# Patient Record
Sex: Female | Born: 1955
Health system: Southern US, Community
[De-identification: ages and names within clinical notes are randomized; demographics above are authoritative.]

## PROBLEM LIST (undated history)

## (undated) DIAGNOSIS — E119 Type 2 diabetes mellitus without complications: Secondary | ICD-10-CM

## (undated) DIAGNOSIS — I10 Essential (primary) hypertension: Secondary | ICD-10-CM

## (undated) DIAGNOSIS — E559 Vitamin D deficiency, unspecified: Secondary | ICD-10-CM

## (undated) DIAGNOSIS — M1990 Unspecified osteoarthritis, unspecified site: Secondary | ICD-10-CM

## (undated) DIAGNOSIS — K219 Gastro-esophageal reflux disease without esophagitis: Secondary | ICD-10-CM

## (undated) DIAGNOSIS — G4700 Insomnia, unspecified: Secondary | ICD-10-CM

## (undated) DIAGNOSIS — T783XXA Angioneurotic edema, initial encounter: Secondary | ICD-10-CM

## (undated) DIAGNOSIS — D649 Anemia, unspecified: Secondary | ICD-10-CM

## (undated) DIAGNOSIS — R05 Cough: Secondary | ICD-10-CM

## (undated) DIAGNOSIS — D869 Sarcoidosis, unspecified: Secondary | ICD-10-CM

## (undated) DIAGNOSIS — J069 Acute upper respiratory infection, unspecified: Secondary | ICD-10-CM

## (undated) DIAGNOSIS — H269 Unspecified cataract: Secondary | ICD-10-CM

## (undated) DIAGNOSIS — J329 Chronic sinusitis, unspecified: Secondary | ICD-10-CM

## (undated) DIAGNOSIS — H409 Unspecified glaucoma: Secondary | ICD-10-CM

## (undated) DIAGNOSIS — R059 Cough, unspecified: Secondary | ICD-10-CM

## (undated) DIAGNOSIS — E7800 Pure hypercholesterolemia, unspecified: Secondary | ICD-10-CM

## (undated) HISTORY — DX: Type 2 diabetes mellitus without complications: E11.9

## (undated) HISTORY — DX: Unspecified cataract: H26.9

## (undated) HISTORY — DX: Angioneurotic edema, initial encounter: T78.3XXA

## (undated) HISTORY — DX: Acute upper respiratory infection, unspecified: J06.9

## (undated) HISTORY — DX: Sarcoidosis, unspecified: D86.9

## (undated) HISTORY — DX: Cough, unspecified: R05.9

## (undated) HISTORY — DX: Essential (primary) hypertension: I10

## (undated) HISTORY — DX: Gastro-esophageal reflux disease without esophagitis: K21.9

## (undated) HISTORY — PX: NOSE SURGERY: SHX723

## (undated) HISTORY — DX: Unspecified osteoarthritis, unspecified site: M19.90

## (undated) HISTORY — DX: Anemia, unspecified: D64.9

## (undated) HISTORY — DX: Insomnia, unspecified: G47.00

## (undated) HISTORY — DX: Vitamin D deficiency, unspecified: E55.9

## (undated) HISTORY — DX: Pure hypercholesterolemia, unspecified: E78.00

## (undated) HISTORY — DX: Unspecified glaucoma: H40.9

## (undated) HISTORY — DX: Chronic sinusitis, unspecified: J32.9

## (undated) HISTORY — PX: SINOSCOPY: SHX187

## (undated) HISTORY — PX: VESICOVAGINAL FISTULA CLOSURE W/ TAH: SUR271

## (undated) HISTORY — DX: Cough: R05

## (undated) HISTORY — PX: TUBAL LIGATION: SHX77

## (undated) HISTORY — PX: COLONOSCOPY: SHX174

## (undated) HISTORY — PX: LYMPHADENECTOMY: SHX15

---

## 1999-05-22 ENCOUNTER — Other Ambulatory Visit: Admission: RE | Admit: 1999-05-22 | Discharge: 1999-05-22 | Payer: Self-pay | Admitting: Obstetrics and Gynecology

## 1999-09-29 ENCOUNTER — Encounter: Payer: Self-pay | Admitting: Obstetrics and Gynecology

## 1999-09-29 ENCOUNTER — Encounter: Admission: RE | Admit: 1999-09-29 | Discharge: 1999-09-29 | Payer: Self-pay | Admitting: Obstetrics and Gynecology

## 1999-10-10 ENCOUNTER — Ambulatory Visit: Admission: RE | Admit: 1999-10-10 | Discharge: 1999-10-10 | Payer: Self-pay | Admitting: Pulmonary Disease

## 2001-08-29 ENCOUNTER — Other Ambulatory Visit: Admission: RE | Admit: 2001-08-29 | Discharge: 2001-08-29 | Payer: Self-pay | Admitting: Obstetrics and Gynecology

## 2003-01-30 ENCOUNTER — Other Ambulatory Visit: Admission: RE | Admit: 2003-01-30 | Discharge: 2003-01-30 | Payer: Self-pay | Admitting: *Deleted

## 2004-01-24 ENCOUNTER — Other Ambulatory Visit: Admission: RE | Admit: 2004-01-24 | Discharge: 2004-01-24 | Payer: Self-pay | Admitting: *Deleted

## 2005-07-30 ENCOUNTER — Other Ambulatory Visit: Admission: RE | Admit: 2005-07-30 | Discharge: 2005-07-30 | Payer: Self-pay | Admitting: *Deleted

## 2006-07-12 ENCOUNTER — Other Ambulatory Visit: Admission: RE | Admit: 2006-07-12 | Discharge: 2006-07-12 | Payer: Self-pay | Admitting: *Deleted

## 2006-07-14 ENCOUNTER — Ambulatory Visit: Payer: Self-pay | Admitting: Emergency Medicine

## 2006-08-04 ENCOUNTER — Ambulatory Visit: Payer: Self-pay | Admitting: Emergency Medicine

## 2006-08-09 ENCOUNTER — Ambulatory Visit: Payer: Self-pay | Admitting: Emergency Medicine

## 2006-08-12 ENCOUNTER — Ambulatory Visit: Payer: Self-pay | Admitting: Cardiology

## 2007-03-01 ENCOUNTER — Ambulatory Visit: Payer: Self-pay | Admitting: Internal Medicine

## 2007-06-21 ENCOUNTER — Encounter: Payer: Self-pay | Admitting: Emergency Medicine

## 2007-07-07 ENCOUNTER — Ambulatory Visit: Payer: Self-pay | Admitting: Emergency Medicine

## 2007-07-07 LAB — CONVERTED CEMR LAB
ALT: 29 units/L (ref 0–35)
AST: 34 units/L (ref 0–37)
Albumin: 3.9 g/dL (ref 3.5–5.2)
Alkaline Phosphatase: 117 units/L (ref 39–117)
BUN: 8 mg/dL (ref 6–23)
CO2: 31 meq/L (ref 19–32)
Calcium: 9.6 mg/dL (ref 8.4–10.5)
Chloride: 105 meq/L (ref 96–112)
Creatinine, Ser: 0.8 mg/dL (ref 0.4–1.2)
GFR calc Af Amer: 98 mL/min
GFR calc non Af Amer: 81 mL/min
Glucose, Bld: 98 mg/dL (ref 70–99)
Potassium: 4.4 meq/L (ref 3.5–5.1)
Sodium: 141 meq/L (ref 135–145)
Total Bilirubin: 0.7 mg/dL (ref 0.3–1.2)
Total CK: 73 units/L (ref 7–177)
Total Protein: 7.8 g/dL (ref 6.0–8.3)

## 2007-08-01 ENCOUNTER — Other Ambulatory Visit: Admission: RE | Admit: 2007-08-01 | Discharge: 2007-08-01 | Payer: Self-pay | Admitting: *Deleted

## 2007-08-25 DIAGNOSIS — K5790 Diverticulosis of intestine, part unspecified, without perforation or abscess without bleeding: Secondary | ICD-10-CM

## 2007-08-25 HISTORY — DX: Diverticulosis of intestine, part unspecified, without perforation or abscess without bleeding: K57.90

## 2007-08-25 HISTORY — PX: COLONOSCOPY: SHX174

## 2007-09-27 ENCOUNTER — Encounter: Payer: Self-pay | Admitting: Emergency Medicine

## 2008-03-30 ENCOUNTER — Encounter: Payer: Self-pay | Admitting: Internal Medicine

## 2008-04-09 ENCOUNTER — Ambulatory Visit: Payer: Self-pay | Admitting: Internal Medicine

## 2008-04-23 ENCOUNTER — Ambulatory Visit: Payer: Self-pay | Admitting: Internal Medicine

## 2008-07-10 ENCOUNTER — Ambulatory Visit: Payer: Self-pay | Admitting: Emergency Medicine

## 2008-07-10 DIAGNOSIS — D869 Sarcoidosis, unspecified: Secondary | ICD-10-CM | POA: Insufficient documentation

## 2008-07-10 LAB — CONVERTED CEMR LAB: Angiotensin 1 Converting Enzyme: 100 units/L — ABNORMAL HIGH (ref 9–67)

## 2008-07-11 ENCOUNTER — Encounter: Payer: Self-pay | Admitting: Emergency Medicine

## 2008-07-13 LAB — CONVERTED CEMR LAB
ALT: 22 units/L (ref 0–35)
AST: 28 units/L (ref 0–37)
Albumin: 3.6 g/dL (ref 3.5–5.2)
Alkaline Phosphatase: 75 units/L (ref 39–117)
BUN: 11 mg/dL (ref 6–23)
Basophils Absolute: 0 10*3/uL (ref 0.0–0.1)
Basophils Relative: 0.4 % (ref 0.0–3.0)
Bilirubin, Direct: 0.1 mg/dL (ref 0.0–0.3)
CO2: 30 meq/L (ref 19–32)
Calcium: 9 mg/dL (ref 8.4–10.5)
Chloride: 103 meq/L (ref 96–112)
Creatinine, Ser: 0.9 mg/dL (ref 0.4–1.2)
Eosinophils Absolute: 0.2 10*3/uL (ref 0.0–0.7)
Eosinophils Relative: 4.2 % (ref 0.0–5.0)
GFR calc Af Amer: 85 mL/min
GFR calc non Af Amer: 70 mL/min
Glucose, Bld: 103 mg/dL — ABNORMAL HIGH (ref 70–99)
HCT: 36.6 % (ref 36.0–46.0)
Hemoglobin: 12.4 g/dL (ref 12.0–15.0)
Lymphocytes Relative: 22.6 % (ref 12.0–46.0)
MCHC: 33.7 g/dL (ref 30.0–36.0)
MCV: 84.5 fL (ref 78.0–100.0)
Monocytes Absolute: 0.6 10*3/uL (ref 0.1–1.0)
Monocytes Relative: 10.2 % (ref 3.0–12.0)
Neutro Abs: 3.7 10*3/uL (ref 1.4–7.7)
Neutrophils Relative %: 62.6 % (ref 43.0–77.0)
Platelets: 192 10*3/uL (ref 150–400)
Potassium: 3.6 meq/L (ref 3.5–5.1)
RBC: 4.33 M/uL (ref 3.87–5.11)
RDW: 13.2 % (ref 11.5–14.6)
Sodium: 138 meq/L (ref 135–145)
TSH: 1.75 microintl units/mL (ref 0.35–5.50)
Total Bilirubin: 0.6 mg/dL (ref 0.3–1.2)
Total CK: 43 units/L (ref 7–177)
Total Protein: 7 g/dL (ref 6.0–8.3)
WBC: 5.8 10*3/uL (ref 4.5–10.5)

## 2008-07-17 ENCOUNTER — Other Ambulatory Visit: Admission: RE | Admit: 2008-07-17 | Discharge: 2008-07-17 | Payer: Self-pay | Admitting: Gynecology

## 2010-05-22 ENCOUNTER — Ambulatory Visit: Payer: Self-pay | Admitting: Emergency Medicine

## 2010-06-16 ENCOUNTER — Ambulatory Visit: Payer: Self-pay | Admitting: Emergency Medicine

## 2010-06-16 DIAGNOSIS — R05 Cough: Secondary | ICD-10-CM | POA: Insufficient documentation

## 2010-06-19 ENCOUNTER — Encounter: Payer: Self-pay | Admitting: Emergency Medicine

## 2010-06-19 ENCOUNTER — Ambulatory Visit: Payer: Self-pay | Admitting: Internal Medicine

## 2010-07-16 ENCOUNTER — Telehealth: Payer: Self-pay | Admitting: Pulmonary Disease

## 2010-07-23 ENCOUNTER — Ambulatory Visit: Payer: Self-pay | Admitting: Emergency Medicine

## 2010-07-23 LAB — CONVERTED CEMR LAB
Cholesterol, target level: 200 mg/dL
HDL goal, serum: 40 mg/dL
LDL Goal: 160 mg/dL

## 2010-08-21 ENCOUNTER — Ambulatory Visit: Payer: Self-pay | Admitting: Emergency Medicine

## 2010-08-22 ENCOUNTER — Ambulatory Visit: Payer: Self-pay | Admitting: Cardiovascular Disease

## 2010-09-16 ENCOUNTER — Ambulatory Visit
Admission: RE | Admit: 2010-09-16 | Discharge: 2010-09-16 | Payer: Self-pay | Source: Home / Self Care | Attending: Emergency Medicine | Admitting: Emergency Medicine

## 2010-09-25 NOTE — Progress Notes (Signed)
Summary: cough  Phone Note Call from Patient Call back at [phone]   Caller: Patient Call For: byrum Summary of Call: pt would like prednisone for cough [facility] Initial call taken by: Rickard Patience,  July 16, 2010 3:19 PM  Follow-up for Phone Call        Pt saw Dr. Delton Coombes on 06-16-10 and was having dry cough then. [place] cough has gotten much worse since then. It is still a dry cough but it takes her breath away when she coughs and she has chest tightness. Pt states she is doing nasal spray as directed as well as nasal washes. She also stopped lisinopril. Pt requesting an rx fo rprednisone. She has an appt with RB on 07-23-10. Please advisre. Carron Curie CMA  July 16, 2010 3:32 PM   Additional Follow-up for Phone Call Additional follow up Details #1::        her cough is more likely from her chronic sinus disease than sarcoid, and it looks like DR. Byrum wished to hold off on steroids and to work on nasal hygiene regimen.  It takes 4-8 weeks for lisinopril to get out of her system.  Would like to hold off on prednisone, continue rinses and fluticasone nasal and try cough suppression. can take mucinex dm, and can also call in tessalon pearls 100mg  2 by mouth every 6 hrs if needed.  #30 and no fills.  let us know if not helping. Additional Follow-up by: Barbaraann Share MD,  July 16, 2010 4:13 PM    Additional Follow-up for Phone Call Additional follow up Details #2::    pt advised of all recs and rx sent to [facility]. Carron Curie CMA  July 16, 2010 4:30 PM   New/Updated Medications: TESSALON PERLES 100 MG CAPS (BENZONATATE) 2 by mouth every 6 hours as needed Prescriptions: TESSALON PERLES 100 MG CAPS (BENZONATATE) 2 by mouth every 6 hours as needed  #30 x 0   Entered by:   Carron Curie CMA   Authorized by:   Barbaraann Share MD   Signed by:   Carron Curie CMA on 07/16/2010   Method used:   Electronically to      [facility] [phone]* (retail)       [address]       [place], [place]  [address]       Ph: [phone] or [phone]       Fax: [phone]   RxID:   [id number]

## 2010-09-25 NOTE — Miscellaneous (Signed)
Summary: Orders Update pft charges  Clinical Lists Changes  Orders: Added new Service order of Carbon Monoxide diffusing w/capacity (94720) - Signed Added new Service order of Lung Volumes (94240) - Signed Added new Service order of Spirometry (Pre & Post) (94060) - Signed 

## 2010-09-25 NOTE — Assessment & Plan Note (Signed)
Summary: sarcoidosis, sinusitis   Visit Type:  Follow-up Primary Provider/Referring Provider:  Sheryle Hail [facility], [name]  CC:  CT chest follow-up...pt c/o RUQ pain and thick yellow sputum w/ her cough...hoarseness...low grade fever that comes and goes.  History of Present Illness: [name] is a very pleasant 55 year old woman with a history of stable sarcoidosis. Last seen 11/08. She is not on maintanance meds for sarcoidosis. Since last visit has had sinus surgery for sarcoid involvement [name]). Post-op course comp by MRSA, treated for eradication. Tricor started in 8/09.   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   [name] 06/16/10 -- 55 yo woman, sarcoidosis. Has had recurrence of her dry cough, a tickle in throat. Frequent throat clearing. Has allergy symptoms, clear congestion. Does NSW occasionally. Not on an allergy regimen. PFT's done today, FEV1 and FVC down slightly from 2007.   [name] 07/23/10 -- Sarcoidosis, allergies/chronic cough, sinus disease (from sarcoid). Stopped lisinopril last time (2 mo ago). Tessalon Perles called in but she hasn't picked up. Continues to cough, has been doing so for a year!. She started nasal saline washes and fluticasone spray.   [name] 08/21/10 -- returns for sarcoidosis and cough. last time I treated her with prednisone for her progressive symptoms and cavitary changes on CT scan of the chest. She began to feel better on th epred - more energy, a bit less cough. She did have insomnia w the pred. Since 12/24 has had URI symptoms, lost her voice. Some relief from alka-seltzer plus, mucinex. Also on fluticasone spray and NSW  two times a day.   [name] 09/16/09 -- sarcoidosis, abnormal CT scan. repeated 12/30. She has had persistent cough since last time, still w hoarse voice, globus sensation. Doing NSW's. Greenish/yellow stuff nasal drainage. Breathing is stable.   Preventive Screening-Counseling & Management  Alcohol-Tobacco  Smoking Status: never  Current Medications (verified): 1)  Centrum Silver  Tabs (Multiple Vitamins-Minerals) .... Take 1 Tablet By Mouth Once A Day 2)  Vitamin C Cr 1000 Mg Cr-Tabs (Ascorbic Acid) .... Take 1 Tablet By Mouth Once A Day 3)  Losartan Potassium 50 Mg Tabs (Losartan Potassium) .Marland Kitchen.. 1 By Mouth Once Daily 4)  Pravastatin Sodium 20 Mg Tabs (Pravastatin Sodium) .Marland Kitchen.. 1 By Mouth Daily 5)  Fluticasone Propionate 50 Mcg/act Susp (Fluticasone Propionate) .... 2 Sprays Each Nostril Two Times A Day 6)  Alka-Seltzer Plus Cold 2-7.8-325 Mg Tbef (Chlorphen-Phenyleph-Asa) .... Per Box Directions As Needed 7)  Mucinex 600 Mg Xr12h-Tab (Guaifenesin) .... Per Box Directions As Needed 8)  Delsym 30 Mg/79ml Lqcr (Dextromethorphan Polistirex) .Marland Kitchen.. 1 Tsp Two Times A Day As Needed 9)  Aleve 220 Mg Tabs (Naproxen Sodium) .Marland Kitchen.. 1 By Mouth Daily As Needed  Allergies (verified): No Known Drug Allergies  Vital Signs:  Patient profile:   55 year old female Height:      65 inches (165.10 cm) Weight:      157 pounds (71.36 kg) BMI:     26.22 O2 Sat:      97 % on Room air Temp:     98.1 degrees F (36.72 degrees C) oral Pulse rate:   98 / minute BP sitting:   124 / 78  (left arm) Cuff size:   regular  Vitals Entered By: [name] CMA (September 16, 2010 9:11 AM)  O2 Sat at Rest %:  97 O2 Flow:  Room air CC: CT chest follow-up...pt c/o RUQ pain and thick yellow sputum w/ her cough...hoarseness...low grade fever that  comes and goes Comments Medications reviewed with patient [name] CMA  September 16, 2010 9:12 AM   Physical Exam  General:  normal appearance and healthy appearing. Hoarse voice Head:  normocephalic and atraumatic Eyes:  conjunctiva and sclera clear Nose:  no deformity, discharge, inflammation, or lesions Mouth:  no deformity or lesions Neck:  no masses, thyromegaly, or abnormal cervical nodes Lungs:  R basilar insp crackles Heart:  regular rate and rhythm, S1, S2 without  murmurs, rubs, gallops, or clicks Abdomen:  not examined Msk:  no deformity or scoliosis noted with normal posture Extremities:  no clubbing, cyanosis, edema, or deformity noted Neurologic:  non-focal Skin:  no rash to suggest sarcoid Psych:  alert and cooperative; normal mood and affect; normal attention span and concentration, but she c/o some difficulty with memory   CT of Chest  Procedure date:  08/22/2010  Findings:      IMPRESSION:   1.  Migratory right upper lobe air space opacities surrounding preexisting cavitary lesions.  The airspace disease surrounding the apical component has improved, but there is worsening air space disease surrounding the more inferolateral component. 2.  In this patient with known sarcoidosis, findings could be related to pulmonary granulomatous angiitis or superimposed atypical infection, including fungal or pneumocystis if the patient is immunocompromised. 3.  Grossly stable mediastinal and hilar partially calcified adenopathy.  Impression & Recommendations:  Problem # 1:  SARCOIDOSIS (ICD-135) - CT scan with more inflammation surrounding RML cavity. The rUL cavities appear to have cleared.  - pred x 3 more weeks then taper - consider FOB if no resolution, ? opportunistic infxn  Problem # 2:  COUGH (ICD-786.2)  ? component of chronic sinusitis.  - continue NSW - fluticasone  - 6 weeks clinda  Orders: Est. Patient Level IV (52841)  Medications Added to Medication List This Visit: 1)  Delsym 30 Mg/9ml Lqcr (Dextromethorphan polistirex) .Marland Kitchen.. 1 tsp two times a day as needed 2)  Aleve 220 Mg Tabs (Naproxen sodium) .Marland Kitchen.. 1 by mouth daily as needed 3)  Prednisone 20 Mg Tabs (Prednisone) .... 40mg  once daily x 3 weeks, then taper: 20mg  once daily x 3 days, then 10mg  once daily x 3 days then stop 4)  Cleocin 300 Mg Caps (Clindamycin hcl) .Marland Kitchen.. 1 by mouth q6h x 6 weeks  Patient Instructions: 1)  We will start prednisone 40mg  once daily for  the next 3 weeks, then decrease to 20mg  x 3 days, then 10mg  x 3 days, then stop 2)  Take clindamycin for the next 6 weeks 3)  Follow up with Dr Delton Coombes in 6 weeks to review your status.  Prescriptions: CLEOCIN 300 MG CAPS (CLINDAMYCIN HCL) 1 by mouth q6h x 6 weeks  #168 x 0   Entered and Authorized by:   Leslye Peer MD   Signed by:   Leslye Peer MD on 09/16/2010   Method used:   Electronically to        [facility]  [phone]* (retail)       [address]       [place], [place]  [address]       Ph: [phone] or [phone]       Fax: [phone]   RxID:   [id number] PREDNISONE 20 MG TABS (PREDNISONE) 40mg  once daily x 3 weeks, then taper: 20mg  once daily x 3 days, then 10mg  once daily x 3 days then stop  #48 x 2  Entered and Authorized by:   Leslye Peer MD   Signed by:   Leslye Peer MD on 09/16/2010   Method used:   Electronically to        [facility]  [phone]* (retail)       [address]       [place], [place]  [address]       Ph: [phone] or [phone]       Fax: [phone]   RxID:   [id number]

## 2010-09-25 NOTE — Assessment & Plan Note (Signed)
Summary: sarcoidosis, cough   Visit Type:  Follow-up Primary Provider/Referring Provider:  Sheryle Hail [facility], Barney Drain  CC:  Sarcoid follow-up with PFT...the patient c/o increased SOB with exertion...fatigue...cough is back.  History of Present Illness: [name] is a very pleasant 55 year old woman with a history of stable sarcoidosis. Last seen 11/08. She is not on maintanance meds for sarcoidosis. Since last visit has had sinus surgery for sarcoid involvement. Post-op course comp by MRSA, treated for eradication. Tricor started in 8/09.   Has been having low energy, some nausea in the morning when she gets up for several months. Then more signs including some chest tightness, becoming worse over the last 3 weeks. She has treated this with Tums unsuccessfully. Has been coughing for the entire time, started to worsen around the same time, seems to be related to nasal gtt. No purulent drainage now - does NSW's. She has also noticed that her memory has worsened since last visit. No new rash. Sleeping more than usual.   [name] 05/22/10 -- 55 yo woman, hx of sarcoidosis (by axillary bx) with sinus and lung manifestations. Hasn't been seen since 2009. Has been dealing with longstanding cough, sinus drainage yellowish, sputum thick and yellow as well, feels that it is draining down her throat. Feels more tired. Treated recently with azithromycin, pred taper and given albuterol for presumed bronchitis, now improved. Pred to end tomorrow. 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   Pulmonary function testing was performed on August 04, 2006.  This showed an FVC of 2.84 or 82% of predicted, an FEV1 of 2.29 or 86% of predicted and a ratio of 80%.  There was no bronchodilator responsiveness.  Her lung volumes were normal.  Her diffusion capacity was low at 59% of predicted but corrected to the normal range when adjusted for albuterol volume.  [name] 06/16/10 -- 55 yo woman,  sarcoidosis. Has had recurrence of her dry cough, a tickle in throat. Frequent throat clearing. Has allergy symptoms, clear congestion. Does NSW occasionally. Not on an allergy regimen. PFT's done today, FEV1 and FVC down slightly from 2007.   Current Medications (verified): 1)  Centrum Silver  Tabs (Multiple Vitamins-Minerals) .... Take 1 Tablet By Mouth Once A Day 2)  Vitamin C Cr 1000 Mg Cr-Tabs (Ascorbic Acid) .... Take 1 Tablet By Mouth Once A Day 3)  Lisinopril 10 Mg Tabs (Lisinopril) .Marland Kitchen.. 1 By Mouth Daily 4)  Pravastatin Sodium 20 Mg Tabs (Pravastatin Sodium) .Marland Kitchen.. 1 By Mouth Daily 5)  Delsym 30 Mg/38ml Lqcr (Dextromethorphan Polistirex) .... As Needed For Cough  Allergies (verified): No Known Drug Allergies  Vital Signs:  Patient profile:   55 year old female Height:      65 inches (165.10 cm) Weight:      154 pounds (70 kg) BMI:     25.72 O2 Sat:      96 % on Room air Temp:     98.1 degrees F (36.72 degrees C) oral Pulse rate:   77 / minute BP sitting:   152 / 86  (right arm) Cuff size:   regular  Vitals Entered By: Michel Bickers CMA (June 16, 2010 4:16 PM)  O2 Sat at Rest %:  96 O2 Flow:  Room air CC: Sarcoid follow-up with PFT...the patient c/o increased SOB with exertion...fatigue...cough is back Comments Medications reviewed with patient Michel Bickers [facility]  June 16, 2010 4:33 PM   Physical Exam  General:  normal appearance and healthy appearing.  Freq  throat clearing Head:  normocephalic and atraumatic Eyes:  conjunctiva and sclera clear Nose:  no deformity, discharge, inflammation, or lesions Mouth:  no deformity or lesions Neck:  no masses, thyromegaly, or abnormal cervical nodes Lungs:  R basilar insp crackles Heart:  regular rate and rhythm, S1, S2 without murmurs, rubs, gallops, or clicks Abdomen:  not examined Msk:  no deformity or scoliosis noted with normal posture Extremities:  no clubbing, cyanosis, edema, or deformity noted Neurologic:   non-focal Skin:  no rash to suggest sarcoid Psych:  alert and cooperative; normal mood and affect; normal attention span and concentration, but she c/o some difficulty with memory   Pulmonary Function Test Date: 06/16/2010 Height (in.): 64 Gender: Female  Pre-Spirometry FVC    Value: 2.47 L/min   Pred: 3.38 L/min     % Pred: 73 % FEV1    Value: 3.76 L     Pred: 5.16 L     % Pred: 73 % FEV1/FVC  Value: 84 %     Pred: 75 %    FEF 25-75  Value: 2.55 L/min   Pred: 2.89 L/min     % Pred: 89 %  Post-Spirometry FVC    Value: 2.43 L/min   Pred: 3.38 L/min     % Pred: 72 % FEV1    Value: 2.00 L     Pred: 2.55 L     % Pred: 78 % FEV1/FVC  Value: 82 %     Pred: 75 %    FEF 25-75  Value: 2.35 L/min   Pred: 2.89 L/min     % Pred: 81 %  Lung Volumes TLC    Value: 3.76 L   % Pred: 73 % RV    Value: 1.29 L   % Pred: 69 % DLCO    Value: 9.6 %   % Pred: 44 % DLCO/VA  Value: 3.18 %   % Pred: 80 %  Impression & Recommendations:  Problem # 1:  SARCOIDOSIS (ICD-135) CT scan chest Hold off on pred or restarting Symbicort at this time.   Problem # 2:  COUGH (ICD-786.2) Start doing your nasal saline washes every day Start fluticasone 2 sprays each side two times a day  We will stop your lisinopril and start losartan 50mg  by mouth once daily  Follow up with Dr Delton Coombes in 1 month to review  Medications Added to Medication List This Visit: 1)  Losartan Potassium 50 Mg Tabs (Losartan potassium) .Marland Kitchen.. 1 by mouth once daily 2)  Pravastatin Sodium 20 Mg Tabs (Pravastatin sodium) .Marland Kitchen.. 1 by mouth daily 3)  Delsym 30 Mg/42ml Lqcr (Dextromethorphan polistirex) .... As needed for cough 4)  Fluticasone Propionate 50 Mcg/act Susp (Fluticasone propionate) .... 2 sprays each nostril two times a day  Other Orders: Est. Patient Level IV (16109) Radiology Referral (Radiology)  Patient Instructions: 1)  Start doing your nasal saline washes every day 2)  Start fluticasone 2 sprays each side two times a day  3)  We  will stop your lisinopril and start losartan 50mg  by mouth once daily  4)  We will perform a CT scan of your chest  5)  Follow up with Dr Delton Coombes in 1 month to review Prescriptions: FLUTICASONE PROPIONATE 50 MCG/ACT SUSP (FLUTICASONE PROPIONATE) 2 sprays each nostril two times a day  #1 x 5   Entered and Authorized by:   Leslye Peer MD   Signed by:   Leslye Peer MD on 06/16/2010   Method  used:   Electronically to        [facility]  [phone]* (retail)       [address]       [place], [place]  [address]       Ph: [phone] or [phone]       Fax: [phone]   RxID:   [id number] LOSARTAN POTASSIUM 50 MG TABS (LOSARTAN POTASSIUM) 1 by mouth once daily  #30 x 5   Entered and Authorized by:   Leslye Peer MD   Signed by:   Leslye Peer MD on 06/16/2010   Method used:   Electronically to        [facility]  [phone]* (retail)       [address]       [place], [place]  [address]       Ph: [phone] or [phone]       Fax: [phone]   RxID:   [id number]

## 2010-09-25 NOTE — Assessment & Plan Note (Signed)
Summary: sarcoidosis   Visit Type:  Follow-up Primary Provider/Referring Provider:  Sheryle Hail [facility], Barney Drain  CC:  Followup sarcoid- last seen Nov 2009.  Pt c/o cough x several months- dry and hard to produce sputum- started on prednisone taper and states this has helped.  She states that since last seen has had some changes in vision- "lights flashing" and floaters- sees [facility] for [facility].  .  History of Present Illness: [name] is a very pleasant 55 year old woman with a history of stable sarcoidosis. Last seen 11/08. She is not on maintanance meds for sarcoidosis. Since last visit has had sinus surgery for sarcoid involvement. Post-op course comp by MRSA, treated for eradication. Tricor started in 8/09.   Has been having low energy, some nausea in the morning when she gets up for several months. Then more signs including some chest tightness, becoming worse over the last 3 weeks. She has treated this with Tums unsuccessfully. Has been coughing for the entire time, started to worsen around the same time, seems to be related to nasal gtt. No purulent drainage now - does NSW's. She has also noticed that her memory has worsened since last visit. No new rash. Sleeping more than usual.   [name] 05/22/10 -- 55 yo woman, hx of sarcoidosis (by axillary bx) with sinus and lung manifestations. Hasn't been seen since 2009. Has been dealing with longstanding cough, sinus drainage yellowish, sputum thick and yellow as well, feels that it is draining down her throat. Feels more tired. Treated recently with azithromycin, pred taper and given albuterol for presumed bronchitis, now improved. Pred to end tomorrow. 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   Pulmonary function testing was performed on August 04, 2006.  This showed an FVC of 2.84 or 82% of predicted, an FEV1 of 2.29 or 86% of predicted and a ratio of 80%.  There was no bronchodilator responsiveness.  Her lung volumes  were normal.  Her diffusion capacity was low at 59% of predicted but corrected to the normal range when adjusted for albuterol volume.  Current Medications (verified): 1)  Centrum Silver  Tabs (Multiple Vitamins-Minerals) .... Take 1 Tablet By Mouth Once A Day 2)  Vitamin C Cr 1000 Mg Cr-Tabs (Ascorbic Acid) .... Take 1 Tablet By Mouth Once A Day 3)  Cod Liver Oil 1000 Mg Caps (Cod Liver Oil) .... Take 1 Tablet By Mouth Once A Day 4)  Prednisone 10 Mg Tabs (Prednisone) .... Tapered Dose As Directed 5)  Lisinopril (? Strength) .Marland Kitchen.. 1 Once Daily 6)  Pravastatin (? Strength) .Marland Kitchen.. 1 Once Daily  Allergies (verified): No Known Drug Allergies  Vital Signs:  Patient profile:   55 year old female Height:      65 inches Weight:      157 pounds BMI:     26.22 O2 Sat:      99 % on Room air Temp:     98.6 degrees F oral Pulse rate:   69 / minute BP sitting:   130 / 92  (left arm)  Vitals Entered By: Vernie Murders (May 22, 2010 2:54 PM)  O2 Flow:  Room air  Physical Exam  General:  normal appearance and healthy appearing.   Head:  normocephalic and atraumatic Eyes:  conjunctiva and sclera clear Nose:  no deformity, discharge, inflammation, or lesions Mouth:  no deformity or lesions Neck:  no masses, thyromegaly, or abnormal cervical nodes Lungs:  mostly clear, decreased at bases Heart:  regular rate and rhythm, S1, S2 without murmurs, rubs, gallops, or clicks Abdomen:  not examined Msk:  no deformity or scoliosis noted with normal posture Extremities:  no clubbing, cyanosis, edema, or deformity noted Neurologic:  non-focal Skin:  no rash to suggest sarcoid Psych:  alert and cooperative; normal mood and affect; normal attention span and concentration, but she c/o some difficulty with memory   Impression & Recommendations:  Problem # 1:  SARCOIDOSIS (ICD-135) - CXR today - full PFT  -depending on the above will decide whwther we need longer course pred, further imaging (was  going to get High res CT last time but never done) - rov next available - annual eye exam  Medications Added to Medication List This Visit: 1)  Prednisone 10 Mg Tabs (Prednisone) .... Tapered dose as directed 2)  Lisinopril (? Strength)  .Marland Kitchen.. 1 once daily 3)  Pravastatin (? Strength)  .Marland Kitchen.. 1 once daily  Other Orders: T-2 View CXR (71020TC) Est. Patient Level IV (16109)  Patient Instructions: 1)  We will check a CXR today 2)  Full PFTs at the time of your next visit 3)  Follow up with Dr Delton Coombes next available.

## 2010-09-25 NOTE — Assessment & Plan Note (Signed)
Summary: sarcoid, cough   Visit Type:  Follow-up Primary Provider/Referring Provider:  Sheryle Hail [facility], Barney Drain  CC:  Sarcoid...cough...the pt says her cough may be slightly worse...non-prod and Lipid Management.  History of Present Illness: Ms. Marksberry is a very pleasant 55 year old woman with a history of stable sarcoidosis. Last seen 11/08. She is not on maintanance meds for sarcoidosis. Since last visit has had sinus surgery for sarcoid involvement. Post-op course comp by MRSA, treated for eradication. Tricor started in 8/09.   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   [name] 06/16/10 -- 55 yo woman, sarcoidosis. Has had recurrence of her dry cough, a tickle in throat. Frequent throat clearing. Has allergy symptoms, clear congestion. Does NSW occasionally. Not on an allergy regimen. PFT's done today, FEV1 and FVC down slightly from 2007.   [name] 07/23/10 -- Sarcoidosis, allergies/chronic cough, sinus disease (from sarcoid). Stopped lisinopril last time (2 mo ago). Tessalon Perles called in but she hasn't picked up. Continues to cough, has ben doing so for a year!. She started nasal saline washes and fluticasone spray.   Lipid Management History:      Negative NCEP/ATP III risk factors include female age less than 54 years old and non-tobacco-user status.    Preventive Screening-Counseling & Management  Alcohol-Tobacco     Smoking Status: never  Current Medications (verified): 1)  Centrum Silver  Tabs (Multiple Vitamins-Minerals) .... Take 1 Tablet By Mouth Once A Day 2)  Vitamin C Cr 1000 Mg Cr-Tabs (Ascorbic Acid) .... Take 1 Tablet By Mouth Once A Day 3)  Losartan Potassium 50 Mg Tabs (Losartan Potassium) .Marland Kitchen.. 1 By Mouth Once Daily 4)  Pravastatin Sodium 20 Mg Tabs (Pravastatin Sodium) .Marland Kitchen.. 1 By Mouth Daily 5)  Delsym 30 Mg/63ml Lqcr (Dextromethorphan Polistirex) .... As Needed For Cough 6)  Fluticasone Propionate 50 Mcg/act Susp (Fluticasone Propionate) ....  2 Sprays Each Nostril Two Times A Day 7)  Tessalon Perles 100 Mg Caps (Benzonatate) .... 2 By Mouth Every 6 Hours As Needed  Allergies (verified): No Known Drug Allergies  Vital Signs:  Patient profile:   55 year old female Height:      65 inches (165.10 cm) Weight:      151.50 pounds (68.86 kg) BMI:     25.30 O2 Sat:      97 % on Room air Temp:     98.2 degrees F (36.78 degrees C) oral Pulse rate:   77 / minute BP sitting:   108 / 70  (left arm) Cuff size:   regular  Vitals Entered By: Michel Bickers CMA (July 23, 2010 4:14 PM)  O2 Sat at Rest %:  97 O2 Flow:  Room air CC: Sarcoid...cough...the pt says her cough may be slightly worse...non-prod, Lipid Management Comments Medications reviewed with patient Michel Bickers CMA  July 23, 2010 4:15 PM   Physical Exam  General:  normal appearance and healthy appearing.  Freq throat clearing Head:  normocephalic and atraumatic Eyes:  conjunctiva and sclera clear Nose:  no deformity, discharge, inflammation, or lesions Mouth:  no deformity or lesions Neck:  no masses, thyromegaly, or abnormal cervical nodes Lungs:  R basilar insp crackles Heart:  regular rate and rhythm, S1, S2 without murmurs, rubs, gallops, or clicks Abdomen:  not examined Msk:  no deformity or scoliosis noted with normal posture Extremities:  no clubbing, cyanosis, edema, or deformity noted Neurologic:  non-focal Skin:  no rash to suggest sarcoid Psych:  alert and cooperative; normal mood  and affect; normal attention span and concentration, but she c/o some difficulty with memory   Impression & Recommendations:  Problem # 1:  SARCOIDOSIS (ICD-135) cavitary disease on CT scan - will treat w pred burst, follow clinically and likely repeat CT scan  - if she declines or if no response then she will need FOB with washes and bx  Medications Added to Medication List This Visit: 1)  Prednisone 20 Mg Tabs (Prednisone) .... 40mg  by mouth once daily x 3 weeks,  then 20mg  by mouth once daily x 1 week, then 10mg  by mouth once daily x 1 week then stop  Other Orders: Est. Patient Level IV ([address]109)  Lipid Assessment/Plan:      Based on NCEP/ATP III, the patient's risk factor category is "0-1 risk factors".  The patient's lipid goals are as follows: Total cholesterol goal is 200; LDL cholesterol goal is 160; HDL cholesterol goal is 40; Triglyceride goal is 150.     Patient Instructions: 1)  Please take prednisone as directed 2)  Follow up with Dr Delton Coombes in 3 -4 weeks. At that time we will review your symptoms and CXR. Depending on your status we will decide whether you need a repeat Ct scan of the chest or any further evaluation.  Prescriptions: PREDNISONE 20 MG TABS (PREDNISONE) 40mg  by mouth once daily x 3 weeks, then 20mg  by mouth once daily x 1 week, then 10mg  by mouth once daily x 1 week then stop  #55 x 0   Entered and Authorized by:   Leslye Peer MD   Signed by:   Leslye Peer MD on 07/23/2010   Method used:   Electronically to        [facility]  [phone]* (retail)       [address]       [place], [place]  [address]       Ph: [phone] or [phone]       Fax: [phone]   RxID:   [id number]    Immunization History:  Influenza Immunization History:    Influenza:  historical (05/24/2010)

## 2010-09-25 NOTE — Assessment & Plan Note (Signed)
Summary: sarcoidosis, cough   Visit Type:  Follow-up Primary Provider/Referring Provider:  Sheryle Hail [facility], [place] Drain  CC:  4 wk followup sarcoid.  Pt c/o worsening cough x 6 days- prod with yellow sputum.  Chest sore from cough.  .  History of Present Illness: [name] is a very pleasant 55 year old woman with a history of stable sarcoidosis. Last seen 11/08. She is not on maintanance meds for sarcoidosis. Since last visit has had sinus surgery for sarcoid involvement. Post-op course comp by MRSA, treated for eradication. Tricor started in 8/09.   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   [name] 06/16/10 -- 55 yo woman, sarcoidosis. Has had recurrence of her dry cough, a tickle in throat. Frequent throat clearing. Has allergy symptoms, clear congestion. Does NSW occasionally. Not on an allergy regimen. PFT's done today, FEV1 and FVC down slightly from 2007.   [name] 07/23/10 -- Sarcoidosis, allergies/chronic cough, sinus disease (from sarcoid). Stopped lisinopril last time (2 mo ago). Tessalon Perles called in but she hasn't picked up. Continues to cough, has been doing so for a year!. She started nasal saline washes and fluticasone spray.   [name] 08/21/10 -- returns for sarcoidosis and cough. last time I treated her with prednisone for her progressive symptoms and cavitary changes on CT scan of the chest. She began to feel better on th epred - more energy, a bit less cough. She did have insomnia w the pred. Since 12/24 has had URI symptoms, lost her voice. Some relief from alka-seltzer plus, mucinex. Also on fluticasone spray and NSW  two times a day.   Current Medications (verified): 1)  Centrum Silver  Tabs (Multiple Vitamins-Minerals) .... Take 1 Tablet By Mouth Once A Day 2)  Vitamin C Cr 1000 Mg Cr-Tabs (Ascorbic Acid) .... Take 1 Tablet By Mouth Once A Day 3)  Losartan Potassium 50 Mg Tabs (Losartan Potassium) .Marland Kitchen.. 1 By Mouth Once Daily 4)  Pravastatin Sodium 20 Mg  Tabs (Pravastatin Sodium) .Marland Kitchen.. 1 By Mouth Daily 5)  Fluticasone Propionate 50 Mcg/act Susp (Fluticasone Propionate) .... 2 Sprays Each Nostril Two Times A Day 6)  Tessalon Perles 100 Mg Caps (Benzonatate) .... 2 By Mouth Every 6 Hours As Needed 7)  Prednisone 20 Mg Tabs (Prednisone) .... 40mg  By Mouth Once Daily X 3 Weeks, Then 20mg  By Mouth Once Daily X 1 Week, Then 10mg  By Mouth Once Daily X 1 Week Then Stop 8)  Alka-Seltzer Plus Cold 2-7.8-325 Mg Tbef (Chlorphen-Phenyleph-Asa) .... Per Box Directions As Needed 9)  Mucinex 600 Mg Xr12h-Tab (Guaifenesin) .... Per Box Directions As Needed  Allergies (verified): No Known Drug Allergies  Vital Signs:  Patient profile:   55 year old female Weight:      163 pounds O2 Sat:      100 % on Room air Temp:     98.3 degrees F oral Pulse rate:   76 / minute BP sitting:   142 / 96  (left arm)  Vitals Entered By: Vernie Murders (August 21, 2010 4:32 PM)  O2 Flow:  Room air  Physical Exam  General:  normal appearance and healthy appearing. Hoarse voice Head:  normocephalic and atraumatic Eyes:  conjunctiva and sclera clear Nose:  no deformity, discharge, inflammation, or lesions Mouth:  no deformity or lesions Neck:  no masses, thyromegaly, or abnormal cervical nodes Lungs:  R basilar insp crackles Heart:  regular rate and rhythm, S1, S2 without murmurs, rubs, gallops, or clicks Abdomen:  not examined  Msk:  no deformity or scoliosis noted with normal posture Extremities:  no clubbing, cyanosis, edema, or deformity noted Neurologic:  non-focal Skin:  no rash to suggest sarcoid Psych:  alert and cooperative; normal mood and affect; normal attention span and concentration, but she c/o some difficulty with memory   Impression & Recommendations:  Problem # 1:  SARCOIDOSIS (ICD-135) - repeat scan after pred finishes  Problem # 2:  COUGH (ICD-786.2) ? new URI _/- bronchitis - rx with doxy, symptomatic meds  Medications Added to  Medication List This Visit: 1)  Alka-seltzer Plus Cold 2-7.8-325 Mg Tbef (Chlorphen-phenyleph-asa) .... Per box directions as needed 2)  Mucinex 600 Mg Xr12h-tab (Guaifenesin) .... Per box directions as needed 3)  Doxycycline Hyclate 100 Mg Tabs (Doxycycline hyclate) .Marland Kitchen.. 1 by mouth two times a day  Other Orders: Est. Patient Level IV (04540) Radiology Referral (Radiology)  Patient Instructions: 1)  CT scan of the chest without contrast 2)  Take doxycycline prescription as directed 3)  Continue your nasal saline washes and symptomatic medications for your upper respiratory infection.  4)  Follow up in 2 -3 weeks to review your film.  Prescriptions: DOXYCYCLINE HYCLATE 100 MG TABS (DOXYCYCLINE HYCLATE) 1 by mouth two times a day  #14 x 0   Entered and Authorized by:   Leslye Peer MD   Signed by:   Leslye Peer MD on 08/21/2010   Method used:   Electronically to        [facility]  [phone]* (retail)       [address]       [place], [place]  [address]       Ph: [phone] or [phone]       Fax: [phone]   RxID:   [id number]

## 2010-11-03 ENCOUNTER — Other Ambulatory Visit: Payer: Self-pay | Admitting: Emergency Medicine

## 2010-11-03 ENCOUNTER — Ambulatory Visit (INDEPENDENT_AMBULATORY_CARE_PROVIDER_SITE_OTHER)
Admission: RE | Admit: 2010-11-03 | Discharge: 2010-11-03 | Disposition: A | Discharge status: Home / Self Care | Payer: Self-pay | Source: Ambulatory Visit | Attending: Emergency Medicine | Admitting: Emergency Medicine

## 2010-11-03 ENCOUNTER — Ambulatory Visit (INDEPENDENT_AMBULATORY_CARE_PROVIDER_SITE_OTHER): Payer: Self-pay | Admitting: Emergency Medicine

## 2010-11-03 ENCOUNTER — Encounter: Payer: Self-pay | Admitting: Emergency Medicine

## 2010-11-03 DIAGNOSIS — D869 Sarcoidosis, unspecified: Secondary | ICD-10-CM

## 2010-11-03 DIAGNOSIS — R05 Cough: Secondary | ICD-10-CM

## 2010-11-11 NOTE — Assessment & Plan Note (Signed)
Summary: sarcoidosis, cough   Visit Type:  Follow-up Primary Provider/Referring Provider:  Sheryle Hail [facility], [place] Drain  CC:  Sarcoid.  Pt says the cough is much better...mucus is still thick but clear...off Cleocin x2 weeks.  History of Present Illness: Ms. Cimino is a very pleasant 55 year old woman with a history of stable sarcoidosis. Last seen 11/08. She is not on maintanance meds for sarcoidosis. Since last visit has had sinus surgery for sarcoid involvement [name]). Post-op course comp by MRSA, treated for eradication. Tricor started in 8/09.  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   [name] 06/16/10 -- 55 yo woman, sarcoidosis. Has had recurrence of her dry cough, a tickle in throat. Frequent throat clearing. Has allergy symptoms, clear congestion. Does NSW occasionally. Not on an allergy regimen. PFT's done today, FEV1 and FVC down slightly from 2007.   [name] 07/23/10 -- Sarcoidosis, allergies/chronic cough, sinus disease (from sarcoid). Stopped lisinopril last time (2 mo ago). Tessalon Perles called in but she hasn't picked up. Continues to cough, has been doing so for a year!. She started nasal saline washes and fluticasone spray.   [name] 08/21/10 -- returns for sarcoidosis and cough. last time I treated her with prednisone for her progressive symptoms and cavitary changes on CT scan of the chest. She began to feel better on th epred - more energy, a bit less cough. She did have insomnia w the pred. Since 12/24 has had URI symptoms, lost her voice. Some relief from alka-seltzer plus, mucinex. Also on fluticasone spray and NSW  two times a day.   [name] 09/16/09 -- sarcoidosis, abnormal CT scan. repeated 12/30. She has had persistent cough since last time, still w hoarse voice, globus sensation. Doing NSW's. Greenish/yellow stuff nasal drainage. Breathing is stable.   [name] 11/03/10 -- sarcoidosis, changes on CT scan. Rx for suspected chronic sinusitis w clinda 4 wks,  mucous now clear, still drains. We also treated her with another round prednisone for more inflammation around UL cavity, now off. She is exercising more, cough is better.   Preventive Screening-Counseling & Management  Alcohol-Tobacco     Smoking Status: never  Current Medications (verified): 1)  Centrum Silver  Tabs (Multiple Vitamins-Minerals) .... Take 1 Tablet By Mouth Once A Day 2)  Vitamin C Cr 1000 Mg Cr-Tabs (Ascorbic Acid) .... Take 1 Tablet By Mouth Once A Day 3)  Losartan Potassium 50 Mg Tabs (Losartan Potassium) .Marland Kitchen.. 1 By Mouth Once Daily 4)  Pravastatin Sodium 20 Mg Tabs (Pravastatin Sodium) .Marland Kitchen.. 1 By Mouth Daily 5)  Fluticasone Propionate 50 Mcg/act Susp (Fluticasone Propionate) .... 2 Sprays Each Nostril Two Times A Day  Allergies (verified): No Known Drug Allergies  Vital Signs:  Patient profile:   55 year old female Height:      65 inches (165.10 cm) Weight:      166 pounds (75.45 kg) BMI:     27.72 O2 Sat:      96 % on Room air Temp:     97.9 degrees F (36.61 degrees C) oral Pulse rate:   83 / minute BP sitting:   132 / 90  (right arm) Cuff size:   regular  Vitals Entered By: Michel Bickers CMA (November 03, 2010 9:27 AM)  O2 Sat at Rest %:  96 O2 Flow:  Room air CC: Sarcoid.  Pt says the cough is much better...mucus is still thick but clear...off Cleocin x2 weeks Comments Medications reviewed with patient Michel Bickers [facility]  November 03, 2010 9:33 AM   Physical Exam  General:  normal appearance and healthy appearing. Hoarse voice Head:  normocephalic and atraumatic Eyes:  conjunctiva and sclera clear Nose:  no deformity, discharge, inflammation, or lesions Mouth:  no deformity or lesions Neck:  no masses, thyromegaly, or abnormal cervical nodes Lungs:  soft R basilar insp crackles Heart:  regular rate and rhythm, S1, S2 without murmurs, rubs, gallops, or clicks Abdomen:  not examined Msk:  no deformity or scoliosis noted with normal posture Extremities:  no  clubbing, cyanosis, edema, or deformity noted Neurologic:  non-focal Skin:  no rash to suggest sarcoid Psych:  alert and cooperative; normal mood and affect; normal attention span and concentration   Impression & Recommendations:  Problem # 1:  SARCOIDOSIS (ICD-135) finished rx pred and taper for continued pericavitary inflammation - repeat CXR and consider repeat CT scan soon depending on results, call w results  Problem # 2:  COUGH (ICD-786.2)  Treated for chronic sinusitus - better - NSW - nasal steroid - add loratedine once daily  Orders: Est. Patient Level IV ([address]109)  Medications Added to Medication List This Visit: 1)  Loratadine 10 Mg Tabs (Loratadine) .Marland Kitchen.. 1 by mouth once daily  Other Orders: T-2 View CXR (71020TC)  Patient Instructions: 1)  Start loratadine 10mg  once daily once daily  2)  Continue your nasal saline washes 3)  Continue fluticasone nasal spray two times a day  4)  We will perform a CXR today. Depending on these results and how you are feeling, we will decide when to perform a CT scan of the chest.  5)  Follow up with Dr Delton Coombes in 3 -4 months or as needed.  Prescriptions: LORATADINE 10 MG TABS (LORATADINE) 1 by mouth once daily  #30 x 11   Entered and Authorized by:   Leslye Peer MD   Signed by:   Leslye Peer MD on 11/03/2010   Method used:   Electronically to        [facility]  [phone]* (retail)       [address]       [place], [place]  [address]       Ph: [phone] or [phone]       Fax: [phone]   RxID:   [id number]

## 2011-01-06 NOTE — Assessment & Plan Note (Signed)
[facility]                             PULMONARY OFFICE NOTE   NAME:Rattigan-LINDENChanice, [name]                 MRN:          [id number]  DATE:03/01/2007                            DOB:          21-Feb-1956    HISTORY OF PRESENT ILLNESS:  The patient is a 55 year old African-  American female, patient of Dr. Delton Coombes who has a known history of  sarcoidosis.  It has been fairly stable since 2004 off of systemic  therapy.  The patient presents today for an acute office visit  complaining of a nasal congestion, sinus pain and pressure, fever and  dry cough.  The patient denies any hemoptysis, orthopnea, PND or leg  swelling.   PAST MEDICAL HISTORY:  Is reviewed.   CURRENT MEDICATIONS:  Reviewed.   PHYSICAL EXAMINATION:  The patient is a pleasant female in no acute  distress.  She is afebrile with stable vital signs. O2 saturation is 100% on room  air.  HEENT:  Nasal mucosa is erythematous.  Maxillary sinus tenderness to  percussion.  Posterior pharynx is clear.  TMs are normal.  NECK:  Supple without cervical adenopathy.  No JVD.  Lung sounds are clear.  CARDIAC:  Regular rate.  ABDOMEN:  Soft, nontender.  EXTREMITIES:  Warm without any edema.   IMPRESSION/PLAN:  Acute rhinosinusitis.  The patient is to begin  Augmentin x10 days.  Nasonex DM twice daily.  The patient is to restart  Nasacort AQ 2 puffs twice daily.  May use Phenergan with codeine cough  syrup #8 oz 1 teaspoon every 4-6 hours as needed for cough.  The patient  is to return back with Dr. Delton Coombes as scheduled or sooner if needed.      Rubye Oaks, NP  Electronically Signed      Leslye Peer, MD  Electronically Signed   TP/MedQ  DD: 03/01/2007  DT: 03/01/2007  Job #: [id number]

## 2011-01-06 NOTE — Assessment & Plan Note (Signed)
[facility]                             PULMONARY OFFICE NOTE   NAME:[name], [name]                 MRN:          [id number]  DATE:07/07/2007                            DOB:          02-Nov-1955    SUBJECTIVE:  [name] is a very pleasant 55 year old woman with a  history of stable sarcoidosis, who follows up today.  She has had  significant problems with nasal congestion and chronic sinus disease.  I  sent her to be evaluated by Dr. [name] with regard to this and they  are planning for sinus surgery in the next week.  It is unclear so far  as to whether her sinus issues are related to her sarcoidosis, although  biopsies will be performed when the surgery is done.  Her breathing has  been fairly stable.  Her biggest problems have been nasal congestion and  drainage.  She has also had some cough that has been associated with  light-green sputum.   A separate issue that [name] mentions today is that she was  started on Lipitor for hyperlipidemia by her primary physician at [facility].  She began to experience myalgias and some muscle  weakness on this medication.  She stopped it on her own and the myalgias  have improved.  I have asked her not to restart the medication.   CURRENT MEDICATIONS:  1. Nasal saline washes b.i.d.  2. Excedrin p.r.n.   OBJECTIVE:  GENERAL:  This is a pleasant, thin woman who is in no  distress.  HEENT:  She has significant nasal congestion with some postnasal drip.  NECK:  Supple without lymphadenopathy or stridor.  LUNGS:  Clear bilaterally.  HEART:  Regular with murmur.  ABDOMEN:  Benign.  EXTREMITIES:  No clubbing, cyanosis, or edema.   IMPRESSION:  1. Sarcoidosis that appears to be clinically stable.  2. Chronic sinusitis and postnasal drip with no resolution despite      aggressive medical therapy.  She is planning for sinus surgery by      Dr. [name] on July 13, 2007.  There are no pulmonary      contraindications to proceeding with the surgery.  3. Myalgias, likely side effect of Lipitor, improved   PLAN:  1. Continue nasal hygiene as recommended by Dr. [name].  2. She is cleared from a pulmonary standpoint to proceed with her      sinus surgery.  3. I will follow up with her 6 weeks afterwards to review biopsy      results.  If we reveal sarcoidosis as a contributor to her sinus      obstruction, then it may be necessary to treat her with anti-      inflammatories despite her stable lung exam and findings.  4. I will check CMP and total CPK given her complaints of myagias and      weakness while taking Lipitor.     Leslye Peer, MD  Electronically Signed    RSB/MedQ  DD: 07/07/2007  DT: 07/07/2007  Job #: [id number]   cc:   Onalee Hua L. [name],  M.D. 

## 2011-01-09 NOTE — Assessment & Plan Note (Signed)
[facility]                             PULMONARY OFFICE NOTE   NAME:Sumners-LINDENMaylyn, [name]                 MRN:          [id number]  DATE:08/09/2006                            DOB:          30-Apr-1956    SUBJECTIVE:  [name] is a 55 year old woman with a history of biopsy  documented sarcoidosis who follows up today to review the results of her  pulmonary function testing.  She tells me that her breathing is about  the same as on our initial visit in November.  Since that visit she has  been treated with a course of antibiotics for nasal drainage and cough.  Her cough is improved, as has her nasal drainage, but the cough does  persist and is currently nonproductive.  She continues to have some  exertional dyspnea.   CURRENT MEDICATIONS:  1. Nasal saline washes b.i.d.  2. Nasacort AQ 2 sprays to each nostril daily.   EXAMINATION:  GENERAL:  This is a pleasant woman in no distress, her  weight is 140 pounds, temperature 98.6, blood pressure 130/86, heart  rate 80, SPO2 100% on room air.  LUNGS:  Clear to auscultation bilaterally.  NECK:  Without stridor.  Her exam is otherwise unchanged from her previous visit in November of  2007.   Pulmonary function testing was performed on August 04, 2006.  This  showed an FVC of 2.84 or 82% of predicted, an FEV1 of 2.29 or 86% of  predicted and a ratio of 80%.  There was no bronchodilator  responsiveness.  Her lung volumes were normal.  Her diffusion capacity  was low at 59% of predicted but corrected to the normal range when  adjusted for albuterol volume.   IMPRESSION:  1. Sarcoidosis.  2. Nasal congestion and drainage with a history of chronic sinus      disease.  Her postnasal drip is potentially exacerbating her cough.      Interestingly she has no airflow limitation on her current      spirometry.  Her FEV1 and FVC are almost identical to those done in      2004.   PLAN:  1. CT scan of the  sinuses to rule out chronic sinusitis.  2. I will refer [name] to Dr. [name] with ENT.  3. I will not start prednisone or standing bronchodilators at this      time.  4. I will follow up with [name] in 3 months or sooner should      she have any difficulty in the interim.    Leslye Peer, MD  Electronically Signed   RSB/MedQ  DD: 08/20/2006  DT: 08/20/2006  Job #: [id number]   cc:   Onalee Hua L. [name], M.D.

## 2011-01-09 NOTE — Assessment & Plan Note (Signed)
[facility]                             PULMONARY OFFICE NOTE   NAME:[name], [name]                 MRN:          [id number]  DATE:08/13/2006                            DOB:          05/03/1956    REASON FOR CONSULTATION:  This is a reinitiation of care for [name]  previously seen in clinic for sarcoidosis.   BRIEF HISTORY:  [name] is a 55 year old woman who has been followed  by Dr. Billy Fischer for sarcoidosis diagnosed by lymph nodal biopsy.  She has been fairly stable since 2004 off of systemic therapy.  She has  reinitiated pulmonary care, complaining of an increase in her cough for  about the last 3 months.  Her cough is for the most part nonproductive.  She has had sinus symptoms with headache, fullness and yellowish  drainage.  She was treated in July for acute sinusitis with antibiotics  with some transient improvement in these symptoms.  She now has a  baseline stuffiness and nasal congestion with chronic drainage down the  back of her throat.  Of note, she has had a history of sinus surgery  back in 1995.  She is also complaining of increased shortness of breath,  beyond her baseline, over about the same time.  She has dropped her  exercise frequency compared with prior to this shortness of breath.  She  has not had any wheezing, she has not noticed any new lymphadenopathy,  but she has had transient rash, particularly on her back that comes and  goes.  She denies any significant visual complaints.  She does keep an  annual followup appointment with her ophthalmologist to ensure no ocular  manifestations of her sarcoidosis.   REVIEW OF SYSTEMS:  Is significant for dyspnea, nonproductive cough,  sore throat that she relates to her cough, sinus headaches, nasal  congestion, occasional joint stiffness and lower extremity swelling.   PAST MEDICAL HISTORY:  1. Sarcoidosis diagnosed by nodal biopsy as mentioned above.  2.  Allergic rhinitis.  3. Sinusitis with a history of sinus surgery in 1995.  4. Hysterectomy in 2005.  5. Tubal ligation in 1997.  6. Chronic headaches.   ALLERGIES:  No known drug allergies.   CURRENT MEDICATIONS:  Nasal saline washes b.i.d.   SOCIAL HISTORY:  Patient is divorced, she lives alone, she is a never  smoker.  She works as a Producer, television/film/video.  She denies any  significant occupational exposures.   FAMILY HISTORY:  Is significant for COPD, allergic rhinitis, allergies,  asthma, coronary artery disease and breast cancer.   EXAMINATION:  In general this is a pleasant woman in no distress.  Her weight is 140 pounds, temperature 98.6, blood pressure 128/88, heart  rate 76, SPO2 98% on room air.  HEENT:  She has significant nasal congestion with some mild posterior  pharyngeal erythema.  LUNGS:  Are clear without any wheezing.  She has no crackles.  SKIN:  Has a resolving rash on her upper back that looks to be  consistent with her sarcoidosis.  ABDOMEN:  Is soft, nontender, nondistended.  Positive bowel sounds.  EXTREMITIES:  Have no cyanosis, clubbing or edema.  NEUROLOGICALLY:  She has a grossly nonfocal exam.   IMPRESSION:  Sarcoidosis, now with cough and slight worsening of her  dyspnea on exertion.   PLAN:  1. Continue nasal saline washes.  2. Start Nasacort AQ.  3. We will perform a chest x-ray and pulmonary function testing and to      look for interval change.  If she has decreased function or      worsening lymphadenopathy, then we may need to treat her with      systemic corticosteroids.  4. Given her history of sinusitis and her postnasal drip, I may need      to repeat CT scan of the sinuses and refer her to ENT if usual      therapy does not improve her nasal symptoms.  5. She will keep her ophthalmology appointment as scheduled.  6. I will follow up with [name] in 3 to 4 weeks to review the      results of her PFTs and her chest  x-ray.     Leslye Peer, MD  Electronically Signed    RSB/MedQ  DD: 08/20/2006  DT: 08/20/2006  Job #: [id number]

## 2011-04-07 ENCOUNTER — Ambulatory Visit: Payer: Self-pay | Admitting: Emergency Medicine

## 2011-04-13 ENCOUNTER — Encounter: Payer: Self-pay | Admitting: Emergency Medicine

## 2011-04-14 ENCOUNTER — Ambulatory Visit (INDEPENDENT_AMBULATORY_CARE_PROVIDER_SITE_OTHER): Payer: Self-pay | Admitting: Emergency Medicine

## 2011-04-14 VITALS — BP 120/78 | HR 78 | Temp 98.5°F | Ht 65.5 in | Wt 149.2 lb

## 2011-04-14 DIAGNOSIS — D869 Sarcoidosis, unspecified: Secondary | ICD-10-CM

## 2011-04-14 NOTE — Progress Notes (Signed)
[name] is a very pleasant 55 year old woman with a history of stable sarcoidosis. Last seen 11/08. She is not on maintanance meds for sarcoidosis. Since last visit has had sinus surgery for sarcoid involvement [name]). Post-op course comp by MRSA, treated for eradication. Tricor started in 8/09. Seen at [facility] regarding possible ocular sarcoidosis.   [name] 06/16/10 -- 55 yo woman, sarcoidosis. Has had recurrence of her dry cough, a tickle in throat. Frequent throat clearing. Has allergy symptoms, clear congestion. Does NSW occasionally. Not on an allergy regimen. PFT's done today, FEV1 and FVC down slightly from 2007.   [name] 07/23/10 -- Sarcoidosis, allergies/chronic cough, sinus disease (from sarcoid). Stopped lisinopril last time (2 mo ago). Tessalon Perles called in but she hasn't picked up. Continues to cough, has been doing so for a year!. She started nasal saline washes and fluticasone spray.   [name] 08/21/10 -- returns for sarcoidosis and cough. last time I treated her with prednisone for her progressive symptoms and cavitary changes on CT scan of the chest. She began to feel better on th epred - more energy, a bit less cough. She did have insomnia w the pred. Since 12/24 has had URI symptoms, lost her voice. Some relief from alka-seltzer plus, mucinex. Also on fluticasone spray and NSW two times a day.   [name] 09/16/09 -- sarcoidosis, abnormal CT scan. repeated 12/30. She has had persistent cough since last time, still w hoarse voice, globus sensation. Doing NSW's. Greenish/yellow stuff nasal drainage. Breathing is stable.   [name] 11/03/10 -- sarcoidosis, changes on CT scan. Rx for suspected chronic sinusitis w clinda 4 wks, mucous now clear, still drains. We also treated her with another round prednisone for more inflammation around UL cavity, now off. She is exercising more, cough is better.   [name] 04/14/11 -- sarcoidosis, changes on CT scan, last scan was . She has had a return of her  purulent nasal drainage - went back on residual clinda that she had available. The mucous is starting to clear. She nasal saline washes daily. Takes loratadine and flonase. Her cough is more bothersome - more SOB. Denies rash.    Gen: Pleasant, well-nourished, in no distress,  normal affect  ENT: No lesions,  mouth clear,  oropharynx clear, no postnasal drip  Neck: No JVD, no TMG, no carotid bruits  Lungs: No use of accessory muscles, no dullness to percussion, clear without rales or rhonchi  Cardiovascular: RRR, heart sounds normal, no murmur or gallops, no peripheral edema  Musculoskeletal: No deformities, no cyanosis or clubbing  Neuro: alert, non focal  Skin: Warm, no lesions or rashes   SARCOIDOSIS Will perform CT scan chest and sinuses rov next available - will likely need mtx +/- pred.

## 2011-04-14 NOTE — Assessment & Plan Note (Signed)
Will perform CT scan chest and sinuses rov next available - will likely need mtx +/- pred.

## 2011-04-14 NOTE — Patient Instructions (Signed)
We will perform a CT scan of the chest and of the sinuses.  Finish the clindamycin you are already taking  Continue your nasal washes, loratadine and fluticasone.  Follow up with Dr Delton Coombes next available opening.

## 2011-04-15 ENCOUNTER — Ambulatory Visit (INDEPENDENT_AMBULATORY_CARE_PROVIDER_SITE_OTHER)
Admission: RE | Admit: 2011-04-15 | Discharge: 2011-04-15 | Disposition: A | Discharge status: Home / Self Care | Payer: 59 | Source: Ambulatory Visit | Attending: Emergency Medicine | Admitting: Emergency Medicine

## 2011-04-15 DIAGNOSIS — D869 Sarcoidosis, unspecified: Secondary | ICD-10-CM

## 2011-05-05 ENCOUNTER — Ambulatory Visit: Payer: Self-pay | Admitting: Emergency Medicine

## 2011-05-05 ENCOUNTER — Telehealth: Payer: Self-pay | Admitting: Emergency Medicine

## 2011-05-05 NOTE — Telephone Encounter (Signed)
Pt needs OV with RB to discuss chest CT results. Unable to leave a msg. Mailbox was full. WCB.

## 2011-05-06 NOTE — Telephone Encounter (Signed)
Appt resch for Fri., 05/08/2011 w/ RB.

## 2011-05-08 ENCOUNTER — Ambulatory Visit (INDEPENDENT_AMBULATORY_CARE_PROVIDER_SITE_OTHER): Payer: 59 | Admitting: Emergency Medicine

## 2011-05-08 ENCOUNTER — Encounter: Payer: Self-pay | Admitting: Emergency Medicine

## 2011-05-08 VITALS — BP 122/80 | HR 75 | Temp 98.2°F | Ht 65.5 in | Wt 150.0 lb

## 2011-05-08 DIAGNOSIS — D869 Sarcoidosis, unspecified: Secondary | ICD-10-CM

## 2011-05-08 MED ORDER — PREDNISONE 20 MG PO TABS: ORAL_TABLET | ORAL | Status: DC

## 2011-05-08 NOTE — Assessment & Plan Note (Signed)
Progressive parenchymal disease over the last year - initially responds to steroids but then flares and has changes on CXR. I believe she will need standing therapy. Will start pred and then will need to start MTX, steroid-sparing agent.  - Pred 40 qd - f/u in 2 weeks - if we are going to start MTX: need to check BMP, LFT's, CBC, Hep B and Hep C; no EtOH; must use contraception; start 7.5mg  weekly and work up to 10-15mg  weekly; will need to monitor LFT's (initially every 4 weeks, then every 8-12 weeks)

## 2011-05-08 NOTE — Patient Instructions (Signed)
We will start prednisone 40mg  daily Follow up with Dr Delton Coombes in 2 weeks. We will discuss starting methotrexate at your next visit.

## 2011-05-08 NOTE — Progress Notes (Signed)
[name] is a very pleasant 55 year old woman with a history of stable sarcoidosis. Last seen 11/08. She is not on maintanance meds for sarcoidosis. Since last visit has had sinus surgery for sarcoid involvement [name]). Post-op course comp by MRSA, treated for eradication. Tricor started in 8/09. 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   [name] 06/16/10 -- 55 yo woman, sarcoidosis. Has had recurrence of her dry cough, a tickle in throat. Frequent throat clearing. Has allergy symptoms, clear congestion. Does NSW occasionally. Not on an allergy regimen. PFT's done today, FEV1 and FVC down slightly from 2007.   [name] 07/23/10 -- Sarcoidosis, allergies/chronic cough, sinus disease (from sarcoid). Stopped lisinopril last time (2 mo ago). Tessalon Perles called in but she hasn't picked up. Continues to cough, has been doing so for a year!. She started nasal saline washes and fluticasone spray.   [name] 08/21/10 -- returns for sarcoidosis and cough. last time I treated her with prednisone for her progressive symptoms and cavitary changes on CT scan of the chest. She began to feel better on th epred - more energy, a bit less cough. She did have insomnia w the pred. Since 12/24 has had URI symptoms, lost her voice. Some relief from alka-seltzer plus, mucinex. Also on fluticasone spray and NSW two times a day.   [name] 09/16/09 -- sarcoidosis, abnormal CT scan. repeated 12/30. She has had persistent cough since last time, still w hoarse voice, globus sensation. Doing NSW's. Greenish/yellow stuff nasal drainage. Breathing is stable.   [name] 11/03/10 -- sarcoidosis, changes on CT scan. Rx for suspected chronic sinusitis w clinda 4 wks, mucous now clear, still drains. We also treated her with another round prednisone for more inflammation around UL cavity, now off. She is exercising more, cough is better.   [name] 04/14/11 -- sarcoidosis, changes on CT scan . She has had a return of her purulent nasal  drainage - went back on residual clinda that she had available. The mucous is starting to clear. She nasal saline washes daily. Takes loratadine and flonase. Her cough is more bothersome - more SOB. Denies rash.   [name] 05/08/11 -- sarcoidosis, abnormal CT scan. We repeated scan on 8/22 - shows active RML disease, apical B bullae, no nodules. She continues to feel fatigued, have dry cough. We discussed starting methotrexate given the progressive changes on Ct scan of the chest    Gen: Pleasant, well-nourished, in no distress,  normal affect  ENT: No lesions,  mouth clear,  oropharynx clear, no postnasal drip  Neck: No JVD, no TMG, no carotid bruits  Lungs: No use of accessory muscles, no dullness to percussion, clear without rales or rhonchi  Cardiovascular: RRR, heart sounds normal, no murmur or gallops, no peripheral edema  Musculoskeletal: No deformities, no cyanosis or clubbing  Neuro: alert, non focal  Skin: Warm, no lesions or rashes   CT CHEST WITHOUT CONTRAST 04/15/11  Technique: Multidetector CT imaging of the chest was performed  following the standard protocol without IV contrast.  Comparison: Chest radiograph from 11/03/2010 and chest CT from  08/22/2010  Findings: Prominent pretracheal lymph node measures 1.9 cm in short  axis. The pretracheal lymph node has not significantly changed.  Prominent subcarinal lymph node now measures 2.4 cm in the short  axis and previously 2.0 cm. The right subcarinal tissue has  minimally changed. Minimal change in the fullness and adenopathy  in the hilar regions. There are calcifications in the left hilum.  Stable appearance of  prevascular node measuring 1.3 cm in short  axis on sequence 2, image 22. The left anterior descending  coronary artery is heavily calcified.  No evidence for pericardial or pleural fluid. Again noted is a  prominent left axillary lymph node that measures 3.4 x 1.7 cm and  previously measured 3.3 x 1.6 cm. Again  noted is soft tissue  fullness along the superior aspect of the left kidney and adrenal  gland which is similar from exam on 06/19/2010.  The trachea and mainstem bronchi are patent. The bulla in the left  upper lobe measures 3.4 x 3.6 cm and previously measured 3.3 x 2.6  cm. Again noted is bronchiectasis and fibrosis at the lung bases.  Stable nodular thickening involving the left major fissure on  sequence #3, image 35. Stable 7 mm nodule or thickening along the  left major fissure.  Prominent bulla in the right upper lung measures 3.4 cm and  previously measured 3.3 cm. There is a new bulla or cavitary  lesion in the medial left upper lobe on sequence 3, image 12 that  measures up to 1.2 cm. Cavitary lesion in the lateral right upper  lobe now measures 3.4 cm and previously measured 2.3 cm. There is  decreased airspace disease in the right upper lobe but there  appears to be increased bronchiectasis. The margins around the  right lateral cavitary lesion are more distinct. Bronchiectasis  extends towards this lateral cavitary lesion with surrounding  parenchymal thickening and densities. Stable pleural-based  densities involving the right major fissure and right lower lobe  best seen on sequence #3, image 28. No acute osseous abnormality.  IMPRESSION:  The patchy airspace densities in the right upper lung have  improved. However, there is enlargement of the bulla or cavitary  lesions throughout both lungs and there is a new lesion in the  medial right upper lobe. Increased bronchiectasis in the right  upper lobe. These findings are likely the sequelae of sarcoidosis.  There is no significant nodularity.  Mediastinal and hilar lymphadenopathy. There has been some  enlargement of the subcarinal lymphadenopathy.  Fibrosis at the lung bases.  Coronary artery calcifications.   SARCOIDOSIS Progressive parenchymal disease over the last year - initially responds to steroids but then  flares and has changes on CXR. I believe she will need standing therapy. Will start pred and then will need to start MTX, steroid-sparing agent.  - Pred 40 qd - f/u in 2 weeks - if we are going to start MTX: need to check BMP, LFT's, CBC, Hep B and Hep C; no EtOH; must use contraception; start 7.5mg  weekly and work up to 10-15mg  weekly; will need to monitor LFT's (initially every 4 weeks, then every 8-12 weeks)

## 2011-05-26 ENCOUNTER — Encounter: Payer: Self-pay | Admitting: Emergency Medicine

## 2011-05-26 ENCOUNTER — Ambulatory Visit (INDEPENDENT_AMBULATORY_CARE_PROVIDER_SITE_OTHER): Payer: 59 | Admitting: Emergency Medicine

## 2011-05-26 ENCOUNTER — Other Ambulatory Visit (INDEPENDENT_AMBULATORY_CARE_PROVIDER_SITE_OTHER): Payer: 59

## 2011-05-26 VITALS — BP 110/80 | HR 66 | Temp 98.0°F | Ht 65.5 in | Wt 160.2 lb

## 2011-05-26 DIAGNOSIS — D869 Sarcoidosis, unspecified: Secondary | ICD-10-CM

## 2011-05-26 LAB — CBC WITH DIFFERENTIAL/PLATELET
Basophils Absolute: 0 10*3/uL (ref 0.0–0.1)
Basophils Relative: 0.1 % (ref 0.0–3.0)
Eosinophils Relative: 0.1 % (ref 0.0–5.0)
HCT: 39.4 % (ref 36.0–46.0)
Hemoglobin: 12.9 g/dL (ref 12.0–15.0)
Lymphocytes Relative: 13.6 % (ref 12.0–46.0)
Lymphs Abs: 2.2 10*3/uL (ref 0.7–4.0)
Monocytes Relative: 5 % (ref 3.0–12.0)
Neutro Abs: 13.1 10*3/uL — ABNORMAL HIGH (ref 1.4–7.7)
RBC: 4.67 Mil/uL (ref 3.87–5.11)
WBC: 16.2 10*3/uL — ABNORMAL HIGH (ref 4.5–10.5)

## 2011-05-26 NOTE — Progress Notes (Signed)
[name] is a very pleasant 55 year old woman with a history of stable sarcoidosis. Last seen 11/08. She is not on maintanance meds for sarcoidosis. Since last visit has had sinus surgery for sarcoid involvement [name]). Post-op course comp by MRSA, treated for eradication. Tricor started in 8/09. 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   [name] 06/16/10 -- 55 yo woman, sarcoidosis. Has had recurrence of her dry cough, a tickle in throat. Frequent throat clearing. Has allergy symptoms, clear congestion. Does NSW occasionally. Not on an allergy regimen. PFT's done today, FEV1 and FVC down slightly from 2007.   [name] 07/23/10 -- Sarcoidosis, allergies/chronic cough, sinus disease (from sarcoid). Stopped lisinopril last time (2 mo ago). Tessalon Perles called in but she hasn't picked up. Continues to cough, has been doing so for a year!. She started nasal saline washes and fluticasone spray.   [name] 08/21/10 -- returns for sarcoidosis and cough. last time I treated her with prednisone for her progressive symptoms and cavitary changes on CT scan of the chest. She began to feel better on th epred - more energy, a bit less cough. She did have insomnia w the pred. Since 12/24 has had URI symptoms, lost her voice. Some relief from alka-seltzer plus, mucinex. Also on fluticasone spray and NSW two times a day.   [name] 09/16/09 -- sarcoidosis, abnormal CT scan. repeated 12/30. She has had persistent cough since last time, still w hoarse voice, globus sensation. Doing NSW's. Greenish/yellow stuff nasal drainage. Breathing is stable.   [name] 11/03/10 -- sarcoidosis, changes on CT scan. Rx for suspected chronic sinusitis w clinda 4 wks, mucous now clear, still drains. We also treated her with another round prednisone for more inflammation around UL cavity, now off. She is exercising more, cough is better.   [name] 04/14/11 -- sarcoidosis, changes on CT scan . She has had a return of her purulent nasal  drainage - went back on residual clinda that she had available. The mucous is starting to clear. She nasal saline washes daily. Takes loratadine and flonase. Her cough is more bothersome - more SOB. Denies rash.   [name] 05/08/11 -- sarcoidosis, abnormal CT scan. We repeated scan on 8/22 - shows active RML disease, apical B bullae, no nodules. She continues to feel fatigued, have dry cough. We discussed starting methotrexate given the progressive changes on Ct scan of the chest   [name] 05/26/11 -- sarcoidosis, abnormal CT scan as above. Started Pred 40mg  qd last visit.    Gen: Pleasant, well-nourished, in no distress,  normal affect  ENT: No lesions,  mouth clear,  oropharynx clear, no postnasal drip  Neck: No JVD, no TMG, no carotid bruits  Lungs: No use of accessory muscles, no dullness to percussion, clear without rales or rhonchi  Cardiovascular: RRR, heart sounds normal, no murmur or gallops, no peripheral edema  Musculoskeletal: No deformities, no cyanosis or clubbing  Neuro: alert, non focal  Skin: Warm, no lesions or rashes   CT CHEST WITHOUT CONTRAST 04/15/11  Technique: Multidetector CT imaging of the chest was performed  following the standard protocol without IV contrast.  Comparison: Chest radiograph from 11/03/2010 and chest CT from  08/22/2010  Findings: Prominent pretracheal lymph node measures 1.9 cm in short  axis. The pretracheal lymph node has not significantly changed.  Prominent subcarinal lymph node now measures 2.4 cm in the short  axis and previously 2.0 cm. The right subcarinal tissue has  minimally changed. Minimal change in the fullness and  adenopathy  in the hilar regions. There are calcifications in the left hilum.  Stable appearance of prevascular node measuring 1.3 cm in short  axis on sequence 2, image 22. The left anterior descending  coronary artery is heavily calcified.  No evidence for pericardial or pleural fluid. Again noted is a  prominent left  axillary lymph node that measures 3.4 x 1.7 cm and  previously measured 3.3 x 1.6 cm. Again noted is soft tissue  fullness along the superior aspect of the left kidney and adrenal  gland which is similar from exam on 06/19/2010.  The trachea and mainstem bronchi are patent. The bulla in the left  upper lobe measures 3.4 x 3.6 cm and previously measured 3.3 x 2.6  cm. Again noted is bronchiectasis and fibrosis at the lung bases.  Stable nodular thickening involving the left major fissure on  sequence #3, image 35. Stable 7 mm nodule or thickening along the  left major fissure.  Prominent bulla in the right upper lung measures 3.4 cm and  previously measured 3.3 cm. There is a new bulla or cavitary  lesion in the medial left upper lobe on sequence 3, image 12 that  measures up to 1.2 cm. Cavitary lesion in the lateral right upper  lobe now measures 3.4 cm and previously measured 2.3 cm. There is  decreased airspace disease in the right upper lobe but there  appears to be increased bronchiectasis. The margins around the  right lateral cavitary lesion are more distinct. Bronchiectasis  extends towards this lateral cavitary lesion with surrounding  parenchymal thickening and densities. Stable pleural-based  densities involving the right major fissure and right lower lobe  best seen on sequence #3, image 28. No acute osseous abnormality.  IMPRESSION:  The patchy airspace densities in the right upper lung have  improved. However, there is enlargement of the bulla or cavitary  lesions throughout both lungs and there is a new lesion in the  medial right upper lobe. Increased bronchiectasis in the right  upper lobe. These findings are likely the sequelae of sarcoidosis.  There is no significant nodularity.  Mediastinal and hilar lymphadenopathy. There has been some  enlargement of the subcarinal lymphadenopathy.  Fibrosis at the lung bases.  Coronary artery  calcifications.   SARCOIDOSIS Decrease pred to 20mg  qd  Check LFT, CBC, BMP, hepatitis panels in prep for probable MTX

## 2011-05-26 NOTE — Progress Notes (Signed)
Addended by: Salli Quarry on: 05/26/2011 05:14 PM   Modules accepted: Orders

## 2011-05-26 NOTE — Progress Notes (Deleted)
  Subjective:    Patient ID: [name], female    DOB: May 10, 1956, 55 y.o.   MRN: [id number]  HPI    Review of Systems     Objective:   Physical Exam        Assessment & Plan:

## 2011-05-26 NOTE — Assessment & Plan Note (Signed)
Decrease pred to 20mg  qd  Check LFT, CBC, BMP, hepatitis panels in prep for probable MTX

## 2011-05-26 NOTE — Patient Instructions (Signed)
Please decrease prednisone to 20mg  daily We will check blood work today Follow up with Dr Delton Coombes in 3 - 4 weeks to discuss starting methotrexate.

## 2011-05-27 LAB — HEPATITIS PANEL, ACUTE
HCV Ab: NEGATIVE
Hep A IgM: NEGATIVE

## 2011-05-27 LAB — HEPATIC FUNCTION PANEL
ALT: 24 U/L (ref 0–35)
AST: 16 U/L (ref 0–37)
Albumin: 3.7 g/dL (ref 3.5–5.2)
Alkaline Phosphatase: 87 U/L (ref 39–117)

## 2011-05-27 LAB — BASIC METABOLIC PANEL
CO2: 31 mEq/L (ref 19–32)
Calcium: 8.9 mg/dL (ref 8.4–10.5)
Creatinine, Ser: 1 mg/dL (ref 0.4–1.2)
GFR: 77.18 mL/min (ref >60.00)
Sodium: 139 mEq/L (ref 135–145)

## 2011-06-17 ENCOUNTER — Other Ambulatory Visit: Payer: Self-pay | Admitting: *Deleted

## 2011-06-17 MED ORDER — FLUTICASONE PROPIONATE 50 MCG/ACT NA SUSP: 2.0000 | Freq: Two times a day (BID) | NASAL | Status: DC

## 2011-06-24 ENCOUNTER — Ambulatory Visit (INDEPENDENT_AMBULATORY_CARE_PROVIDER_SITE_OTHER): Payer: 59 | Admitting: Emergency Medicine

## 2011-06-24 ENCOUNTER — Encounter: Payer: Self-pay | Admitting: Emergency Medicine

## 2011-06-24 DIAGNOSIS — R05 Cough: Secondary | ICD-10-CM

## 2011-06-24 DIAGNOSIS — D869 Sarcoidosis, unspecified: Secondary | ICD-10-CM

## 2011-06-24 MED ORDER — LORATADINE 10 MG PO TABS: 10.0000 mg | ORAL_TABLET | Freq: Every day | ORAL | Status: DC

## 2011-06-24 NOTE — Patient Instructions (Signed)
Increase prednisone to 30mg  daily We will revisit possibly starting methotrexate after the holidays.  Follow up with Dr Delton Coombes in January 2013

## 2011-06-24 NOTE — Assessment & Plan Note (Signed)
She likely needs MTX, her LFt and hepatitis panel are normal. She wants to defer to after holidays if possible. In meantime we wil increase pred to 30mg  qd.

## 2011-06-24 NOTE — Assessment & Plan Note (Signed)
Refill claritin  

## 2011-06-24 NOTE — Progress Notes (Signed)
Ms. Renaldo is a very pleasant 55 year old woman with a history of stable sarcoidosis. Last seen 11/08. She is not on maintanance meds for sarcoidosis. Since last visit has had sinus surgery for sarcoid involvement [name]). Post-op course comp by MRSA, treated for eradication. Tricor started in 8/09. 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   [name] 06/16/10 -- 55 yo woman, sarcoidosis. Has had recurrence of her dry cough, a tickle in throat. Frequent throat clearing. Has allergy symptoms, clear congestion. Does NSW occasionally. Not on an allergy regimen. PFT's done today, FEV1 and FVC down slightly from 2007.   [name] 07/23/10 -- Sarcoidosis, allergies/chronic cough, sinus disease (from sarcoid). Stopped lisinopril last time (2 mo ago). Tessalon Perles called in but she hasn't picked up. Continues to cough, has been doing so for a year!. She started nasal saline washes and fluticasone spray.   [name] 08/21/10 -- returns for sarcoidosis and cough. last time I treated her with prednisone for her progressive symptoms and cavitary changes on CT scan of the chest. She began to feel better on th epred - more energy, a bit less cough. She did have insomnia w the pred. Since 12/24 has had URI symptoms, lost her voice. Some relief from alka-seltzer plus, mucinex. Also on fluticasone spray and [place] two times a day.   [name] 09/16/09 -- sarcoidosis, abnormal CT scan. repeated 12/30. She has had persistent cough since last time, still w hoarse voice, globus sensation. Doing NSW's. Greenish/yellow stuff nasal drainage. Breathing is stable.   [name] 11/03/10 -- sarcoidosis, changes on CT scan. Rx for suspected chronic sinusitis w clinda 4 wks, mucous now clear, still drains. We also treated her with another round prednisone for more inflammation around UL cavity, now off. She is exercising more, cough is better.   [name] 04/14/11 -- sarcoidosis, changes on CT scan . She has had a return of her purulent nasal  drainage - went back on residual clinda that she had available. The mucous is starting to clear. She nasal saline washes daily. Takes loratadine and flonase. Her cough is more bothersome - more SOB. Denies rash.   [name] 05/08/11 -- sarcoidosis, abnormal CT scan. We repeated scan on 8/22 - shows active RML disease, apical B bullae, no nodules. She continues to feel fatigued, have dry cough. We discussed starting methotrexate given the progressive changes on Ct scan of the chest   [name] 05/26/11 -- sarcoidosis, abnormal CT scan as above. Started Pred 40mg  qd last visit.   [name] 06/24/11 -- sarcoidosis with cavitary dz on CT scan as below. Last time we decreased pred to 20mg  qd - has had more cough and drainage since that change. We discussed possibly starting MTX last time - LFT and hepatitis panel were normal last time. She wants to wait until after the holidays to start the MTX.     Gen: Pleasant, well-nourished, in no distress,  normal affect  ENT: No lesions,  mouth clear,  oropharynx clear, no postnasal drip  Neck: No JVD, no TMG, no carotid bruits  Lungs: No use of accessory muscles, no dullness to percussion, clear without rales or rhonchi  Cardiovascular: RRR, heart sounds normal, no murmur or gallops, no peripheral edema  Musculoskeletal: No deformities, no cyanosis or clubbing  Neuro: alert, non focal  Skin: Warm, no lesions or rashes   CT CHEST WITHOUT CONTRAST 04/15/11  Technique: Multidetector CT imaging of the chest was performed  following the standard protocol without IV contrast.  Comparison: Chest radiograph  from 11/03/2010 and chest CT from  08/22/2010  Findings: Prominent pretracheal lymph node measures 1.9 cm in short  axis. The pretracheal lymph node has not significantly changed.  Prominent subcarinal lymph node now measures 2.4 cm in the short  axis and previously 2.0 cm. The right subcarinal tissue has  minimally changed. Minimal change in the fullness and adenopathy   in the hilar regions. There are calcifications in the left hilum.  Stable appearance of prevascular node measuring 1.3 cm in short  axis on sequence 2, image 22. The left anterior descending  coronary artery is heavily calcified.  No evidence for pericardial or pleural fluid. Again noted is a  prominent left axillary lymph node that measures 3.4 x 1.7 cm and  previously measured 3.3 x 1.6 cm. Again noted is soft tissue  fullness along the superior aspect of the left kidney and adrenal  gland which is similar from exam on 06/19/2010.  The trachea and mainstem bronchi are patent. The bulla in the left  upper lobe measures 3.4 x 3.6 cm and previously measured 3.3 x 2.6  cm. Again noted is bronchiectasis and fibrosis at the lung bases.  Stable nodular thickening involving the left major fissure on  sequence #3, image 35. Stable 7 mm nodule or thickening along the  left major fissure.  Prominent bulla in the right upper lung measures 3.4 cm and  previously measured 3.3 cm. There is a new bulla or cavitary  lesion in the medial left upper lobe on sequence 3, image 12 that  measures up to 1.2 cm. Cavitary lesion in the lateral right upper  lobe now measures 3.4 cm and previously measured 2.3 cm. There is  decreased airspace disease in the right upper lobe but there  appears to be increased bronchiectasis. The margins around the  right lateral cavitary lesion are more distinct. Bronchiectasis  extends towards this lateral cavitary lesion with surrounding  parenchymal thickening and densities. Stable pleural-based  densities involving the right major fissure and right lower lobe  best seen on sequence #3, image 28. No acute osseous abnormality.  IMPRESSION:  The patchy airspace densities in the right upper lung have  improved. However, there is enlargement of the bulla or cavitary  lesions throughout both lungs and there is a new lesion in the  medial right upper lobe. Increased  bronchiectasis in the right  upper lobe. These findings are likely the sequelae of sarcoidosis.  There is no significant nodularity.  Mediastinal and hilar lymphadenopathy. There has been some  enlargement of the subcarinal lymphadenopathy.  Fibrosis at the lung bases.  Coronary artery calcifications.   SARCOIDOSIS She likely needs MTX, her LFt and hepatitis panel are normal. She wants to defer to after holidays if possible. In meantime we wil increase pred to 30mg  qd.   COUGH Refill claritin

## 2011-09-15 ENCOUNTER — Encounter: Payer: Self-pay | Admitting: Emergency Medicine

## 2011-09-15 ENCOUNTER — Ambulatory Visit (INDEPENDENT_AMBULATORY_CARE_PROVIDER_SITE_OTHER): Payer: 59 | Admitting: Emergency Medicine

## 2011-09-15 VITALS — BP 112/76 | HR 74 | Temp 97.9°F | Ht 65.5 in | Wt 173.2 lb

## 2011-09-15 DIAGNOSIS — J329 Chronic sinusitis, unspecified: Secondary | ICD-10-CM | POA: Insufficient documentation

## 2011-09-15 DIAGNOSIS — J019 Acute sinusitis, unspecified: Secondary | ICD-10-CM

## 2011-09-15 DIAGNOSIS — D869 Sarcoidosis, unspecified: Secondary | ICD-10-CM

## 2011-09-15 MED ORDER — AMOXICILLIN-POT CLAVULANATE 875-125 MG PO TABS: 1.0000 | ORAL_TABLET | Freq: Two times a day (BID) | ORAL | Status: DC

## 2011-09-15 NOTE — Assessment & Plan Note (Addendum)
Need to treat for possible acute sinusitis before committing to the MTX - augmentin bid x 10 days  - loratadine

## 2011-09-15 NOTE — Patient Instructions (Signed)
We will treat sinusitis with Augmentin twice a day for 10 days Decrease your prednisone to 30mg  daily  Follow with Dr Delton Coombes in 2 weeks to discuss starting methotrexate.

## 2011-09-15 NOTE — Progress Notes (Signed)
[name] is a very pleasant 56 year old woman with a history of stable sarcoidosis. Last seen 11/08. She is not on maintanance meds for sarcoidosis. Since last visit has had sinus surgery for sarcoid involvement [name]). Post-op course comp by MRSA, treated for eradication. Tricor started in 8/09. 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   [name] 06/16/10 -- 56 yo woman, sarcoidosis. Has had recurrence of her dry cough, a tickle in throat. Frequent throat clearing. Has allergy symptoms, clear congestion. Does NSW occasionally. Not on an allergy regimen. PFT's done today, FEV1 and FVC down slightly from 2007.   [name] 07/23/10 -- Sarcoidosis, allergies/chronic cough, sinus disease (from sarcoid). Stopped lisinopril last time (2 mo ago). Tessalon Perles called in but she hasn't picked up. Continues to cough, has been doing so for a year!. She started nasal saline washes and fluticasone spray.   [name] 08/21/10 -- returns for sarcoidosis and cough. last time I treated her with prednisone for her progressive symptoms and cavitary changes on CT scan of the chest. She began to feel better on th epred - more energy, a bit less cough. She did have insomnia w the pred. Since 12/24 has had URI symptoms, lost her voice. Some relief from alka-seltzer plus, mucinex. Also on fluticasone spray and [place] two times a day.   [name] 09/16/09 -- sarcoidosis, abnormal CT scan. repeated 12/30. She has had persistent cough since last time, still w hoarse voice, globus sensation. Doing NSW's. Greenish/yellow stuff nasal drainage. Breathing is stable.   [name] 11/03/10 -- sarcoidosis, changes on CT scan. Rx for suspected chronic sinusitis w clinda 4 wks, mucous now clear, still drains. We also treated her with another round prednisone for more inflammation around UL cavity, now off. She is exercising more, cough is better.   [name] 04/14/11 -- sarcoidosis, changes on CT scan . She has had a return of her purulent nasal  drainage - went back on residual clinda that she had available. The mucous is starting to clear. She nasal saline washes daily. Takes loratadine and flonase. Her cough is more bothersome - more SOB. Denies rash.   [name] 05/08/11 -- sarcoidosis, abnormal CT scan. We repeated scan on 8/22 - shows active RML disease, apical B bullae, no nodules. She continues to feel fatigued, have dry cough. We discussed starting methotrexate given the progressive changes on Ct scan of the chest   [name] 05/26/11 -- sarcoidosis, abnormal CT scan as above. Started Pred 40mg  qd last visit.   [name] 06/24/11 -- sarcoidosis with cavitary dz on CT scan as below. Last time we decreased pred to 20mg  qd - has had more cough and drainage since that change. We discussed possibly starting MTX last time - LFT and hepatitis panel were normal last time. She wants to wait until after the holidays to start the MTX.   [name] 09/15/11 -- sarcoidosis with cavitary dz on CT scan. On chronic pred, and have been discussing possible change to MTX. She began to have sinus drainage, green,  and cough about 3 weeks ago. She has has some HA, no fevers. She is on loratadine. Had to use albuterol a few times since last time.     Gen: Pleasant, well-nourished, in no distress,  normal affect  ENT: No lesions,  mouth clear,  oropharynx clear, no postnasal drip, coughing  Neck: No JVD, no TMG, no carotid bruits  Lungs: No use of accessory muscles, no dullness to percussion, clear without rales or rhonchi  Cardiovascular: RRR,  heart sounds normal, no murmur or gallops, no peripheral edema  Musculoskeletal: No deformities, no cyanosis or clubbing  Neuro: alert, non focal  Skin: Warm, no lesions or rashes   CT CHEST WITHOUT CONTRAST 04/15/11  Technique: Multidetector CT imaging of the chest was performed  following the standard protocol without IV contrast.  Comparison: Chest radiograph from 11/03/2010 and chest CT from  08/22/2010  Findings:  Prominent pretracheal lymph node measures 1.9 cm in short  axis. The pretracheal lymph node has not significantly changed.  Prominent subcarinal lymph node now measures 2.4 cm in the short  axis and previously 2.0 cm. The right subcarinal tissue has  minimally changed. Minimal change in the fullness and adenopathy  in the hilar regions. There are calcifications in the left hilum.  Stable appearance of prevascular node measuring 1.3 cm in short  axis on sequence 2, image 22. The left anterior descending  coronary artery is heavily calcified.  No evidence for pericardial or pleural fluid. Again noted is a  prominent left axillary lymph node that measures 3.4 x 1.7 cm and  previously measured 3.3 x 1.6 cm. Again noted is soft tissue  fullness along the superior aspect of the left kidney and adrenal  gland which is similar from exam on 06/19/2010.  The trachea and mainstem bronchi are patent. The bulla in the left  upper lobe measures 3.4 x 3.6 cm and previously measured 3.3 x 2.6  cm. Again noted is bronchiectasis and fibrosis at the lung bases.  Stable nodular thickening involving the left major fissure on  sequence #3, image 35. Stable 7 mm nodule or thickening along the  left major fissure.  Prominent bulla in the right upper lung measures 3.4 cm and  previously measured 3.3 cm. There is a new bulla or cavitary  lesion in the medial left upper lobe on sequence 3, image 12 that  measures up to 1.2 cm. Cavitary lesion in the lateral right upper  lobe now measures 3.4 cm and previously measured 2.3 cm. There is  decreased airspace disease in the right upper lobe but there  appears to be increased bronchiectasis. The margins around the  right lateral cavitary lesion are more distinct. Bronchiectasis  extends towards this lateral cavitary lesion with surrounding  parenchymal thickening and densities. Stable pleural-based  densities involving the right major fissure and right lower lobe    best seen on sequence #3, image 28. No acute osseous abnormality.  IMPRESSION:  The patchy airspace densities in the right upper lung have  improved. However, there is enlargement of the bulla or cavitary  lesions throughout both lungs and there is a new lesion in the  medial right upper lobe. Increased bronchiectasis in the right  upper lobe. These findings are likely the sequelae of sarcoidosis.  There is no significant nodularity.  Mediastinal and hilar lymphadenopathy. There has been some  enlargement of the subcarinal lymphadenopathy.  Fibrosis at the lung bases.  Coronary artery calcifications.   Sinusitis acute Need to treat for possible acute sinusitis before committing to the MTX - augmentin bid x 10 days  - loratadine  SARCOIDOSIS Will start MTX after sinusitis has been rx - decrease pred to 30mg  qd now - next week start MTX 3 pills (7.5mg ) q week + folate 1mg  qd; check LFT and CBC q week x 1 month and then monthly after that. Probably increase to 10-12.5 mg q week as goal.

## 2011-09-15 NOTE — Assessment & Plan Note (Addendum)
Will start MTX after sinusitis has been rx - decrease pred to 30mg  qd now - next week start MTX 3 pills (7.5mg ) q week + folate 1mg  qd; check LFT and CBC q week x 1 month and then monthly after that. Probably increase to 10-12.5 mg q week as goal.

## 2011-09-25 ENCOUNTER — Encounter: Payer: Self-pay | Admitting: Emergency Medicine

## 2011-09-25 ENCOUNTER — Ambulatory Visit (INDEPENDENT_AMBULATORY_CARE_PROVIDER_SITE_OTHER): Payer: 59 | Admitting: Emergency Medicine

## 2011-09-25 VITALS — BP 130/90 | HR 73 | Temp 98.0°F | Ht 65.5 in | Wt 175.0 lb

## 2011-09-25 DIAGNOSIS — D869 Sarcoidosis, unspecified: Secondary | ICD-10-CM

## 2011-09-25 DIAGNOSIS — J019 Acute sinusitis, unspecified: Secondary | ICD-10-CM

## 2011-09-25 DIAGNOSIS — R05 Cough: Secondary | ICD-10-CM

## 2011-09-25 MED ORDER — METHOTREXATE (ANTI-RHEUMATIC) 2.5 MG PO TABS: 7.5000 mg | ORAL_TABLET | ORAL | Status: DC

## 2011-09-25 MED ORDER — FOLIC ACID 1 MG PO TABS: 1.0000 mg | ORAL_TABLET | Freq: Every day | ORAL | Status: AC

## 2011-09-25 NOTE — Patient Instructions (Signed)
Please continue your prednisone at your current dose Start taking methotrexate 7.5 mg once a week (3 tablets). Start taking folic acid 1 mg once a day We will check blood work weekly for the next month and will then space this out Follow with Dr. Delton Coombes in one month

## 2011-09-25 NOTE — Progress Notes (Signed)
[name] is a very pleasant 57 year old woman with a history of stable sarcoidosis. Last seen 11/08. She is not on maintanance meds for sarcoidosis. Since last visit has had sinus surgery for sarcoid involvement [name]). Post-op course comp by MRSA, treated for eradication. Tricor started in 8/09. 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   [name] 06/16/10 -- 56 yo woman, sarcoidosis. Has had recurrence of her dry cough, a tickle in throat. Frequent throat clearing. Has allergy symptoms, clear congestion. Does NSW occasionally. Not on an allergy regimen. PFT's done today, FEV1 and FVC down slightly from 2007.   [name] 07/23/10 -- Sarcoidosis, allergies/chronic cough, sinus disease (from sarcoid). Stopped lisinopril last time (2 mo ago). Tessalon Perles called in but she hasn't picked up. Continues to cough, has been doing so for a year!. She started nasal saline washes and fluticasone spray.   [name] 08/21/10 -- returns for sarcoidosis and cough. last time I treated her with prednisone for her progressive symptoms and cavitary changes on CT scan of the chest. She began to feel better on th epred - more energy, a bit less cough. She did have insomnia w the pred. Since 12/24 has had URI symptoms, lost her voice. Some relief from alka-seltzer plus, mucinex. Also on fluticasone spray and [place] two times a day.   [name] 09/16/09 -- sarcoidosis, abnormal CT scan. repeated 12/30. She has had persistent cough since last time, still w hoarse voice, globus sensation. Doing NSW's. Greenish/yellow stuff nasal drainage. Breathing is stable.   [name] 11/03/10 -- sarcoidosis, changes on CT scan. Rx for suspected chronic sinusitis w clinda 4 wks, mucous now clear, still drains. We also treated her with another round prednisone for more inflammation around UL cavity, now off. She is exercising more, cough is better.   [name] 04/14/11 -- sarcoidosis, changes on CT scan . She has had a return of her purulent nasal  drainage - went back on residual clinda that she had available. The mucous is starting to clear. She nasal saline washes daily. Takes loratadine and flonase. Her cough is more bothersome - more SOB. Denies rash.   [name] 05/08/11 -- sarcoidosis, abnormal CT scan. We repeated scan on 8/22 - shows active RML disease, apical B bullae, no nodules. She continues to feel fatigued, have dry cough. We discussed starting methotrexate given the progressive changes on Ct scan of the chest   [name] 05/26/11 -- sarcoidosis, abnormal CT scan as above. Started Pred 40mg  qd last visit.   [name] 06/24/11 -- sarcoidosis with cavitary dz on CT scan as below. Last time we decreased pred to 20mg  qd - has had more cough and drainage since that change. We discussed possibly starting MTX last time - LFT and hepatitis panel were normal last time. She wants to wait until after the holidays to start the MTX.   [name] 09/15/11 -- sarcoidosis with cavitary dz on CT scan. On chronic pred, and have been discussing possible change to MTX. She began to have sinus drainage, green,  and cough about 3 weeks ago. She has has some HA, no fevers. She is on loratadine. Had to use albuterol a few times since last time.   [name] 09/25/11 -- sarcoidosis with cavitary dz on CT scan. On chronic pred, and have been discussing possible change to MTX. Deferred last OV due to possible acute sinusitis, rx w augmentin. She is on [place], loratadine, nasal steroid. The drainage has cleared, no longer green.     Gen: Pleasant, well-nourished,  in no distress,  normal affect  ENT: No lesions,  mouth clear,  oropharynx clear, no postnasal drip, coughing  Neck: No JVD, no TMG, no carotid bruits  Lungs: No use of accessory muscles, no dullness to percussion, clear without rales or rhonchi  Cardiovascular: RRR, heart sounds normal, no murmur or gallops, no peripheral edema  Musculoskeletal: No deformities, no cyanosis or clubbing  Neuro: alert, non focal  Skin: Warm, no  lesions or rashes   CT CHEST WITHOUT CONTRAST 04/15/11  Technique: Multidetector CT imaging of the chest was performed  following the standard protocol without IV contrast.  Comparison: Chest radiograph from 11/03/2010 and chest CT from  08/22/2010  Findings: Prominent pretracheal lymph node measures 1.9 cm in short  axis. The pretracheal lymph node has not significantly changed.  Prominent subcarinal lymph node now measures 2.4 cm in the short  axis and previously 2.0 cm. The right subcarinal tissue has  minimally changed. Minimal change in the fullness and adenopathy  in the hilar regions. There are calcifications in the left hilum.  Stable appearance of prevascular node measuring 1.3 cm in short  axis on sequence 2, image 22. The left anterior descending  coronary artery is heavily calcified.  No evidence for pericardial or pleural fluid. Again noted is a  prominent left axillary lymph node that measures 3.4 x 1.7 cm and  previously measured 3.3 x 1.6 cm. Again noted is soft tissue  fullness along the superior aspect of the left kidney and adrenal  gland which is similar from exam on 06/19/2010.  The trachea and mainstem bronchi are patent. The bulla in the left  upper lobe measures 3.4 x 3.6 cm and previously measured 3.3 x 2.6  cm. Again noted is bronchiectasis and fibrosis at the lung bases.  Stable nodular thickening involving the left major fissure on  sequence #3, image 35. Stable 7 mm nodule or thickening along the  left major fissure.  Prominent bulla in the right upper lung measures 3.4 cm and  previously measured 3.3 cm. There is a new bulla or cavitary  lesion in the medial left upper lobe on sequence 3, image 12 that  measures up to 1.2 cm. Cavitary lesion in the lateral right upper  lobe now measures 3.4 cm and previously measured 2.3 cm. There is  decreased airspace disease in the right upper lobe but there  appears to be increased bronchiectasis. The margins around  the  right lateral cavitary lesion are more distinct. Bronchiectasis  extends towards this lateral cavitary lesion with surrounding  parenchymal thickening and densities. Stable pleural-based  densities involving the right major fissure and right lower lobe  best seen on sequence #3, image 28. No acute osseous abnormality.  IMPRESSION:  The patchy airspace densities in the right upper lung have  improved. However, there is enlargement of the bulla or cavitary  lesions throughout both lungs and there is a new lesion in the  medial right upper lobe. Increased bronchiectasis in the right  upper lobe. These findings are likely the sequelae of sarcoidosis.  There is no significant nodularity.  Mediastinal and hilar lymphadenopathy. There has been some  enlargement of the subcarinal lymphadenopathy.  Fibrosis at the lung bases.  Coronary artery calcifications.   SARCOIDOSIS We will start MTX and folate today. Start at 7.5mg  qweek for now. CBC and LFT weekly for a month, then [name] to assess. Continue same pred and then consider weaning once MTX is stable.   Sinusitis acute Appears  to be improved.   COUGH Still being bothered by nasal gtt; she does have sarcoid in the nasal sinuses.

## 2011-09-25 NOTE — Assessment & Plan Note (Signed)
We will start MTX and folate today. Start at 7.5mg  qweek for now. CBC and LFT weekly for a month, then [name] to assess. Continue same pred and then consider weaning once MTX is stable.

## 2011-09-25 NOTE — Assessment & Plan Note (Signed)
Appears to be improved.  

## 2011-09-25 NOTE — Assessment & Plan Note (Signed)
Still being bothered by nasal gtt; she does have sarcoid in the nasal sinuses.

## 2011-10-01 ENCOUNTER — Telehealth: Payer: Self-pay | Admitting: Emergency Medicine

## 2011-10-01 ENCOUNTER — Other Ambulatory Visit (INDEPENDENT_AMBULATORY_CARE_PROVIDER_SITE_OTHER): Payer: 59

## 2011-10-01 DIAGNOSIS — D869 Sarcoidosis, unspecified: Secondary | ICD-10-CM

## 2011-10-01 LAB — CBC WITH DIFFERENTIAL/PLATELET
Basophils Relative: 0.1 % (ref 0.0–3.0)
Eosinophils Relative: 0.1 % (ref 0.0–5.0)
Lymphocytes Relative: 12.9 % (ref 12.0–46.0)
MCV: 87.1 fl (ref 78.0–100.0)
Monocytes Relative: 3.5 % (ref 3.0–12.0)
Neutrophils Relative %: 83.4 % — ABNORMAL HIGH (ref 43.0–77.0)
RBC: 4.71 Mil/uL (ref 3.87–5.11)
WBC: 13.2 10*3/uL — ABNORMAL HIGH (ref 4.5–10.5)

## 2011-10-01 LAB — HEPATIC FUNCTION PANEL
ALT: 17 U/L (ref 0–35)
AST: 18 U/L (ref 0–37)
Alkaline Phosphatase: 84 U/L (ref 39–117)
Bilirubin, Direct: 0.1 mg/dL (ref 0.0–0.3)
Total Protein: 6.9 g/dL (ref 6.0–8.3)

## 2011-10-01 NOTE — Telephone Encounter (Signed)
Pt was seen last week 09/25/2011 by RB.  No mention of prescribing Diflucan.  LMOM for pt TCB

## 2011-10-02 NOTE — Telephone Encounter (Signed)
lmomtcb x 2  

## 2011-10-05 ENCOUNTER — Telehealth: Payer: Self-pay | Admitting: Pulmonary Disease

## 2011-10-05 NOTE — Telephone Encounter (Signed)
lmomtcb  

## 2011-10-05 NOTE — Telephone Encounter (Signed)
Contacted patient and informed her of her blood results and recs. Patient states she is bruising on her arm and that she does not usually bruise. She thought it could be from her medication but states she had her blood drawn in the same arm. She just wanted to let you know. Thanks.

## 2011-10-06 NOTE — Telephone Encounter (Signed)
We have attempted to contact pt x 3 with no return phone call from her.  Per protocol, will sign off on message and wait for pt to call us back.

## 2011-10-07 NOTE — Telephone Encounter (Signed)
Please tell the patient I agree that this is likely related to her blood draw, but that she should call if things change. Thanks

## 2011-10-07 NOTE — Telephone Encounter (Signed)
Left a detailed msg on machine to let the patient know that RB thinks this is most likely related to the blood draw and to call if she has any further  questions.

## 2011-10-08 ENCOUNTER — Other Ambulatory Visit (INDEPENDENT_AMBULATORY_CARE_PROVIDER_SITE_OTHER): Payer: 59

## 2011-10-08 DIAGNOSIS — D869 Sarcoidosis, unspecified: Secondary | ICD-10-CM

## 2011-10-08 LAB — HEPATIC FUNCTION PANEL
ALT: 18 U/L (ref 0–35)
AST: 17 U/L (ref 0–37)
Alkaline Phosphatase: 84 U/L (ref 39–117)
Bilirubin, Direct: 0 mg/dL (ref 0.0–0.3)
Total Bilirubin: 0.6 mg/dL (ref 0.3–1.2)

## 2011-10-08 LAB — CBC WITH DIFFERENTIAL/PLATELET
Eosinophils Relative: 1.6 % (ref 0.0–5.0)
HCT: 40.8 % (ref 36.0–46.0)
Lymphocytes Relative: 18.1 % (ref 12.0–46.0)
Lymphs Abs: 2.1 10*3/uL (ref 0.7–4.0)
Monocytes Relative: 5.9 % (ref 3.0–12.0)
Platelets: 227 10*3/uL (ref 150.0–400.0)
WBC: 11.5 10*3/uL — ABNORMAL HIGH (ref 4.5–10.5)

## 2011-10-09 NOTE — Progress Notes (Signed)
Quick Note:  Called, spoke with pt. I informed her LFT's are normal per RB and she should continue with the same meds. She verbalized understanding of these results and recs and voiced no further questions/concerns at this time. ______

## 2011-10-12 ENCOUNTER — Other Ambulatory Visit: Payer: Self-pay | Admitting: Emergency Medicine

## 2011-10-15 ENCOUNTER — Other Ambulatory Visit (INDEPENDENT_AMBULATORY_CARE_PROVIDER_SITE_OTHER): Payer: 59

## 2011-10-15 DIAGNOSIS — D869 Sarcoidosis, unspecified: Secondary | ICD-10-CM

## 2011-10-15 LAB — CBC WITH DIFFERENTIAL/PLATELET
Eosinophils Absolute: 0 10*3/uL (ref 0.0–0.7)
HCT: 41.1 % (ref 36.0–46.0)
Lymphs Abs: 0.8 10*3/uL (ref 0.7–4.0)
MCHC: 32.5 g/dL (ref 30.0–36.0)
MCV: 88.1 fl (ref 78.0–100.0)
Monocytes Absolute: 0.2 10*3/uL (ref 0.1–1.0)
Neutrophils Relative %: 91.8 % — ABNORMAL HIGH (ref 43.0–77.0)
Platelets: 229 10*3/uL (ref 150.0–400.0)
RDW: 14.3 % (ref 11.5–14.6)
WBC: 11.6 10*3/uL — ABNORMAL HIGH (ref 4.5–10.5)

## 2011-10-15 LAB — HEPATIC FUNCTION PANEL
Bilirubin, Direct: 0 mg/dL (ref 0.0–0.3)
Total Bilirubin: 0.3 mg/dL (ref 0.3–1.2)

## 2011-10-22 ENCOUNTER — Other Ambulatory Visit (INDEPENDENT_AMBULATORY_CARE_PROVIDER_SITE_OTHER): Payer: 59

## 2011-10-22 DIAGNOSIS — D869 Sarcoidosis, unspecified: Secondary | ICD-10-CM

## 2011-10-22 LAB — HEPATIC FUNCTION PANEL
ALT: 19 U/L (ref 0–35)
Albumin: 3.8 g/dL (ref 3.5–5.2)
Alkaline Phosphatase: 78 U/L (ref 39–117)
Bilirubin, Direct: 0.1 mg/dL (ref 0.0–0.3)
Total Protein: 6.6 g/dL (ref 6.0–8.3)

## 2011-10-22 LAB — CBC WITH DIFFERENTIAL/PLATELET
Basophils Relative: 0.1 % (ref 0.0–3.0)
Eosinophils Absolute: 0 10*3/uL (ref 0.0–0.7)
Eosinophils Relative: 0 % (ref 0.0–5.0)
Hemoglobin: 13 g/dL (ref 12.0–15.0)
Lymphocytes Relative: 11.5 % — ABNORMAL LOW (ref 12.0–46.0)
MCHC: 32.6 g/dL (ref 30.0–36.0)
MCV: 88.9 fl (ref 78.0–100.0)
Monocytes Absolute: 0.2 10*3/uL (ref 0.1–1.0)
Neutro Abs: 8.3 10*3/uL — ABNORMAL HIGH (ref 1.4–7.7)
Neutrophils Relative %: 86.5 % — ABNORMAL HIGH (ref 43.0–77.0)
RBC: 4.48 Mil/uL (ref 3.87–5.11)
WBC: 9.6 10*3/uL (ref 4.5–10.5)

## 2011-10-29 ENCOUNTER — Other Ambulatory Visit (INDEPENDENT_AMBULATORY_CARE_PROVIDER_SITE_OTHER): Payer: 59

## 2011-10-29 DIAGNOSIS — D869 Sarcoidosis, unspecified: Secondary | ICD-10-CM

## 2011-10-29 LAB — HEPATIC FUNCTION PANEL
ALT: 18 U/L (ref 0–35)
Albumin: 3.9 g/dL (ref 3.5–5.2)
Total Protein: 6.8 g/dL (ref 6.0–8.3)

## 2011-10-29 LAB — CBC WITH DIFFERENTIAL/PLATELET
Hemoglobin: 12.9 g/dL (ref 12.0–15.0)
Lymphocytes Relative: 10.8 % — ABNORMAL LOW (ref 12.0–46.0)
MCHC: 32 g/dL (ref 30.0–36.0)
Monocytes Absolute: 0.3 10*3/uL (ref 0.1–1.0)
Monocytes Relative: 2.8 % — ABNORMAL LOW (ref 3.0–12.0)
Neutrophils Relative %: 86.3 % — ABNORMAL HIGH (ref 43.0–77.0)
RDW: 14.8 % — ABNORMAL HIGH (ref 11.5–14.6)

## 2011-10-30 ENCOUNTER — Encounter: Payer: Self-pay | Admitting: Emergency Medicine

## 2011-10-30 ENCOUNTER — Ambulatory Visit (INDEPENDENT_AMBULATORY_CARE_PROVIDER_SITE_OTHER): Payer: 59 | Admitting: Emergency Medicine

## 2011-10-30 DIAGNOSIS — D869 Sarcoidosis, unspecified: Secondary | ICD-10-CM

## 2011-10-30 DIAGNOSIS — R05 Cough: Secondary | ICD-10-CM

## 2011-10-30 NOTE — Assessment & Plan Note (Addendum)
Appears to be tolerating methotrexate without any significant side effects. CBC and LFTs have remained normal.  - Continue to check lab work weekly probably titrate methotrexate dose - Trace methotrexate to 12.5 mg weekly - Continue folic acid - Decrease prednisone to 20 mg daily,  the goal to titrate off over several months - SABA prn

## 2011-10-30 NOTE — Progress Notes (Signed)
[name] is a very pleasant 56 year old woman with a history of stable sarcoidosis. Last seen 11/08. She is not on maintanance meds for sarcoidosis. Since last visit has had sinus surgery for sarcoid involvement [name]). Post-op course comp by MRSA, treated for eradication. Tricor started in 8/09. 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   [name] 06/16/10 -- 56 yo woman, sarcoidosis. Has had recurrence of her dry cough, a tickle in throat. Frequent throat clearing. Has allergy symptoms, clear congestion. Does NSW occasionally. Not on an allergy regimen. PFT's done today, FEV1 and FVC down slightly from 2007.   [name] 07/23/10 -- Sarcoidosis, allergies/chronic cough, sinus disease (from sarcoid). Stopped lisinopril last time (2 mo ago). Tessalon Perles called in but she hasn't picked up. Continues to cough, has been doing so for a year!. She started nasal saline washes and fluticasone spray.   [name] 08/21/10 -- returns for sarcoidosis and cough. last time I treated her with prednisone for her progressive symptoms and cavitary changes on CT scan of the chest. She began to feel better on th epred - more energy, a bit less cough. She did have insomnia w the pred. Since 12/24 has had URI symptoms, lost her voice. Some relief from alka-seltzer plus, mucinex. Also on fluticasone spray and NSW two times a day.   [name] 09/16/09 -- sarcoidosis, abnormal CT scan. repeated 12/30. She has had persistent cough since last time, still w hoarse voice, globus sensation. Doing NSW's. Greenish/yellow stuff nasal drainage. Breathing is stable.   [name] 11/03/10 -- sarcoidosis, changes on CT scan. Rx for suspected chronic sinusitis w clinda 4 wks, mucous now clear, still drains. We also treated her with another round prednisone for more inflammation around UL cavity, now off. She is exercising more, cough is better.   [name] 04/14/11 -- sarcoidosis, changes on CT scan . She has had a return of her purulent nasal  drainage - went back on residual clinda that she had available. The mucous is starting to clear. She nasal saline washes daily. Takes loratadine and flonase. Her cough is more bothersome - more SOB. Denies rash.   [name] 05/08/11 -- sarcoidosis, abnormal CT scan. We repeated scan on 8/22 - shows active RML disease, apical B bullae, no nodules. She continues to feel fatigued, have dry cough. We discussed starting methotrexate given the progressive changes on Ct scan of the chest   [name] 05/26/11 -- sarcoidosis, abnormal CT scan as above. Started Pred 40mg  qd last visit.   [name] 06/24/11 -- sarcoidosis with cavitary dz on CT scan as below. Last time we decreased pred to 20mg  qd - has had more cough and drainage since that change. We discussed possibly starting MTX last time - LFT and hepatitis panel were normal last time. She wants to wait until after the holidays to start the MTX.   [name] 09/15/11 -- sarcoidosis with cavitary dz on CT scan. On chronic pred, and have been discussing possible change to MTX. She began to have sinus drainage, green,  and cough about 3 weeks ago. She has has some HA, no fevers. She is on loratadine. Had to use albuterol a few times since last time.   [name] 09/25/11 -- sarcoidosis with cavitary dz on CT scan. On chronic pred, and have been discussing possible change to MTX. Deferred last OV due to possible acute sinusitis, rx w augmentin. She is on [place], loratadine, nasal steroid. The drainage has cleared, no longer green.   [name] 10/30/11 -- sarcoidosis with  cavitary dz on CT scan. On chronic pred, last time we started MTX 7.5mg  qweek with folate qd. LFT's and CBC's have been stable/normal. Returns today for f/u. She has had a little nausea, sluggishness. Still has a dry cough, nasal gtt. NSW 2x a day, loratadine. She has occasional GERD sx. She has some chest tightness, doesn't really use SABA.    Gen: Pleasant, well-nourished, in no distress,  normal affect  ENT: No lesions,  mouth clear,   oropharynx clear, no postnasal drip, coughing  Neck: No JVD, no TMG, no carotid bruits  Lungs: No use of accessory muscles, no dullness to percussion, clear without rales or rhonchi  Cardiovascular: RRR, heart sounds normal, no murmur or gallops, no peripheral edema  Musculoskeletal: No deformities, no cyanosis or clubbing  Neuro: alert, non focal  Skin: Warm, no lesions or rashes   CT CHEST WITHOUT CONTRAST 04/15/11  Technique: Multidetector CT imaging of the chest was performed  following the standard protocol without IV contrast.  Comparison: Chest radiograph from 11/03/2010 and chest CT from  08/22/2010  Findings: Prominent pretracheal lymph node measures 1.9 cm in short  axis. The pretracheal lymph node has not significantly changed.  Prominent subcarinal lymph node now measures 2.4 cm in the short  axis and previously 2.0 cm. The right subcarinal tissue has  minimally changed. Minimal change in the fullness and adenopathy  in the hilar regions. There are calcifications in the left hilum.  Stable appearance of prevascular node measuring 1.3 cm in short  axis on sequence 2, image 22. The left anterior descending  coronary artery is heavily calcified.  No evidence for pericardial or pleural fluid. Again noted is a  prominent left axillary lymph node that measures 3.4 x 1.7 cm and  previously measured 3.3 x 1.6 cm. Again noted is soft tissue  fullness along the superior aspect of the left kidney and adrenal  gland which is similar from exam on 06/19/2010.  The trachea and mainstem bronchi are patent. The bulla in the left  upper lobe measures 3.4 x 3.6 cm and previously measured 3.3 x 2.6  cm. Again noted is bronchiectasis and fibrosis at the lung bases.  Stable nodular thickening involving the left major fissure on  sequence #3, image 35. Stable 7 mm nodule or thickening along the  left major fissure.  Prominent bulla in the right upper lung measures 3.4 cm and  previously  measured 3.3 cm. There is a new bulla or cavitary  lesion in the medial left upper lobe on sequence 3, image 12 that  measures up to 1.2 cm. Cavitary lesion in the lateral right upper  lobe now measures 3.4 cm and previously measured 2.3 cm. There is  decreased airspace disease in the right upper lobe but there  appears to be increased bronchiectasis. The margins around the  right lateral cavitary lesion are more distinct. Bronchiectasis  extends towards this lateral cavitary lesion with surrounding  parenchymal thickening and densities. Stable pleural-based  densities involving the right major fissure and right lower lobe  best seen on sequence #3, image 28. No acute osseous abnormality.  IMPRESSION:  The patchy airspace densities in the right upper lung have  improved. However, there is enlargement of the bulla or cavitary  lesions throughout both lungs and there is a new lesion in the  medial right upper lobe. Increased bronchiectasis in the right  upper lobe. These findings are likely the sequelae of sarcoidosis.  There is no significant nodularity.  Mediastinal and hilar lymphadenopathy. There has been some  enlargement of the subcarinal lymphadenopathy.  Fibrosis at the lung bases.  Coronary artery calcifications.   SARCOIDOSIS Appears to be tolerating methotrexate without any significant side effects. CBC and LFTs have remained normal.  - Continue to check lab work weekly probably titrate methotrexate dose - Trace methotrexate to 12.5 mg weekly - Continue folic acid - Decrease prednisone to 20 mg daily,  the goal to titrate off over several months - SABA prn   COUGH - add PPI to her nasal allergy regimen

## 2011-10-30 NOTE — Assessment & Plan Note (Signed)
-   add PPI to her nasal allergy regimen

## 2011-10-30 NOTE — Patient Instructions (Addendum)
Please increase methotrexate to 12.5 mg (5 pills) once a week Continue your folic acid every day Decrease prednisone to 20 mg daily Continue your Claritin, nasal saline, and fluticasone nose spray Start taking Dexilant 60mg  daily to see if this helps your cough Use Ventolin 2 puffs up to every 4 hours if needed for shortness of breath or chest tightness We will continue to check her blood work weekly until next visit Follow with Dr Delton Coombes in 1 month

## 2011-11-05 ENCOUNTER — Other Ambulatory Visit (INDEPENDENT_AMBULATORY_CARE_PROVIDER_SITE_OTHER): Payer: 59

## 2011-11-05 DIAGNOSIS — D869 Sarcoidosis, unspecified: Secondary | ICD-10-CM

## 2011-11-05 LAB — CBC WITH DIFFERENTIAL/PLATELET
Basophils Relative: 0.1 % (ref 0.0–3.0)
HCT: 39 % (ref 36.0–46.0)
Hemoglobin: 12.8 g/dL (ref 12.0–15.0)
Lymphocytes Relative: 10 % — ABNORMAL LOW (ref 12.0–46.0)
Lymphs Abs: 1 10*3/uL (ref 0.7–4.0)
MCHC: 32.7 g/dL (ref 30.0–36.0)
Monocytes Relative: 2.6 % — ABNORMAL LOW (ref 3.0–12.0)
Neutro Abs: 9.1 10*3/uL — ABNORMAL HIGH (ref 1.4–7.7)
RBC: 4.44 Mil/uL (ref 3.87–5.11)
RDW: 14.7 % — ABNORMAL HIGH (ref 11.5–14.6)

## 2011-11-05 LAB — HEPATIC FUNCTION PANEL
ALT: 20 U/L (ref 0–35)
AST: 20 U/L (ref 0–37)
Albumin: 3.9 g/dL (ref 3.5–5.2)
Alkaline Phosphatase: 98 U/L (ref 39–117)
Total Protein: 7.1 g/dL (ref 6.0–8.3)

## 2011-11-12 ENCOUNTER — Other Ambulatory Visit (INDEPENDENT_AMBULATORY_CARE_PROVIDER_SITE_OTHER): Payer: 59

## 2011-11-12 DIAGNOSIS — D869 Sarcoidosis, unspecified: Secondary | ICD-10-CM

## 2011-11-12 LAB — HEPATIC FUNCTION PANEL
ALT: 17 U/L (ref 0–35)
AST: 15 U/L (ref 0–37)
Alkaline Phosphatase: 102 U/L (ref 39–117)
Bilirubin, Direct: 0 mg/dL (ref 0.0–0.3)
Total Bilirubin: 0.3 mg/dL (ref 0.3–1.2)

## 2011-11-12 LAB — CBC WITH DIFFERENTIAL/PLATELET
Basophils Absolute: 0 10*3/uL (ref 0.0–0.1)
Eosinophils Relative: 0.5 % (ref 0.0–5.0)
Lymphocytes Relative: 10.6 % — ABNORMAL LOW (ref 12.0–46.0)
Lymphs Abs: 1.2 10*3/uL (ref 0.7–4.0)
Monocytes Relative: 3.7 % (ref 3.0–12.0)
Neutrophils Relative %: 85 % — ABNORMAL HIGH (ref 43.0–77.0)
Platelets: 262 10*3/uL (ref 150.0–400.0)
RDW: 15.3 % — ABNORMAL HIGH (ref 11.5–14.6)
WBC: 11.6 10*3/uL — ABNORMAL HIGH (ref 4.5–10.5)

## 2011-11-19 ENCOUNTER — Ambulatory Visit (INDEPENDENT_AMBULATORY_CARE_PROVIDER_SITE_OTHER): Payer: 59 | Admitting: Adult Health

## 2011-11-19 ENCOUNTER — Other Ambulatory Visit (INDEPENDENT_AMBULATORY_CARE_PROVIDER_SITE_OTHER): Payer: 59

## 2011-11-19 ENCOUNTER — Encounter: Payer: Self-pay | Admitting: Adult Health

## 2011-11-19 VITALS — BP 116/72 | HR 103 | Temp 99.6°F | Ht 65.5 in | Wt 175.4 lb

## 2011-11-19 DIAGNOSIS — D869 Sarcoidosis, unspecified: Secondary | ICD-10-CM

## 2011-11-19 DIAGNOSIS — J019 Acute sinusitis, unspecified: Secondary | ICD-10-CM

## 2011-11-19 LAB — HEPATIC FUNCTION PANEL
ALT: 18 U/L (ref 0–35)
Total Bilirubin: 0.2 mg/dL — ABNORMAL LOW (ref 0.3–1.2)
Total Protein: 6.7 g/dL (ref 6.0–8.3)

## 2011-11-19 LAB — CBC WITH DIFFERENTIAL/PLATELET
Eosinophils Absolute: 0.1 10*3/uL (ref 0.0–0.7)
Eosinophils Relative: 1.4 % (ref 0.0–5.0)
HCT: 39 % (ref 36.0–46.0)
Lymphs Abs: 1.5 10*3/uL (ref 0.7–4.0)
MCHC: 32.8 g/dL (ref 30.0–36.0)
MCV: 87.6 fl (ref 78.0–100.0)
Monocytes Absolute: 0.8 10*3/uL (ref 0.1–1.0)
Platelets: 198 10*3/uL (ref 150.0–400.0)
WBC: 7.5 10*3/uL (ref 4.5–10.5)

## 2011-11-19 MED ORDER — HYDROCODONE-HOMATROPINE 5-1.5 MG/5ML PO SYRP: 5.0000 mL | ORAL_SOLUTION | Freq: Four times a day (QID) | ORAL | Status: AC | PRN

## 2011-11-19 MED ORDER — AMOXICILLIN-POT CLAVULANATE 875-125 MG PO TABS: 1.0000 | ORAL_TABLET | Freq: Two times a day (BID) | ORAL | Status: AC

## 2011-11-19 NOTE — Patient Instructions (Signed)
Augmentin 875mg  Twice daily  For 10 days -take with food  Mucinex DM Twice daily  As needed  Cough/congestion  Fluids and rest  Hydromet 1-2 tsp every 4-6 hr As needed  Cough- may make you sleepy  Please contact office for sooner follow up if symptoms do not improve or worsen or seek emergency care  follow up Dr. Delton Coombes  As planned and As needed

## 2011-11-19 NOTE — Assessment & Plan Note (Addendum)
Acute sinusitis and bronchitis, complicated by underlying sarcoidosis.  Plan    Augmentin 875mg  Twice daily  For 10 days -take with food  Mucinex DM Twice daily  As needed  Cough/congestion  Fluids and rest  Hydromet 1-2 tsp every 4-6 hr As needed  Cough- may make you sleepy  Please contact office for sooner follow up if symptoms do not improve or worsen or seek emergency care  follow up Dr. Delton Coombes  As planned and As needed

## 2011-11-19 NOTE — Progress Notes (Signed)
[name] is a very pleasant 56 year old woman with a history of stable sarcoidosis. Last seen 11/08. She is not on maintanance meds for sarcoidosis. Since last visit has had sinus surgery for sarcoid involvement [name]). Post-op course comp by MRSA, treated for eradication. Tricor started in 8/09. 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   [name] 06/16/10 -- 56 yo woman, sarcoidosis. Has had recurrence of her dry cough, a tickle in throat. Frequent throat clearing. Has allergy symptoms, clear congestion. Does NSW occasionally. Not on an allergy regimen. PFT's done today, FEV1 and FVC down slightly from 2007.   [name] 07/23/10 -- Sarcoidosis, allergies/chronic cough, sinus disease (from sarcoid). Stopped lisinopril last time (2 mo ago). Tessalon Perles called in but she hasn't picked up. Continues to cough, has been doing so for a year!. She started nasal saline washes and fluticasone spray.   [name] 08/21/10 -- returns for sarcoidosis and cough. last time I treated her with prednisone for her progressive symptoms and cavitary changes on CT scan of the chest. She began to feel better on th epred - more energy, a bit less cough. She did have insomnia w the pred. Since 12/24 has had URI symptoms, lost her voice. Some relief from alka-seltzer plus, mucinex. Also on fluticasone spray and NSW two times a day.   [name] 09/16/09 -- sarcoidosis, abnormal CT scan. repeated 12/30. She has had persistent cough since last time, still w hoarse voice, globus sensation. Doing NSW's. Greenish/yellow stuff nasal drainage. Breathing is stable.   [name] 11/03/10 -- sarcoidosis, changes on CT scan. Rx for suspected chronic sinusitis w clinda 4 wks, mucous now clear, still drains. We also treated her with another round prednisone for more inflammation around UL cavity, now off. She is exercising more, cough is better.   [name] 04/14/11 -- sarcoidosis, changes on CT scan . She has had a return of her purulent nasal  drainage - went back on residual clinda that she had available. The mucous is starting to clear. She nasal saline washes daily. Takes loratadine and flonase. Her cough is more bothersome - more SOB. Denies rash.   [name] 05/08/11 -- sarcoidosis, abnormal CT scan. We repeated scan on 8/22 - shows active RML disease, apical B bullae, no nodules. She continues to feel fatigued, have dry cough. We discussed starting methotrexate given the progressive changes on Ct scan of the chest   [name] 05/26/11 -- sarcoidosis, abnormal CT scan as above. Started Pred 40mg  qd last visit.   [name] 06/24/11 -- sarcoidosis with cavitary dz on CT scan as below. Last time we decreased pred to 20mg  qd - has had more cough and drainage since that change. We discussed possibly starting MTX last time - LFT and hepatitis panel were normal last time. She wants to wait until after the holidays to start the MTX.   [name] 09/15/11 -- sarcoidosis with cavitary dz on CT scan. On chronic pred, and have been discussing possible change to MTX. She began to have sinus drainage, green,  and cough about 3 weeks ago. She has has some HA, no fevers. She is on loratadine. Had to use albuterol a few times since last time.   [name] 09/25/11 -- sarcoidosis with cavitary dz on CT scan. On chronic pred, and have been discussing possible change to MTX. Deferred last OV due to possible acute sinusitis, rx w augmentin. She is on [place], loratadine, nasal steroid. The drainage has cleared, no longer green.   [name] 10/30/11 -- sarcoidosis with  cavitary dz on CT scan. On chronic pred, last time we started MTX 7.5mg  qweek with folate qd. LFT's and CBC's have been stable/normal. Returns today for f/u. She has had a little nausea, sluggishness. Still has a dry cough, nasal gtt. NSW 2x a day, loratadine. She has occasional GERD sx. She has some chest tightness, doesn't really use SABA.  >>  11/19/2011 Acute OV  Complains of pt c/o yellow prod cough and chest tightness and achy chest x  1week worsening. Pt has used OTC Mucinex with no relief. Patient complains of sinus pain, and pressure. Over-the-counter medications are not helping at all. Cough is keeping her up at night. She is currently on methotrexate and Prednisone 20mg  daily for her sarcoidosis. She denies any hemoptysis, nausea, vomiting, diarrhea, edema, or rash.    ROS:  Constitutional:   No  weight loss, night sweats,   +Fevers, chills, fatigue, or  lassitude.  HEENT:   No headaches,  Difficulty swallowing,  Tooth/dental problems, or  Sore throat,                No sneezing, itching, ear ache,  +nasal congestion, post nasal drip,   CV:  No chest pain,  Orthopnea, PND, swelling in lower extremities, anasarca, dizziness, palpitations, syncope.   GI  No heartburn, indigestion, abdominal pain, nausea, vomiting, diarrhea, change in bowel habits, loss of appetite, bloody stools.   Resp:  No coughing up of blood.    No chest wall deformity  Skin: no rash or lesions.  GU: no dysuria, change in color of urine, no urgency or frequency.  No flank pain, no hematuria   MS:  No joint pain or swelling.  No decreased range of motion.  No back pain.  Psych:  No change in mood or affect. No depression or anxiety.  No memory loss.      EXAM :   Gen: Pleasant, well-nourished, in no distress,  normal affect  ENT: No lesions,  mouth clear,  oropharynx clear, no postnasal drip, max sinus tenderness   Neck: No JVD, no TMG, no carotid bruits  Lungs: No use of accessory muscles, no dullness to percussion, coarse BS with no wheezing   Cardiovascular: RRR, heart sounds normal, no murmur or gallops, no peripheral edema  Musculoskeletal: No deformities, no cyanosis or clubbing  Neuro: alert, non focal  Skin: Warm, no lesions or rashes   CT CHEST WITHOUT CONTRAST 04/15/11  Technique: Multidetector CT imaging of the chest was performed  following the standard protocol without IV contrast.  Comparison: Chest radiograph  from 11/03/2010 and chest CT from  08/22/2010  Findings: Prominent pretracheal lymph node measures 1.9 cm in short  axis. The pretracheal lymph node has not significantly changed.  Prominent subcarinal lymph node now measures 2.4 cm in the short  axis and previously 2.0 cm. The right subcarinal tissue has  minimally changed. Minimal change in the fullness and adenopathy  in the hilar regions. There are calcifications in the left hilum.  Stable appearance of prevascular node measuring 1.3 cm in short  axis on sequence 2, image 22. The left anterior descending  coronary artery is heavily calcified.  No evidence for pericardial or pleural fluid. Again noted is a  prominent left axillary lymph node that measures 3.4 x 1.7 cm and  previously measured 3.3 x 1.6 cm. Again noted is soft tissue  fullness along the superior aspect of the left kidney and adrenal  gland which is similar from exam on 06/19/2010.  The trachea and mainstem bronchi are patent. The bulla in the left  upper lobe measures 3.4 x 3.6 cm and previously measured 3.3 x 2.6  cm. Again noted is bronchiectasis and fibrosis at the lung bases.  Stable nodular thickening involving the left major fissure on  sequence #3, image 35. Stable 7 mm nodule or thickening along the  left major fissure.  Prominent bulla in the right upper lung measures 3.4 cm and  previously measured 3.3 cm. There is a new bulla or cavitary  lesion in the medial left upper lobe on sequence 3, image 12 that  measures up to 1.2 cm. Cavitary lesion in the lateral right upper  lobe now measures 3.4 cm and previously measured 2.3 cm. There is  decreased airspace disease in the right upper lobe but there  appears to be increased bronchiectasis. The margins around the  right lateral cavitary lesion are more distinct. Bronchiectasis  extends towards this lateral cavitary lesion with surrounding  parenchymal thickening and densities. Stable pleural-based  densities  involving the right major fissure and right lower lobe  best seen on sequence #3, image 28. No acute osseous abnormality.  IMPRESSION:  The patchy airspace densities in the right upper lung have  improved. However, there is enlargement of the bulla or cavitary  lesions throughout both lungs and there is a new lesion in the  medial right upper lobe. Increased bronchiectasis in the right  upper lobe. These findings are likely the sequelae of sarcoidosis.  There is no significant nodularity.  Mediastinal and hilar lymphadenopathy. There has been some  enlargement of the subcarinal lymphadenopathy.  Fibrosis at the lung bases.  Coronary artery calcifications.   No problem-specific assessment & plan notes found for this encounter.

## 2011-12-03 ENCOUNTER — Other Ambulatory Visit (INDEPENDENT_AMBULATORY_CARE_PROVIDER_SITE_OTHER): Payer: 59

## 2011-12-03 ENCOUNTER — Encounter: Payer: Self-pay | Admitting: Emergency Medicine

## 2011-12-03 ENCOUNTER — Ambulatory Visit (INDEPENDENT_AMBULATORY_CARE_PROVIDER_SITE_OTHER): Payer: 59 | Admitting: Emergency Medicine

## 2011-12-03 VITALS — BP 122/80 | HR 68 | Temp 98.0°F | Ht 65.5 in | Wt 178.4 lb

## 2011-12-03 DIAGNOSIS — D869 Sarcoidosis, unspecified: Secondary | ICD-10-CM

## 2011-12-03 LAB — CBC WITH DIFFERENTIAL/PLATELET
Basophils Relative: 0 % (ref 0.0–3.0)
Eosinophils Relative: 1.3 % (ref 0.0–5.0)
Hemoglobin: 12.3 g/dL (ref 12.0–15.0)
Lymphocytes Relative: 8.5 % — ABNORMAL LOW (ref 12.0–46.0)
MCV: 88.8 fl (ref 78.0–100.0)
Neutrophils Relative %: 86.9 % — ABNORMAL HIGH (ref 43.0–77.0)
RBC: 4.32 Mil/uL (ref 3.87–5.11)
WBC: 12.9 10*3/uL — ABNORMAL HIGH (ref 4.5–10.5)

## 2011-12-03 LAB — HEPATIC FUNCTION PANEL
Bilirubin, Direct: 0.1 mg/dL (ref 0.0–0.3)
Total Protein: 6.6 g/dL (ref 6.0–8.3)

## 2011-12-03 NOTE — Patient Instructions (Signed)
Blood work today and then monthly Please continue your Methotrexate as you are taking it. If you continue to feel side effects over the next 2 months, then we will probably ned to seek an alternative.  Decrease your Prednisone to 10mg  daily Use your nasal washes and loratadine  Follow with Dr Delton Coombes in 2 months

## 2011-12-03 NOTE — Assessment & Plan Note (Signed)
CBC and LFT have been ok but she feels washed out from the MTX. Will continue 7.5mg  weekly for now but may need to consider change to another agent.  - decrease pred to 10 - continue the mtx - space out CBC and LFT to q month - rov in 2 months

## 2011-12-03 NOTE — Progress Notes (Signed)
[name] is a very pleasant 56 year old woman with a history of stable sarcoidosis. Last seen 11/08. She is not on maintanance meds for sarcoidosis. Since last visit has had sinus surgery for sarcoid involvement [name]). Post-op course comp by MRSA, treated for eradication. Tricor started in 8/09. 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   [name] 06/16/10 -- 56 yo woman, sarcoidosis. Has had recurrence of her dry cough, a tickle in throat. Frequent throat clearing. Has allergy symptoms, clear congestion. Does NSW occasionally. Not on an allergy regimen. PFT's done today, FEV1 and FVC down slightly from 2007.   [name] 07/23/10 -- Sarcoidosis, allergies/chronic cough, sinus disease (from sarcoid). Stopped lisinopril last time (2 mo ago). Tessalon Perles called in but she hasn't picked up. Continues to cough, has been doing so for a year!. She started nasal saline washes and fluticasone spray.   [name] 08/21/10 -- returns for sarcoidosis and cough. last time I treated her with prednisone for her progressive symptoms and cavitary changes on CT scan of the chest. She began to feel better on th epred - more energy, a bit less cough. She did have insomnia w the pred. Since 12/24 has had URI symptoms, lost her voice. Some relief from alka-seltzer plus, mucinex. Also on fluticasone spray and [place] two times a day.   [name] 09/16/09 -- sarcoidosis, abnormal CT scan. repeated 12/30. She has had persistent cough since last time, still w hoarse voice, globus sensation. Doing NSW's. Greenish/yellow stuff nasal drainage. Breathing is stable.   [name] 11/03/10 -- sarcoidosis, changes on CT scan. Rx for suspected chronic sinusitis w clinda 4 wks, mucous now clear, still drains. We also treated her with another round prednisone for more inflammation around UL cavity, now off. She is exercising more, cough is better.   [name] 04/14/11 -- sarcoidosis, changes on CT scan . She has had a return of her purulent nasal  drainage - went back on residual clinda that she had available. The mucous is starting to clear. She nasal saline washes daily. Takes loratadine and flonase. Her cough is more bothersome - more SOB. Denies rash.   [name] 05/08/11 -- sarcoidosis, abnormal CT scan. We repeated scan on 8/22 - shows active RML disease, apical B bullae, no nodules. She continues to feel fatigued, have dry cough. We discussed starting methotrexate given the progressive changes on Ct scan of the chest   [name] 05/26/11 -- sarcoidosis, abnormal CT scan as above. Started Pred 40mg  qd last visit.   [name] 06/24/11 -- sarcoidosis with cavitary dz on CT scan as below. Last time we decreased pred to 20mg  qd - has had more cough and drainage since that change. We discussed possibly starting MTX last time - LFT and hepatitis panel were normal last time. She wants to wait until after the holidays to start the MTX.   [name] 09/15/11 -- sarcoidosis with cavitary dz on CT scan. On chronic pred, and have been discussing possible change to MTX. She began to have sinus drainage, green,  and cough about 3 weeks ago. She has has some HA, no fevers. She is on loratadine. Had to use albuterol a few times since last time.   [name] 09/25/11 -- sarcoidosis with cavitary dz on CT scan. On chronic pred, and have been discussing possible change to MTX. Deferred last OV due to possible acute sinusitis, rx w augmentin. She is on [place], loratadine, nasal steroid. The drainage has cleared, no longer green.   [name] 10/30/11 -- sarcoidosis with  cavitary dz on CT scan. On chronic pred, last time we started MTX 7.5mg  qweek with folate qd. LFT's and CBC's have been stable/normal. Returns today for f/u. She has had a little nausea, sluggishness. Still has a dry cough, nasal gtt. NSW 2x a day, loratadine. She has occasional GERD sx. She has some chest tightness, doesn't really use SABA.  >>  Acute OV 11/19/11 Complains of pt c/o yellow prod cough and chest tightness and achy chest x  1week worsening. Pt has used OTC Mucinex with no relief. Patient complains of sinus pain, and pressure. Over-the-counter medications are not helping at all. Cough is keeping her up at night. She is currently on methotrexate and Prednisone 20mg  daily for her sarcoidosis. She denies any hemoptysis, nausea, vomiting, diarrhea, edema, or rash.  [name] 12/03/11 -- sarcoidosis with cavitary dz on CT scan. On chronic pred, on MTX 7.5mg  qweek with folate qd since 09/25/11. Was seen 3/28 as above for productive cough, ? Sinusitis/Bronchitis, treated with with augmentin. Drainage from her head and cough are better. LFT and CBC have been normal on the MTX. She held her MTX last week of March while she was sick, then started back. She feels that the MTX is making her more lethargic, feels "heavy".   EXAM :   Gen: Pleasant, well-nourished, in no distress,  normal affect  ENT: No lesions,  mouth clear,  oropharynx clear, no postnasal drip, max sinus tenderness   Neck: No JVD, no TMG, no carotid bruits  Lungs: No use of accessory muscles, no dullness to percussion, coarse BS with no wheezing   Cardiovascular: RRR, heart sounds normal, no murmur or gallops, no peripheral edema  Musculoskeletal: No deformities, no cyanosis or clubbing  Neuro: alert, non focal  Skin: Warm, no lesions or rashes     SARCOIDOSIS CBC and LFT have been ok but she feels washed out from the MTX. Will continue 7.5mg  weekly for now but may need to consider change to another agent.  - decrease pred to 10 - continue the mtx - space out CBC and LFT to q month - rov in 2 months

## 2011-12-31 ENCOUNTER — Telehealth: Payer: Self-pay | Admitting: Emergency Medicine

## 2011-12-31 MED ORDER — AMOXICILLIN-POT CLAVULANATE 875-125 MG PO TABS: 1.0000 | ORAL_TABLET | Freq: Two times a day (BID) | ORAL | Status: AC

## 2011-12-31 NOTE — Telephone Encounter (Signed)
Please have her start augmentin 875mg  bid x 10 days. thanks

## 2011-12-31 NOTE — Telephone Encounter (Signed)
lmomtcb x1 on cell phone and home phone

## 2011-12-31 NOTE — Telephone Encounter (Signed)
Called, spoke with pt who believes she has a sinus infection again.  C/o nose bleeds, nasal congestion, prod cough with green mucus, sinus pressure, and HA.  Symptoms started last week but have gotten worsen.  Denies fever, increased SOB, wheezing.  Taking regular meds and has added mucinex qd.  I offered OV for today but pt requested "can we just see if he can call something in for me given my history with this?"  She was last seen by RB 12/03/11 and does have a pending OV on 02/04/12.  Will forward msg to RB to address when in office this evening -- pt aware.  Thank you.

## 2011-12-31 NOTE — Telephone Encounter (Signed)
I spoke with pt and is aware of RB recs. She voiced her understanding and had no questions. rx has been sent to the pharmacy and nothing further was needed

## 2011-12-31 NOTE — Telephone Encounter (Signed)
?  allergies

## 2012-01-05 ENCOUNTER — Other Ambulatory Visit: Payer: Self-pay | Admitting: Emergency Medicine

## 2012-01-08 ENCOUNTER — Telehealth: Payer: Self-pay | Admitting: Emergency Medicine

## 2012-01-08 MED ORDER — METHOTREXATE (ANTI-RHEUMATIC) 2.5 MG PO TABS: ORAL_TABLET | ORAL | Status: DC

## 2012-01-08 NOTE — Telephone Encounter (Signed)
RB, please advise if ok to send refill and how many refills. Thanks.

## 2012-01-08 NOTE — Telephone Encounter (Signed)
I have sent new Rx to [facility] on [address]. Pt aware of Rx sent.

## 2012-01-08 NOTE — Telephone Encounter (Signed)
Yes - she needs new script to reflect the 5 pills q week dosing, thanks

## 2012-02-04 ENCOUNTER — Encounter: Payer: Self-pay | Admitting: Emergency Medicine

## 2012-02-04 ENCOUNTER — Other Ambulatory Visit (INDEPENDENT_AMBULATORY_CARE_PROVIDER_SITE_OTHER): Payer: 59

## 2012-02-04 ENCOUNTER — Other Ambulatory Visit: Payer: Self-pay | Admitting: Emergency Medicine

## 2012-02-04 ENCOUNTER — Ambulatory Visit (INDEPENDENT_AMBULATORY_CARE_PROVIDER_SITE_OTHER): Payer: 59 | Admitting: Emergency Medicine

## 2012-02-04 VITALS — BP 122/90 | HR 64 | Temp 98.2°F | Ht 65.0 in | Wt 174.6 lb

## 2012-02-04 DIAGNOSIS — D869 Sarcoidosis, unspecified: Secondary | ICD-10-CM

## 2012-02-04 MED ORDER — AZELASTINE HCL 0.1 % NA SOLN: 2.0000 | Freq: Two times a day (BID) | NASAL | Status: DC

## 2012-02-04 NOTE — Assessment & Plan Note (Signed)
Continue the MTX as ordered and get blood work today Stop prednisone Will plan timing for repeat CT scan next visit

## 2012-02-04 NOTE — Assessment & Plan Note (Signed)
Continue loratadine Start astelin nasal spray

## 2012-02-04 NOTE — Patient Instructions (Addendum)
Lab work today Stop prednisone Continue your methotrexate as you are taking  Continue your loratadine daily Add astelin nasal spray, 2 sprays each nostril twice a day Follow with Dr Delton Coombes in 2 months

## 2012-02-04 NOTE — Progress Notes (Signed)
[name] is a very pleasant 56 year old woman with a history of stable sarcoidosis. Last seen 11/08. She is not on maintanance meds for sarcoidosis. Since last visit has had sinus surgery for sarcoid involvement [name]). Post-op course comp by MRSA, treated for eradication. Tricor started in 8/09. 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   [name] 06/16/10 -- 56 yo woman, sarcoidosis. Has had recurrence of her dry cough, a tickle in throat. Frequent throat clearing. Has allergy symptoms, clear congestion. Does NSW occasionally. Not on an allergy regimen. PFT's done today, FEV1 and FVC down slightly from 2007.   [name] 07/23/10 -- Sarcoidosis, allergies/chronic cough, sinus disease (from sarcoid). Stopped lisinopril last time (2 mo ago). Tessalon Perles called in but she hasn't picked up. Continues to cough, has been doing so for a year!. She started nasal saline washes and fluticasone spray.   [name] 08/21/10 -- returns for sarcoidosis and cough. last time I treated her with prednisone for her progressive symptoms and cavitary changes on CT scan of the chest. She began to feel better on th epred - more energy, a bit less cough. She did have insomnia w the pred. Since 12/24 has had URI symptoms, lost her voice. Some relief from alka-seltzer plus, mucinex. Also on fluticasone spray and NSW two times a day.   [name] 09/16/09 -- sarcoidosis, abnormal CT scan. repeated 12/30. She has had persistent cough since last time, still w hoarse voice, globus sensation. Doing NSW's. Greenish/yellow stuff nasal drainage. Breathing is stable.   [name] 11/03/10 -- sarcoidosis, changes on CT scan. Rx for suspected chronic sinusitis w clinda 4 wks, mucous now clear, still drains. We also treated her with another round prednisone for more inflammation around UL cavity, now off. She is exercising more, cough is better.   [name] 04/14/11 -- sarcoidosis, changes on CT scan . She has had a return of her purulent nasal  drainage - went back on residual clinda that she had available. The mucous is starting to clear. She nasal saline washes daily. Takes loratadine and flonase. Her cough is more bothersome - more SOB. Denies rash.   [name] 05/08/11 -- sarcoidosis, abnormal CT scan. We repeated scan on 8/22 - shows active RML disease, apical B bullae, no nodules. She continues to feel fatigued, have dry cough. We discussed starting methotrexate given the progressive changes on Ct scan of the chest   [name] 05/26/11 -- sarcoidosis, abnormal CT scan as above. Started Pred 40mg  qd last visit.   [name] 06/24/11 -- sarcoidosis with cavitary dz on CT scan as below. Last time we decreased pred to 20mg  qd - has had more cough and drainage since that change. We discussed possibly starting MTX last time - LFT and hepatitis panel were normal last time. She wants to wait until after the holidays to start the MTX.   [name] 09/15/11 -- sarcoidosis with cavitary dz on CT scan. On chronic pred, and have been discussing possible change to MTX. She began to have sinus drainage, green,  and cough about 3 weeks ago. She has has some HA, no fevers. She is on loratadine. Had to use albuterol a few times since last time.   [name] 09/25/11 -- sarcoidosis with cavitary dz on CT scan. On chronic pred, and have been discussing possible change to MTX. Deferred last OV due to possible acute sinusitis, rx w augmentin. She is on [place], loratadine, nasal steroid. The drainage has cleared, no longer green.   [name] 10/30/11 -- sarcoidosis with  cavitary dz on CT scan. On chronic pred, last time we started MTX 7.5mg  qweek with folate qd. LFT's and CBC's have been stable/normal. Returns today for f/u. She has had a little nausea, sluggishness. Still has a dry cough, nasal gtt. NSW 2x a day, loratadine. She has occasional GERD sx. She has some chest tightness, doesn't really use SABA.  >>  Acute OV 11/19/11 Complains of pt c/o yellow prod cough and chest tightness and achy chest x  1week worsening. Pt has used OTC Mucinex with no relief. Patient complains of sinus pain, and pressure. Over-the-counter medications are not helping at all. Cough is keeping her up at night. She is currently on methotrexate and Prednisone 20mg  daily for her sarcoidosis. She denies any hemoptysis, nausea, vomiting, diarrhea, edema, or rash.  [name] 12/03/11 -- sarcoidosis with cavitary dz on CT scan. On chronic pred, on MTX 7.5mg  qweek with folate qd since 09/25/11. Was seen 3/28 as above for productive cough, ? Sinusitis/Bronchitis, treated with with augmentin. Drainage from her head and cough are better. LFT and CBC have been normal on the MTX. She held her MTX last week of March while she was sick, then started back. She feels that the MTX is making her more lethargic, feels "heavy".   [name] 02/04/12 -- sarcoidosis with cavitary dz on CT scan. On chronic pred, on MTX 7.5mg  qweek with folate qd since 09/25/11. Since last time treated her for sinusitis w abx, now better. Last time we decreased the pred to 10mg .   EXAM :  Filed Vitals:   02/04/12 1649  BP: 122/90  Pulse: 64  Temp: 98.2 F (36.8 C)   Gen: Pleasant, well-nourished, in no distress,  normal affect  ENT: No lesions,  mouth clear,  oropharynx clear, no postnasal drip, max sinus tenderness   Neck: No JVD, no TMG, no carotid bruits  Lungs: No use of accessory muscles, no dullness to percussion, coarse BS with no wheezing   Cardiovascular: RRR, heart sounds normal, no murmur or gallops, no peripheral edema  Musculoskeletal: No deformities, no cyanosis or clubbing  Neuro: alert, non focal  Skin: Warm, no lesions or rashes   COUGH Continue loratadine Start astelin nasal spray  SARCOIDOSIS Continue the MTX as ordered and get blood work today Stop prednisone Will plan timing for repeat CT scan next visit

## 2012-02-04 NOTE — Addendum Note (Signed)
Addended by: Leslye Peer on: 02/04/2012 05:36 PM   Modules accepted: Orders

## 2012-02-05 LAB — CBC WITH DIFFERENTIAL/PLATELET
Basophils Relative: 0.7 % (ref 0.0–3.0)
Eosinophils Absolute: 0.2 10*3/uL (ref 0.0–0.7)
HCT: 40.2 % (ref 36.0–46.0)
Hemoglobin: 12.9 g/dL (ref 12.0–15.0)
Lymphocytes Relative: 19.3 % (ref 12.0–46.0)
MCHC: 32.1 g/dL (ref 30.0–36.0)
Monocytes Relative: 7.3 % (ref 3.0–12.0)
Neutro Abs: 6.4 10*3/uL (ref 1.4–7.7)
RBC: 4.46 Mil/uL (ref 3.87–5.11)

## 2012-02-05 LAB — HEPATIC FUNCTION PANEL
AST: 21 U/L (ref 0–37)
Albumin: 4.1 g/dL (ref 3.5–5.2)
Alkaline Phosphatase: 107 U/L (ref 39–117)
Total Protein: 7.3 g/dL (ref 6.0–8.3)

## 2012-02-05 NOTE — Progress Notes (Signed)
Quick Note:  Called, spoke with pt. I informed her CBC and LFT are normal, and she should cont with same meds per RB. She verbalized understanding of this and voiced no further questions/concerns at this time. ______

## 2012-02-09 ENCOUNTER — Other Ambulatory Visit: Payer: Self-pay | Admitting: *Deleted

## 2012-02-09 MED ORDER — AZELASTINE HCL 0.1 % NA SOLN: 2.0000 | Freq: Two times a day (BID) | NASAL | Status: DC

## 2012-02-09 NOTE — Telephone Encounter (Signed)
Received e-prescribing error on astelin nasal spray sent on 02/04/12.  [employer], was advised they did not receive this rx.  Therefore, I gave VO to pharmacist who verbalized understanding of this.

## 2012-04-05 ENCOUNTER — Ambulatory Visit: Payer: 59 | Admitting: Emergency Medicine

## 2012-04-14 ENCOUNTER — Encounter: Payer: Self-pay | Admitting: Emergency Medicine

## 2012-04-14 ENCOUNTER — Other Ambulatory Visit (INDEPENDENT_AMBULATORY_CARE_PROVIDER_SITE_OTHER): Payer: 59

## 2012-04-14 ENCOUNTER — Ambulatory Visit (INDEPENDENT_AMBULATORY_CARE_PROVIDER_SITE_OTHER): Payer: 59 | Admitting: Emergency Medicine

## 2012-04-14 VITALS — BP 128/80 | HR 81 | Temp 97.9°F | Ht 65.0 in | Wt 160.2 lb

## 2012-04-14 DIAGNOSIS — D869 Sarcoidosis, unspecified: Secondary | ICD-10-CM

## 2012-04-14 DIAGNOSIS — J019 Acute sinusitis, unspecified: Secondary | ICD-10-CM

## 2012-04-14 LAB — CBC WITH DIFFERENTIAL/PLATELET
Basophils Absolute: 0 10*3/uL (ref 0.0–0.1)
Basophils Relative: 0.8 % (ref 0.0–3.0)
Eosinophils Absolute: 0.4 10*3/uL (ref 0.0–0.7)
Eosinophils Relative: 6 % — ABNORMAL HIGH (ref 0.0–5.0)
HCT: 39.4 % (ref 36.0–46.0)
Hemoglobin: 12.4 g/dL (ref 12.0–15.0)
Lymphocytes Relative: 29.4 % (ref 12.0–46.0)
Lymphs Abs: 1.8 10*3/uL (ref 0.7–4.0)
MCHC: 31.5 g/dL (ref 30.0–36.0)
MCV: 86.8 fl (ref 78.0–100.0)
Monocytes Absolute: 0.8 10*3/uL (ref 0.1–1.0)
Monocytes Relative: 13.7 % — ABNORMAL HIGH (ref 3.0–12.0)
Neutro Abs: 3.1 10*3/uL (ref 1.4–7.7)
Neutrophils Relative %: 50.1 % (ref 43.0–77.0)
Platelets: 221 10*3/uL (ref 150.0–400.0)
RBC: 4.53 Mil/uL (ref 3.87–5.11)
RDW: 13.3 % (ref 11.5–14.6)
WBC: 6.2 10*3/uL (ref 4.5–10.5)

## 2012-04-14 LAB — HEPATIC FUNCTION PANEL
ALT: 18 U/L (ref 0–35)
AST: 25 U/L (ref 0–37)
Albumin: 3.9 g/dL (ref 3.5–5.2)
Alkaline Phosphatase: 107 U/L (ref 39–117)
Bilirubin, Direct: 0.1 mg/dL (ref 0.0–0.3)
Total Bilirubin: 0.6 mg/dL (ref 0.3–1.2)
Total Protein: 7.1 g/dL (ref 6.0–8.3)

## 2012-04-14 MED ORDER — AMOXICILLIN-POT CLAVULANATE 875-125 MG PO TABS: 1.0000 | ORAL_TABLET | Freq: Two times a day (BID) | ORAL | Status: AC

## 2012-04-14 NOTE — Progress Notes (Signed)
[name] is a very pleasant 56 year old woman with a history of stable sarcoidosis. Last seen 11/08. She is not on maintanance meds for sarcoidosis. Since last visit has had sinus surgery for sarcoid involvement [name]). Post-op course comp by MRSA, treated for eradication. Tricor started in 8/09. 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   [name] 06/16/10 -- 56 yo woman, sarcoidosis. Has had recurrence of her dry cough, a tickle in throat. Frequent throat clearing. Has allergy symptoms, clear congestion. Does NSW occasionally. Not on an allergy regimen. PFT's done today, FEV1 and FVC down slightly from 2007.   [name] 07/23/10 -- Sarcoidosis, allergies/chronic cough, sinus disease (from sarcoid). Stopped lisinopril last time (2 mo ago). Tessalon Perles called in but she hasn't picked up. Continues to cough, has been doing so for a year!. She started nasal saline washes and fluticasone spray.   [name] 08/21/10 -- returns for sarcoidosis and cough. last time I treated her with prednisone for her progressive symptoms and cavitary changes on CT scan of the chest. She began to feel better on th epred - more energy, a bit less cough. She did have insomnia w the pred. Since 12/24 has had URI symptoms, lost her voice. Some relief from alka-seltzer plus, mucinex. Also on fluticasone spray and NSW two times a day.   [name] 09/16/09 -- sarcoidosis, abnormal CT scan. repeated 12/30. She has had persistent cough since last time, still w hoarse voice, globus sensation. Doing NSW's. Greenish/yellow stuff nasal drainage. Breathing is stable.   [name] 11/03/10 -- sarcoidosis, changes on CT scan. Rx for suspected chronic sinusitis w clinda 4 wks, mucous now clear, still drains. We also treated her with another round prednisone for more inflammation around UL cavity, now off. She is exercising more, cough is better.   [name] 04/14/11 -- sarcoidosis, changes on CT scan . She has had a return of her purulent nasal  drainage - went back on residual clinda that she had available. The mucous is starting to clear. She nasal saline washes daily. Takes loratadine and flonase. Her cough is more bothersome - more SOB. Denies rash.   [name] 05/08/11 -- sarcoidosis, abnormal CT scan. We repeated scan on 8/22 - shows active RML disease, apical B bullae, no nodules. She continues to feel fatigued, have dry cough. We discussed starting methotrexate given the progressive changes on Ct scan of the chest   [name] 05/26/11 -- sarcoidosis, abnormal CT scan as above. Started Pred 40mg  qd last visit.   [name] 06/24/11 -- sarcoidosis with cavitary dz on CT scan as below. Last time we decreased pred to 20mg  qd - has had more cough and drainage since that change. We discussed possibly starting MTX last time - LFT and hepatitis panel were normal last time. She wants to wait until after the holidays to start the MTX.   [name] 09/15/11 -- sarcoidosis with cavitary dz on CT scan. On chronic pred, and have been discussing possible change to MTX. She began to have sinus drainage, green,  and cough about 3 weeks ago. She has has some HA, no fevers. She is on loratadine. Had to use albuterol a few times since last time.   [name] 09/25/11 -- sarcoidosis with cavitary dz on CT scan. On chronic pred, and have been discussing possible change to MTX. Deferred last OV due to possible acute sinusitis, rx w augmentin. She is on [place], loratadine, nasal steroid. The drainage has cleared, no longer green.   [name] 10/30/11 -- sarcoidosis with  cavitary dz on CT scan. On chronic pred, last time we started MTX 7.5mg  qweek with folate qd. LFT's and CBC's have been stable/normal. Returns today for f/u. She has had a little nausea, sluggishness. Still has a dry cough, nasal gtt. NSW 2x a day, loratadine. She has occasional GERD sx. She has some chest tightness, doesn't really use SABA.  >>  Acute OV 11/19/11 Complains of pt c/o yellow prod cough and chest tightness and achy chest x  1week worsening. Pt has used OTC Mucinex with no relief. Patient complains of sinus pain, and pressure. Over-the-counter medications are not helping at all. Cough is keeping her up at night. She is currently on methotrexate and Prednisone 20mg  daily for her sarcoidosis. She denies any hemoptysis, nausea, vomiting, diarrhea, edema, or rash.  [name] 12/03/11 -- sarcoidosis with cavitary dz on CT scan. On chronic pred, on MTX 7.5mg  qweek with folate qd since 09/25/11. Was seen 3/28 as above for productive cough, ? Sinusitis/Bronchitis, treated with with augmentin. Drainage from her head and cough are better. LFT and CBC have been normal on the MTX. She held her MTX last week of March while she was sick, then started back. She feels that the MTX is making her more lethargic, feels "heavy".   [name] 02/04/12 -- sarcoidosis with cavitary dz on CT scan. On chronic pred, on MTX qweek with folate qd since 09/25/11. Since last time treated her for sinusitis w abx, now better. Last time we decreased the pred to 10mg .   [name] 04/14/12 -- sarcoidosis with cavitary dz on CT scan. On chronic pred, on MTX 12.5 mg qweek with folate qd since 09/25/11. She has been having more sinus drainage, green phlegm, no blood, + HA. She is having associated cough.   EXAM :  Filed Vitals:   04/14/12 1645  BP: 128/80  Pulse: 81  Temp: 97.9 F (36.6 C)   Gen: Pleasant, well-nourished, in no distress,  normal affect  ENT: No lesions,  mouth clear,  oropharynx clear, no postnasal drip, max sinus tenderness   Neck: No JVD, no TMG, no carotid bruits  Lungs: No use of accessory muscles, no dullness to percussion, coarse BS with no wheezing   Cardiovascular: RRR, heart sounds normal, no murmur or gallops, no peripheral edema  Musculoskeletal: No deformities, no cyanosis or clubbing  Neuro: alert, non focal  Skin: Warm, no lesions or rashes   Sinusitis acute This is a recurrent problem, ? Whether it reflects sinus sarcoid dz. Treat w  Augmentin x 10 days  SARCOIDOSIS Suspect this is acute sinusitis, but the sx have started since we stopped her pred and are using MTX alone. Need to consider possibility that this reflects worsening control of her sarcoidosis.  - continue mtx as we treat the sinusitis. If sx persist after augmentin then we may need to do further eval of sinuses +/- chest.  - rov 1 month - will discuss timing next CT chest depending on her clinical course

## 2012-04-14 NOTE — Patient Instructions (Addendum)
Please continue your methotrexate as you are taking it Start Augmentin for 10 days - take to completion.  Get your labwork today Follow with Dr Delton Coombes in 1 month

## 2012-04-14 NOTE — Assessment & Plan Note (Signed)
Suspect this is acute sinusitis, but the sx have started since we stopped her pred and are using MTX alone. Need to consider possibility that this reflects worsening control of her sarcoidosis.  - continue mtx as we treat the sinusitis. If sx persist after augmentin then we may need to do further eval of sinuses +/- chest.  - rov 1 month - will discuss timing next CT chest depending on her clinical course

## 2012-04-14 NOTE — Assessment & Plan Note (Addendum)
This is a recurrent problem, ? Whether it reflects sinus sarcoid dz. Treat w Augmentin x 10 days

## 2012-04-18 NOTE — Progress Notes (Signed)
Quick Note:  Called and spoke with patient regarding results/recs as listed below per RB. Patient verbalized understanding and nothing further needed at this time. ______

## 2012-05-20 ENCOUNTER — Other Ambulatory Visit (INDEPENDENT_AMBULATORY_CARE_PROVIDER_SITE_OTHER): Payer: 59

## 2012-05-20 DIAGNOSIS — D869 Sarcoidosis, unspecified: Secondary | ICD-10-CM

## 2012-05-20 LAB — CBC WITH DIFFERENTIAL/PLATELET
Basophils Relative: 1 % (ref 0.0–3.0)
Eosinophils Absolute: 0.4 10*3/uL (ref 0.0–0.7)
HCT: 37.6 % (ref 36.0–46.0)
Hemoglobin: 11.9 g/dL — ABNORMAL LOW (ref 12.0–15.0)
Lymphs Abs: 1.6 10*3/uL (ref 0.7–4.0)
MCHC: 31.8 g/dL (ref 30.0–36.0)
MCV: 85.7 fl (ref 78.0–100.0)
Monocytes Absolute: 0.7 10*3/uL (ref 0.1–1.0)
Neutro Abs: 3 10*3/uL (ref 1.4–7.7)
Neutrophils Relative %: 52 % (ref 43.0–77.0)
RBC: 4.39 Mil/uL (ref 3.87–5.11)

## 2012-05-20 LAB — HEPATIC FUNCTION PANEL
Albumin: 3.8 g/dL (ref 3.5–5.2)
Total Protein: 6.9 g/dL (ref 6.0–8.3)

## 2012-05-23 ENCOUNTER — Encounter: Payer: Self-pay | Admitting: Emergency Medicine

## 2012-05-23 ENCOUNTER — Ambulatory Visit (INDEPENDENT_AMBULATORY_CARE_PROVIDER_SITE_OTHER): Payer: 59 | Admitting: Emergency Medicine

## 2012-05-23 VITALS — BP 122/80 | HR 81 | Temp 98.0°F | Ht 65.0 in | Wt 160.0 lb

## 2012-05-23 DIAGNOSIS — J329 Chronic sinusitis, unspecified: Secondary | ICD-10-CM

## 2012-05-23 DIAGNOSIS — D869 Sarcoidosis, unspecified: Secondary | ICD-10-CM

## 2012-05-23 MED ORDER — AMOXICILLIN-POT CLAVULANATE 875-125 MG PO TABS: 1.0000 | ORAL_TABLET | Freq: Two times a day (BID) | ORAL | Status: AC

## 2012-05-23 NOTE — Patient Instructions (Addendum)
Start Augmentin twice a day for 6 weeks Follow with Dr Delton Coombes in 6 weeks or sooner if you have any problems Depending on how you are doing, we will discuss restarting prednisone. We may also need to consider referral back to Dr [name] or CT scan of your sinuses

## 2012-05-23 NOTE — Assessment & Plan Note (Signed)
Suspect this is the dx, but concerned that she may be flaring her sinus sarcoid - if so she will need pred again, probably eval by Dr [name] - treat with augmentin 6 weeks. Depending on response then make plans for CT scan or ENT visit

## 2012-05-23 NOTE — Assessment & Plan Note (Signed)
Pulm sx stable, but ? Whether sh eis having sinus manifestations.  - continue mtx for now but may ned to add back pred - folate - LFT/CBC normal - rov 6 weeks

## 2012-05-23 NOTE — Progress Notes (Signed)
[name] is a very pleasant 56 year old woman with a history of stable sarcoidosis. Last seen 11/08. She is not on maintanance meds for sarcoidosis. Since last visit has had sinus surgery for sarcoid involvement [name]). Post-op course comp by MRSA, treated for eradication. Tricor started in 8/09. 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   [name] 06/16/10 -- 56 yo woman, sarcoidosis. Has had recurrence of her dry cough, a tickle in throat. Frequent throat clearing. Has allergy symptoms, clear congestion. Does NSW occasionally. Not on an allergy regimen. PFT's done today, FEV1 and FVC down slightly from 2007.   [name] 07/23/10 -- Sarcoidosis, allergies/chronic cough, sinus disease (from sarcoid). Stopped lisinopril last time (2 mo ago). Tessalon Perles called in but she hasn't picked up. Continues to cough, has been doing so for a year!. She started nasal saline washes and fluticasone spray.   [name] 08/21/10 -- returns for sarcoidosis and cough. last time I treated her with prednisone for her progressive symptoms and cavitary changes on CT scan of the chest. She began to feel better on th epred - more energy, a bit less cough. She did have insomnia w the pred. Since 12/24 has had URI symptoms, lost her voice. Some relief from alka-seltzer plus, mucinex. Also on fluticasone spray and NSW two times a day.   [name] 09/16/09 -- sarcoidosis, abnormal CT scan. repeated 12/30. She has had persistent cough since last time, still w hoarse voice, globus sensation. Doing NSW's. Greenish/yellow stuff nasal drainage. Breathing is stable.   [name] 11/03/10 -- sarcoidosis, changes on CT scan. Rx for suspected chronic sinusitis w clinda 4 wks, mucous now clear, still drains. We also treated her with another round prednisone for more inflammation around UL cavity, now off. She is exercising more, cough is better.   [name] 04/14/11 -- sarcoidosis, changes on CT scan . She has had a return of her purulent nasal  drainage - went back on residual clinda that she had available. The mucous is starting to clear. She nasal saline washes daily. Takes loratadine and flonase. Her cough is more bothersome - more SOB. Denies rash.   [name] 05/08/11 -- sarcoidosis, abnormal CT scan. We repeated scan on 8/22 - shows active RML disease, apical B bullae, no nodules. She continues to feel fatigued, have dry cough. We discussed starting methotrexate given the progressive changes on Ct scan of the chest   [name] 05/26/11 -- sarcoidosis, abnormal CT scan as above. Started Pred 40mg  qd last visit.   [name] 06/24/11 -- sarcoidosis with cavitary dz on CT scan as below. Last time we decreased pred to 20mg  qd - has had more cough and drainage since that change. We discussed possibly starting MTX last time - LFT and hepatitis panel were normal last time. She wants to wait until after the holidays to start the MTX.   [name] 09/15/11 -- sarcoidosis with cavitary dz on CT scan. On chronic pred, and have been discussing possible change to MTX. She began to have sinus drainage, green,  and cough about 3 weeks ago. She has has some HA, no fevers. She is on loratadine. Had to use albuterol a few times since last time.   [name] 09/25/11 -- sarcoidosis with cavitary dz on CT scan. On chronic pred, and have been discussing possible change to MTX. Deferred last OV due to possible acute sinusitis, rx w augmentin. She is on [place], loratadine, nasal steroid. The drainage has cleared, no longer green.   [name] 10/30/11 -- sarcoidosis with  cavitary dz on CT scan. On chronic pred, last time we started MTX 7.5mg  qweek with folate qd. LFT's and CBC's have been stable/normal. Returns today for f/u. She has had a little nausea, sluggishness. Still has a dry cough, nasal gtt. [place] 2x a day, loratadine. She has occasional GERD sx. She has some chest tightness, doesn't really use SABA.  >>  Acute OV 11/19/11 Complains of pt c/o yellow prod cough and chest tightness and achy chest x  1week worsening. Pt has used OTC Mucinex with no relief. Patient complains of sinus pain, and pressure. Over-the-counter medications are not helping at all. Cough is keeping her up at night. She is currently on methotrexate and Prednisone 20mg  daily for her sarcoidosis. She denies any hemoptysis, nausea, vomiting, diarrhea, edema, or rash.  [name] 12/03/11 -- sarcoidosis with cavitary dz on CT scan. On chronic pred, on MTX 7.5mg  qweek with folate qd since 09/25/11. Was seen 3/28 as above for productive cough, ? Sinusitis/Bronchitis, treated with with augmentin. Drainage from her head and cough are better. LFT and CBC have been normal on the MTX. She held her MTX last week of March while she was sick, then started back. She feels that the MTX is making her more lethargic, feels "heavy".   [name] 02/04/12 -- sarcoidosis with cavitary dz on CT scan. On chronic pred, on MTX qweek with folate qd since 09/25/11. Since last time treated her for sinusitis w abx, now better. Last time we decreased the pred to 10mg .   [name] 04/14/12 -- sarcoidosis with cavitary dz on CT scan. On chronic pred, on MTX 12.5 mg qweek with folate qd since 09/25/11. She has been having more sinus drainage, green phlegm, no blood, + HA. She is having associated cough.   [name] 05/23/12 -- sarcoidosis with cavitary dz on CT scan. On chronic pred, on MTX 12.5 mg qweek with folate qd since 09/25/11. Last time we started Augmentin +  NSW for sinus dz. Her sx were better whiloe she was on the augmentin, then worsened when off  CBC    Component Value Date/Time   WBC 5.7 05/20/2012 0901   RBC 4.39 05/20/2012 0901   HGB 11.9* 05/20/2012 0901   HCT 37.6 05/20/2012 0901   PLT 217.0 05/20/2012 0901   MCV 85.7 05/20/2012 0901   MCHC 31.8 05/20/2012 0901   RDW 14.8* 05/20/2012 0901   LYMPHSABS 1.6 05/20/2012 0901   MONOABS 0.7 05/20/2012 0901   EOSABS 0.4 05/20/2012 0901   BASOSABS 0.1 05/20/2012 0901     EXAM :  Filed Vitals:   05/23/12 1032  BP: 122/80  Pulse: 81   Temp: 98 F (36.7 C)   Gen: Pleasant, well-nourished, in no distress,  normal affect  ENT: No lesions,  mouth clear,  oropharynx clear, no postnasal drip, max sinus tenderness   Neck: No JVD, no TMG, no carotid bruits  Lungs: No use of accessory muscles, no dullness to percussion, coarse BS with no wheezing   Cardiovascular: RRR, heart sounds normal, no murmur or gallops, no peripheral edema  Musculoskeletal: No deformities, no cyanosis or clubbing  Neuro: alert, non focal  Skin: Warm, no lesions or rashes   Chronic sinusitis Suspect this is the dx, but concerned that she may be flaring her sinus sarcoid - if so she will need pred again, probably eval by Dr [name] - treat with augmentin 6 weeks. Depending on response then make plans for CT scan or ENT visit  SARCOIDOSIS Pulm sx stable, but ?  Whether sh eis having sinus manifestations.  - continue mtx for now but may ned to add back pred - folate - LFT/CBC normal - rov 6 weeks

## 2012-07-05 ENCOUNTER — Ambulatory Visit: Payer: 59 | Admitting: Emergency Medicine

## 2012-07-07 ENCOUNTER — Other Ambulatory Visit (INDEPENDENT_AMBULATORY_CARE_PROVIDER_SITE_OTHER): Payer: 59

## 2012-07-07 ENCOUNTER — Ambulatory Visit (INDEPENDENT_AMBULATORY_CARE_PROVIDER_SITE_OTHER): Payer: 59 | Admitting: Emergency Medicine

## 2012-07-07 ENCOUNTER — Encounter: Payer: Self-pay | Admitting: Emergency Medicine

## 2012-07-07 VITALS — BP 124/92 | HR 66 | Temp 97.9°F | Ht 65.0 in | Wt 164.4 lb

## 2012-07-07 DIAGNOSIS — D869 Sarcoidosis, unspecified: Secondary | ICD-10-CM

## 2012-07-07 DIAGNOSIS — J329 Chronic sinusitis, unspecified: Secondary | ICD-10-CM

## 2012-07-07 DIAGNOSIS — R05 Cough: Secondary | ICD-10-CM

## 2012-07-07 LAB — HEPATIC FUNCTION PANEL
AST: 23 U/L (ref 0–37)
Albumin: 3.8 g/dL (ref 3.5–5.2)
Alkaline Phosphatase: 131 U/L — ABNORMAL HIGH (ref 39–117)
Bilirubin, Direct: 0.1 mg/dL (ref 0.0–0.3)
Total Protein: 7.1 g/dL (ref 6.0–8.3)

## 2012-07-07 LAB — CBC WITH DIFFERENTIAL/PLATELET
Basophils Relative: 1 % (ref 0.0–3.0)
Hemoglobin: 12.1 g/dL (ref 12.0–15.0)
Lymphocytes Relative: 30.8 % (ref 12.0–46.0)
Monocytes Relative: 9.3 % (ref 3.0–12.0)
Neutro Abs: 2.5 10*3/uL (ref 1.4–7.7)
Neutrophils Relative %: 52.1 % (ref 43.0–77.0)
RBC: 4.3 Mil/uL (ref 3.87–5.11)
WBC: 4.8 10*3/uL (ref 4.5–10.5)

## 2012-07-07 NOTE — Progress Notes (Signed)
[name] is a very pleasant 56 year old woman with a history of stable sarcoidosis. Last seen 11/08. She is not on maintanance meds for sarcoidosis. Since last visit has had sinus surgery for sarcoid involvement [name]). Post-op course comp by MRSA, treated for eradication. Tricor started in 8/09. 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   [name] 06/16/10 -- 56 yo woman, sarcoidosis. Has had recurrence of her dry cough, a tickle in throat. Frequent throat clearing. Has allergy symptoms, clear congestion. Does NSW occasionally. Not on an allergy regimen. PFT's done today, FEV1 and FVC down slightly from 2007.   [name] 07/23/10 -- Sarcoidosis, allergies/chronic cough, sinus disease (from sarcoid). Stopped lisinopril last time (2 mo ago). Tessalon Perles called in but she hasn't picked up. Continues to cough, has been doing so for a year!. She started nasal saline washes and fluticasone spray.   [name] 08/21/10 -- returns for sarcoidosis and cough. last time I treated her with prednisone for her progressive symptoms and cavitary changes on CT scan of the chest. She began to feel better on th epred - more energy, a bit less cough. She did have insomnia w the pred. Since 12/24 has had URI symptoms, lost her voice. Some relief from alka-seltzer plus, mucinex. Also on fluticasone spray and NSW two times a day.   [name] 09/16/09 -- sarcoidosis, abnormal CT scan. repeated 12/30. She has had persistent cough since last time, still w hoarse voice, globus sensation. Doing NSW's. Greenish/yellow stuff nasal drainage. Breathing is stable.   [name] 11/03/10 -- sarcoidosis, changes on CT scan. Rx for suspected chronic sinusitis w clinda 4 wks, mucous now clear, still drains. We also treated her with another round prednisone for more inflammation around UL cavity, now off. She is exercising more, cough is better.   [name] 04/14/11 -- sarcoidosis, changes on CT scan . She has had a return of her purulent nasal  drainage - went back on residual clinda that she had available. The mucous is starting to clear. She nasal saline washes daily. Takes loratadine and flonase. Her cough is more bothersome - more SOB. Denies rash.   [name] 05/08/11 -- sarcoidosis, abnormal CT scan. We repeated scan on 8/22 - shows active RML disease, apical B bullae, no nodules. She continues to feel fatigued, have dry cough. We discussed starting methotrexate given the progressive changes on Ct scan of the chest   [name] 05/26/11 -- sarcoidosis, abnormal CT scan as above. Started Pred 40mg  qd last visit.   [name] 06/24/11 -- sarcoidosis with cavitary dz on CT scan as below. Last time we decreased pred to 20mg  qd - has had more cough and drainage since that change. We discussed possibly starting MTX last time - LFT and hepatitis panel were normal last time. She wants to wait until after the holidays to start the MTX.   [name] 09/15/11 -- sarcoidosis with cavitary dz on CT scan. On chronic pred, and have been discussing possible change to MTX. She began to have sinus drainage, green,  and cough about 3 weeks ago. She has has some HA, no fevers. She is on loratadine. Had to use albuterol a few times since last time.   [name] 09/25/11 -- sarcoidosis with cavitary dz on CT scan. On chronic pred, and have been discussing possible change to MTX. Deferred last OV due to possible acute sinusitis, rx w augmentin. She is on [place], loratadine, nasal steroid. The drainage has cleared, no longer green.   [name] 10/30/11 -- sarcoidosis with  cavitary dz on CT scan. On chronic pred, last time we started MTX 7.5mg  qweek with folate qd. LFT's and CBC's have been stable/normal. Returns today for f/u. She has had a little nausea, sluggishness. Still has a dry cough, nasal gtt. NSW 2x a day, loratadine. She has occasional GERD sx. She has some chest tightness, doesn't really use SABA.  >>  Acute OV 11/19/11 Complains of pt c/o yellow prod cough and chest tightness and achy chest x  1week worsening. Pt has used OTC Mucinex with no relief. Patient complains of sinus pain, and pressure. Over-the-counter medications are not helping at all. Cough is keeping her up at night. She is currently on methotrexate and Prednisone 20mg  daily for her sarcoidosis. She denies any hemoptysis, nausea, vomiting, diarrhea, edema, or rash.  [name] 12/03/11 -- sarcoidosis with cavitary dz on CT scan. On chronic pred, on MTX 7.5mg  qweek with folate qd since 09/25/11. Was seen 3/28 as above for productive cough, ? Sinusitis/Bronchitis, treated with with augmentin. Drainage from her head and cough are better. LFT and CBC have been normal on the MTX. She held her MTX last week of March while she was sick, then started back. She feels that the MTX is making her more lethargic, feels "heavy".   [name] 02/04/12 -- sarcoidosis with cavitary dz on CT scan. On chronic pred, on MTX qweek with folate qd since 09/25/11. Since last time treated her for sinusitis w abx, now better. Last time we decreased the pred to 10mg .   [name] 04/14/12 -- sarcoidosis with cavitary dz on CT scan. On chronic pred, on MTX 12.5 mg qweek with folate qd since 09/25/11. She has been having more sinus drainage, green phlegm, no blood, + HA. She is having associated cough.   [name] 05/23/12 -- sarcoidosis with cavitary dz on CT scan. On chronic pred, on MTX 12.5 mg qweek with folate qd since 09/25/11. Last time we started Augmentin +  NSW for sinus dz. Her sx were better whiloe she was on the augmentin, then worsened when off  [name] 07/07/12 -- sarcoidosis with cavitary dz on CT scan. She is on MTX 12.5mg  + folate, off pred since Summer 2013. She was treated for chronic sinusitis, about to finish 6 weeks. She is still having throat clearing, dry cough, chest tightness. She has nasonex, has not been using astelin. She has never benefited in the past from PPI or SABA.   CBC    Component Value Date/Time   WBC 4.8 07/07/2012 0743   RBC 4.30 07/07/2012 0743   HGB  12.1 07/07/2012 0743   HCT 37.3 07/07/2012 0743   PLT 210.0 07/07/2012 0743   MCV 86.7 07/07/2012 0743   MCHC 32.5 07/07/2012 0743   RDW 15.9* 07/07/2012 0743   LYMPHSABS 1.5 07/07/2012 0743   MONOABS 0.4 07/07/2012 0743   EOSABS 0.3 07/07/2012 0743   BASOSABS 0.0 07/07/2012 0743    EXAM :  Filed Vitals:   07/07/12 1624  BP: 124/92  Pulse: 66  Temp: 97.9 F (36.6 C)   Gen: Pleasant, well-nourished, in no distress,  normal affect  ENT: No lesions,  mouth clear,  oropharynx clear, no postnasal drip, max sinus tenderness   Neck: No JVD, no TMG, no carotid bruits  Lungs: No use of accessory muscles, no dullness to percussion, coarse BS with no wheezing   Cardiovascular: RRR, heart sounds normal, no murmur or gallops, no peripheral edema  Musculoskeletal: No deformities, no cyanosis or clubbing  Neuro: alert, non focal  Skin: Warm, no lesions or rashes   Chronic sinusitis Appears to be improved, but now with dry cough and more UA symptoms.   COUGH - add back astelin to nasonex, loratadine  SARCOIDOSIS - continue MTX - repeat CT scan sinuses and chest to look for progression of disease. Depending on results may have to adjust MTX, go back to pred, refer back to Dr [name].

## 2012-07-07 NOTE — Assessment & Plan Note (Signed)
Appears to be improved, but now with dry cough and more UA symptoms.

## 2012-07-07 NOTE — Assessment & Plan Note (Signed)
-   continue MTX - repeat CT scan sinuses and chest to look for progression of disease. Depending on results may have to adjust MTX, go back to pred, refer back to Dr [name].

## 2012-07-07 NOTE — Assessment & Plan Note (Signed)
-   add back astelin to nasonex, loratadine

## 2012-07-07 NOTE — Patient Instructions (Addendum)
We will perform CT scan of your chest and sinuses Continue your methotrexate and folate Continue your nasonex Restart astelin nasal spray, 2 sprays each nostril 2-3 times a day Follow with Dr Delton Coombes in 1 month

## 2012-07-08 NOTE — Progress Notes (Signed)
Quick Note:  Called and spoke with patient, made her aware of results/recs as listed below per Dr. Delton Coombes. Pt verbalized understanding of this and nothing further needed at this time. ______

## 2012-07-13 ENCOUNTER — Ambulatory Visit (INDEPENDENT_AMBULATORY_CARE_PROVIDER_SITE_OTHER)
Admission: RE | Admit: 2012-07-13 | Discharge: 2012-07-13 | Disposition: A | Discharge status: Home / Self Care | Payer: 59 | Source: Ambulatory Visit | Attending: Emergency Medicine | Admitting: Emergency Medicine

## 2012-07-13 DIAGNOSIS — D869 Sarcoidosis, unspecified: Secondary | ICD-10-CM

## 2012-07-27 ENCOUNTER — Ambulatory Visit (INDEPENDENT_AMBULATORY_CARE_PROVIDER_SITE_OTHER): Payer: 59 | Admitting: Emergency Medicine

## 2012-07-27 ENCOUNTER — Encounter: Payer: Self-pay | Admitting: Emergency Medicine

## 2012-07-27 VITALS — BP 110/80 | HR 76 | Ht 65.0 in | Wt 164.2 lb

## 2012-07-27 DIAGNOSIS — R05 Cough: Secondary | ICD-10-CM

## 2012-07-27 DIAGNOSIS — D869 Sarcoidosis, unspecified: Secondary | ICD-10-CM

## 2012-07-27 NOTE — Patient Instructions (Addendum)
We will decrease the methotrexate to 10mg  weekly.  Continue your folate and getting lab work every month Stop astelin Continue nasonex and loratadine Try decongestants containing chlorpheniramine or brompheniramine Follow with Dr Delton Coombes in 2 months or sooner if you have any problems.

## 2012-07-27 NOTE — Assessment & Plan Note (Signed)
Stop the astelin (epistaxis), continue loratadine and fluticasone. Add chlorpheniramine or brompheniramine

## 2012-07-27 NOTE — Assessment & Plan Note (Signed)
Radiographically her sarcoidosis appears to be controlled. Not clear that her cough and nasal sx are sarcoidosis - may reflect 2 separate processes. She is hoping to get off maintenance therapy. If her recent problems dont relate to sarcoid then this might be possible. We will revisit next time and if she is stable consider stopping MTX. For now, will decrease to 10mg  weekly because she is having side effects of hair loss.

## 2012-07-27 NOTE — Progress Notes (Signed)
[name] is a very pleasant 56 year old woman with a history of stable sarcoidosis. Last seen 11/08. She is not on maintanance meds for sarcoidosis. Since last visit has had sinus surgery for sarcoid involvement [name]). Post-op course comp by MRSA, treated for eradication. Tricor started in 8/09. 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   [name] 06/16/10 -- 56 yo woman, sarcoidosis. Has had recurrence of her dry cough, a tickle in throat. Frequent throat clearing. Has allergy symptoms, clear congestion. Does NSW occasionally. Not on an allergy regimen. PFT's done today, FEV1 and FVC down slightly from 2007.   [name] 07/23/10 -- Sarcoidosis, allergies/chronic cough, sinus disease (from sarcoid). Stopped lisinopril last time (2 mo ago). Tessalon Perles called in but she hasn't picked up. Continues to cough, has been doing so for a year!. She started nasal saline washes and fluticasone spray.   [name] 08/21/10 -- returns for sarcoidosis and cough. last time I treated her with prednisone for her progressive symptoms and cavitary changes on CT scan of the chest. She began to feel better on th epred - more energy, a bit less cough. She did have insomnia w the pred. Since 12/24 has had URI symptoms, lost her voice. Some relief from alka-seltzer plus, mucinex. Also on fluticasone spray and [place] two times a day.   [name] 09/16/09 -- sarcoidosis, abnormal CT scan. repeated 12/30. She has had persistent cough since last time, still w hoarse voice, globus sensation. Doing NSW's. Greenish/yellow stuff nasal drainage. Breathing is stable.   [name] 11/03/10 -- sarcoidosis, changes on CT scan. Rx for suspected chronic sinusitis w clinda 4 wks, mucous now clear, still drains. We also treated her with another round prednisone for more inflammation around UL cavity, now off. She is exercising more, cough is better.   [name] 04/14/11 -- sarcoidosis, changes on CT scan . She has had a return of her purulent nasal  drainage - went back on residual clinda that she had available. The mucous is starting to clear. She nasal saline washes daily. Takes loratadine and flonase. Her cough is more bothersome - more SOB. Denies rash.   [name] 05/08/11 -- sarcoidosis, abnormal CT scan. We repeated scan on 8/22 - shows active RML disease, apical B bullae, no nodules. She continues to feel fatigued, have dry cough. We discussed starting methotrexate given the progressive changes on Ct scan of the chest   [name] 05/26/11 -- sarcoidosis, abnormal CT scan as above. Started Pred 40mg  qd last visit.   [name] 06/24/11 -- sarcoidosis with cavitary dz on CT scan as below. Last time we decreased pred to 20mg  qd - has had more cough and drainage since that change. We discussed possibly starting MTX last time - LFT and hepatitis panel were normal last time. She wants to wait until after the holidays to start the MTX.   [name] 09/15/11 -- sarcoidosis with cavitary dz on CT scan. On chronic pred, and have been discussing possible change to MTX. She began to have sinus drainage, green,  and cough about 3 weeks ago. She has has some HA, no fevers. She is on loratadine. Had to use albuterol a few times since last time.   [name] 09/25/11 -- sarcoidosis with cavitary dz on CT scan. On chronic pred, and have been discussing possible change to MTX. Deferred last OV due to possible acute sinusitis, rx w augmentin. She is on [place], loratadine, nasal steroid. The drainage has cleared, no longer green.   [name] 10/30/11 -- sarcoidosis with  cavitary dz on CT scan. On chronic pred, last time we started MTX 7.5mg  qweek with folate qd. LFT's and CBC's have been stable/normal. Returns today for f/u. She has had a little nausea, sluggishness. Still has a dry cough, nasal gtt. NSW 2x a day, loratadine. She has occasional GERD sx. She has some chest tightness, doesn't really use SABA.  >>  Acute OV 11/19/11 Complains of pt c/o yellow prod cough and chest tightness and achy chest x  1week worsening. Pt has used OTC Mucinex with no relief. Patient complains of sinus pain, and pressure. Over-the-counter medications are not helping at all. Cough is keeping her up at night. She is currently on methotrexate and Prednisone 20mg  daily for her sarcoidosis. She denies any hemoptysis, nausea, vomiting, diarrhea, edema, or rash.  [name] 12/03/11 -- sarcoidosis with cavitary dz on CT scan. On chronic pred, on MTX 7.5mg  qweek with folate qd since 09/25/11. Was seen 3/28 as above for productive cough, ? Sinusitis/Bronchitis, treated with with augmentin. Drainage from her head and cough are better. LFT and CBC have been normal on the MTX. She held her MTX last week of March while she was sick, then started back. She feels that the MTX is making her more lethargic, feels "heavy".   [name] 02/04/12 -- sarcoidosis with cavitary dz on CT scan. On chronic pred, on MTX qweek with folate qd since 09/25/11. Since last time treated her for sinusitis w abx, now better. Last time we decreased the pred to 10mg .   [name] 04/14/12 -- sarcoidosis with cavitary dz on CT scan. On chronic pred, on MTX 12.5 mg qweek with folate qd since 09/25/11. She has been having more sinus drainage, green phlegm, no blood, + HA. She is having associated cough.   [name] 05/23/12 -- sarcoidosis with cavitary dz on CT scan. On chronic pred, on MTX 12.5 mg qweek with folate qd since 09/25/11. Last time we started Augmentin +  NSW for sinus dz. Her sx were better whiloe she was on the augmentin, then worsened when off  [name] 07/07/12 -- sarcoidosis with cavitary dz on CT scan. She is on MTX 12.5mg  + folate, off pred since Summer 2013. She was treated for chronic sinusitis, about to finish 6 weeks. She is still having throat clearing, dry cough, chest tightness. She has nasonex, has not been using astelin. She has never benefited in the past from PPI or SABA.   [name] 07/27/12 -- sarcoidosis with cavitary dz on CT scan. She is on MTX 12.5mg  + folate, off pred  since Summer 2013. Last time checked CT scan sinuses and chest. The chest Ct scan is stable with some resolution of inflammatory changes. CT sinuses with stable chronic changes, chronic paranasal sinusitis. She continues to have globus sensation, dry cough. She has had some nose bleeding w astelin    EXAM :  Filed Vitals:   07/27/12 1645  BP: 110/80  Pulse: 76   Gen: Pleasant, well-nourished, in no distress,  normal affect  ENT: No lesions,  mouth clear,  oropharynx clear, no postnasal drip, max sinus tenderness   Neck: No JVD, no TMG, no carotid bruits  Lungs: No use of accessory muscles, no dullness to percussion, coarse BS with no wheezing   Cardiovascular: RRR, heart sounds normal, no murmur or gallops, no peripheral edema  Musculoskeletal: No deformities, no cyanosis or clubbing  Neuro: alert, non focal  Skin: Warm, no lesions or rashes    CT scan Chest 07/13/12 --  Comparison: 04/15/2011.  Findings: The chest wall is unremarkable and stable. No breast  masses are identified. Stable enlarged left axillary lymph node  measuring 3.1 x 1.5 cm. No new lymph nodes.  The bony thorax is intact. No destructive bone lesions or spinal  canal compromise.  The heart is normal and stable in size. No pericardial effusion.  There are enlarged mediastinal and hilar lymph nodes. These are  stable. The pretracheal node measures a maximum of 3 cm and this  is stable. The large subcarinal node measures 4.2 x 2.4 cm and is  stable. Several calcified mediastinal and hilar lymph nodes.  Examination of the lung parenchyma demonstrates stable right upper  lobe bronchiectasis with persistent cystic cavities. Interval  resolution of soft tissue thickening and inflammation around the  large right upper lobe lesion Stable left apical cystic cavity  also. Lower lobe pulmonary fibrosis with honeycombing is  unchanged. No findings for active alveolitis. Stable subpleural  nodules or lymph nodes on  the right. No pleural effusion, focal  infiltrate or edema.  The upper abdomen is unremarkable and stable.  IMPRESSION:  1. Stable left axillary, mediastinal and hilar lymphadenopathy.  2. Stable lung changes as discussed above. No new/acute pulmonary  findings.  3. Resolution of soft tissue thickening and inflammation around  the right upper lobe cavity.    CT sinuses 07/13/12 --  Comparison: 04/15/2011.  Findings: Negative visualized noncontrast brain parenchyma.  Grossly normal orbit and face soft tissues.  Visualized tympanic cavities are clear.  Visualized sphenoid sinuses are clear.  Previous maxillary antrostomies, turbinatectomies, and  ethmoidectomies. Stable mucoperiosteal thickening throughout the  maxillary and residual ethmoid sinuses.  Frontal sinuses left well visualized today but appear clear.  No acute osseous abnormality identified.  IMPRESSION:  Extensive changes of paranasal sinus surgery again noted with  chronic paranasal sinusitis, no significant change since 2012 and  no new areas of involvement.    SARCOIDOSIS Radiographically her sarcoidosis appears to be controlled. Not clear that her cough and nasal sx are sarcoidosis - may reflect 2 separate processes. She is hoping to get off maintenance therapy. If her recent problems dont relate to sarcoid then this might be possible. We will revisit next time and if she is stable consider stopping MTX. For now, will decrease to 10mg  weekly because she is having side effects of hair loss.   COUGH Stop the astelin (epistaxis), continue loratadine and fluticasone. Add chlorpheniramine or brompheniramine

## 2012-08-21 ENCOUNTER — Other Ambulatory Visit: Payer: Self-pay | Admitting: Emergency Medicine

## 2012-09-26 ENCOUNTER — Telehealth: Payer: Self-pay | Admitting: Emergency Medicine

## 2012-09-26 NOTE — Telephone Encounter (Signed)
Attempted to call patient 3x to schedule a follow up appointment. No return call back. Sent letter 09/26/12 °

## 2013-05-04 ENCOUNTER — Encounter: Payer: Self-pay | Admitting: Neurology

## 2013-05-04 ENCOUNTER — Ambulatory Visit (INDEPENDENT_AMBULATORY_CARE_PROVIDER_SITE_OTHER): Payer: 59 | Admitting: Neurology

## 2013-05-04 VITALS — BP 142/92 | HR 82 | Ht 65.5 in | Wt 150.0 lb

## 2013-05-04 DIAGNOSIS — R209 Unspecified disturbances of skin sensation: Secondary | ICD-10-CM

## 2013-05-04 DIAGNOSIS — M25519 Pain in unspecified shoulder: Secondary | ICD-10-CM

## 2013-05-04 DIAGNOSIS — R51 Headache: Secondary | ICD-10-CM

## 2013-05-04 DIAGNOSIS — R413 Other amnesia: Secondary | ICD-10-CM

## 2013-05-04 DIAGNOSIS — M25511 Pain in right shoulder: Secondary | ICD-10-CM

## 2013-05-04 DIAGNOSIS — R200 Anesthesia of skin: Secondary | ICD-10-CM

## 2013-05-04 DIAGNOSIS — M545 Low back pain, unspecified: Secondary | ICD-10-CM

## 2013-05-04 DIAGNOSIS — D869 Sarcoidosis, unspecified: Secondary | ICD-10-CM

## 2013-05-04 NOTE — Progress Notes (Signed)
Subjective:    Patient ID: [name] is a 57 y.o. female.  HPI  Huston Foley, MD, PhD [facility] [address], [address] [address] [place], [place] [address]  Dear Dr. Margretta Ditty,  I saw your patient, [name], upon your kind request in my neurologic clinic today for initial consultation of her numbness in her feet. The patient is unaccompanied today. As you know, [name] is a very pleasant 57 year old right-handed woman with an underlying medical history of vitamin D deficiency, insomnia, hypertension, hyperlipidemia, allergic rhinitis, sarcoidosis of the lung, and recent diagnosis of diabetes, who complains of new onset bilateral foot numbness for the past 2 months, extending further up the foot. Her symptoms started in May 2014 with numbness in the L digit 5 of her foot and has progressed. The numbness is not complete and feels in the inside but has progressed to the ball of the L foot and on the R she has involvement of the entire big toe. She has in the past couple of weeks had some heel pain bilaterally. She has a longstanding Hx of mild LBP, non-radiating. She is now off of MTX since January. She has had recurrent HAs, mostly sinus related, but has noted memory loss as well for the past years.  She denies RLS Sx. She takes an OTC sleep aid for sleep.  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  I reviewed recent laboratory data from your office from 03/17/2013 which included a normal CBC, ESR of 17, CRP of 2.8, ANA negative, rheumatoid factor of less than 7, TSH normal at 4.67, vitamin D at the lower end at 36, hemoglobin A1c borderline at 6.3, and CMP with fairly normal findings, and a lipid profile showing an LDL of 99.   Her Past Medical History Is Significant For: Past Medical History  Diagnosis Date  . Cough   . Sarcoidosis   . Hypertension   . Diabetes mellitus without complication   . Chronic sinusitis      Her Past Surgical History Is Significant For: Past Surgical History  Procedure Laterality Date  . Nose surgery    . Tubal ligation    . Vesicovaginal fistula closure w/ tah    . Lymphadenectomy      Her Family History Is Significant For: Family History  Problem Relation Age of Onset  . Stroke Father   . Heart murmur Father   . Diabetes Father   . Emphysema Mother   . COPD Mother   . Breast cancer Sister     Her Social History Is Significant For: History   Social History  . Marital Status: Divorced    Spouse Name: N/A    Number of Children: 2  . Years of Education: college   Occupational History  . CONSULTANT [employer]   Social History Main Topics  . Smoking status: Never Smoker   . Smokeless tobacco: Never Used  . Alcohol Use: No  . Drug Use: None  . Sexual Activity: Yes    Partners: Female   Other Topics Concern  . None   Social History Narrative  . None    Her Allergies Are:  No Known Allergies:   Her Current Medications Are:  Outpatient Encounter Prescriptions as of 05/04/2013  Medication Sig Dispense Refill  . aspirin 81 MG tablet Take 81 mg by mouth daily.      Marland Kitchen aspirin-acetaminophen-caffeine (EXCEDRIN MIGRAINE) 250-250-65 MG per tablet Take 1 tablet by mouth  every 6 (six) hours as needed for pain.      . Cholecalciferol (VITAMIN D-3) 5000 UNITS TABS Take 5,000 Units by mouth daily.      . ergocalciferol (VITAMIN D2) 50000 UNITS capsule Take 50,000 Units by mouth once a week.      . Estradiol (VIVELLE-DOT TD) Place onto the skin. Place a new patch every Wednesday and  Sunday      . fenofibrate 160 MG tablet Take 160 mg by mouth daily.      . fish oil-omega-3 fatty acids 1000 MG capsule Take 2 g by mouth daily.      . fluticasone (FLONASE) 50 MCG/ACT nasal spray Place 2 sprays into the nose daily.      . Ibuprofen (MOTRIN PO) Take 200 mg by mouth as needed.      . loratadine (CLARITIN) 10 MG tablet TAKE ONE TABLET BY MOUTH EVERY DAY  30  tablet  3  . losartan-hydrochlorothiazide (HYZAAR) 50-12.5 MG per tablet Take 1 tablet by mouth daily.      . Multiple Vitamin (MULTIVITAMIN) capsule Take 1 capsule by mouth daily.        . niacin (NIASPAN) 1000 MG CR tablet Take 1,000 mg by mouth at bedtime.      . Omega-3 Fatty Acids (FISH OIL) 1200 MG CAPS Take 1,200 capsules by mouth daily.      . [DISCONTINUED] Ascorbic Acid (VITAMIN C) 1000 MG tablet Take 1,000 mg by mouth daily.        . [DISCONTINUED] COD LIVER OIL PO Take 1 capsule by mouth daily.        Marland Kitchen azelastine (ASTELIN) 137 MCG/SPRAY nasal spray Place 2 sprays into the nose 2 (two) times daily. Use in each nostril as directed  30 mL  12  . fluticasone (FLONASE) 50 MCG/ACT nasal spray Place 1 spray into the nose daily as needed.       . [DISCONTINUED] losartan-hydrochlorothiazide (HYZAAR) 50-12.5 MG per tablet Take 1 tablet by mouth daily.        . [DISCONTINUED] methotrexate (RHEUMATREX) 2.5 MG tablet TAKE 5 TABLETS EVERY 7 DAYS  20 tablet  4   No facility-administered encounter medications on file as of 05/04/2013.  : Review of Systems:  Out of a complete 14 point review of systems, all are reviewed and negative with the exception of these symptoms as listed below:  Review of Systems  Respiratory: Positive for cough.   Musculoskeletal:       Joint Pain  Neurological: Positive for weakness, numbness and headaches.  Psychiatric/Behavioral: Positive for sleep disturbance.       Insomnia,Decreased Energy    Objective:  Neurologic Exam  Physical Exam Physical Examination:   Filed Vitals:   05/04/13 0822  BP: 142/92  Pulse: 82    General Examination: The patient is a very pleasant 57 y.o. female in no acute distress. She appears well-developed and well-nourished and well groomed. She is anxious.   HEENT: Normocephalic, atraumatic, pupils are equal, round and reactive to light and accommodation. Funduscopic exam is normal with sharp disc margins noted. Extraocular  tracking is good without limitation to gaze excursion or nystagmus noted. Normal smooth pursuit is noted. Hearing is grossly intact. Tympanic membranes are clear bilaterally. Face is symmetric with normal facial animation and normal facial sensation. Speech is clear with no dysarthria noted. There is no hypophonia. There is no lip, neck/head, jaw or voice tremor. Neck is supple with full range of passive and active motion. There  are no carotid bruits on auscultation. Oropharynx exam reveals: mild mouth dryness, adequate dental hygiene and mild airway crowding, due to elongated uvula. Mallampati is class I. Tongue protrudes centrally and palate elevates symmetrically. Tonsils are 1+ in size.   Chest: Clear to auscultation without wheezing, rhonchi or crackles noted.  Heart: S1+S2+0, regular and normal without murmurs, rubs or gallops noted.   Abdomen: Soft, non-tender and non-distended with normal bowel sounds appreciated on auscultation.  Extremities: There is no pitting edema in the distal lower extremities bilaterally. Pedal pulses are intact.  Skin: Warm and dry without trophic changes noted. There are no varicose veins.  Musculoskeletal: exam reveals no obvious joint deformities, or joint swelling or erythema. However, she is tender in both shoulder joints and has decrease in ROM in both shoulders.   Neurologically:  Mental status: The patient is awake, alert and oriented in all 4 spheres. Her memory, attention, language and knowledge are appropriate. There is no aphasia, agnosia, apraxia or anomia. Speech is clear with normal prosody and enunciation. Thought process is linear. Mood is congruent and affect is normal.  Cranial nerves are as described above under HEENT exam. In addition, shoulder shrug is normal with equal shoulder height noted. Motor exam: Normal bulk, strength and tone is noted. There is no drift, tremor or rebound. Romberg is negative. Reflexes are 2+ throughout. Toes are  downgoing bilaterally. Fine motor skills are intact with normal finger taps, normal hand movements, normal rapid alternating patting, normal foot taps and normal foot agility.  Cerebellar testing shows no dysmetria or intention tremor on finger to nose testing. Heel to shin is unremarkable bilaterally. There is no truncal or gait ataxia.  Sensory exam is intact to light touch, pinprick, vibration, temperature sense and proprioception in the upper and lower extremities, with the exception of mild decrease in PP and temperature sense in the L forefoot.   Gait, station and balance are unremarkable. No veering to one side is noted. No leaning to one side is noted. Posture is age-appropriate and stance is narrow based. No problems turning are noted. She turns en bloc. Tandem walk is unremarkable. Intact toe and heel stance is noted.               Assessment and Plan:   In summary, Meegan Betsch-Linden is a very pleasant 57 y.o.-year old female with a history of numbness in both feet, L more so than R, since 5/14, with progressive Sx reported. She has an underlying Hx of HTN, borderline DM, pulmonary sarcoid. She also c/o headaches and memory loss and a hx of chronic LBP. Her exam shows mild evidence of neuropathy and tenderness and decreased ROM in both shoulders. I had a long chat with the patient about my findings and her various Sx. she has no obvious etiology of her neuropathy. Her findings are mild and I reassured her in that regard. Nevertheless, I do believe we need to gather more data and I would like to proceed with further blood work and EMG nerve conduction velocity testing to further delineate her neuropathy. Since she has a long-standing history of low back pain I will go ahead and do an MRI of her lumbar spine as well. Because of her history of sarcoid and a complaint of recurrent headaches I will go ahead and do an MRI of her brain with and without contrast. She reports bilateral shoulder pain and  does indeed have decrease in range of motion and pain in her shoulders without obvious  shoulder joint swelling or erythema or change in skin temperature in that region and therefore I will go ahead and refer her to rheumatology. She has stopped her methotrexate in January of this year on her own but says that her pulmonologist had already been reducing it. At this juncture I will not start her on any new medication. She is advised that there is no medication that reverses or helps numbness. We can sometimes try medication such as gabapentin, Lyrica, Cymbalta, amitriptyline and so forth to alleviate painful neuropathy or paresthesias or burning foot pain. She does not really endorse any pain at all and has minimal to no paresthesias. I will see her back in about 3 months from now, sooner if the need arises. We will be calling her with her test results as we get them back. She was in agreement.   Thank you very much for allowing me to participate in the care of this nice patient. If I can be of any further assistance to you please do not hesitate to call me at [phone].  Sincerely,   Huston Foley, MD, PhD

## 2013-05-04 NOTE — Patient Instructions (Addendum)
I think overall you are doing fairly well but I do want to suggest a few things today:  As far as your medications are concerned, I would like to suggest no new medicine.    As far as diagnostic testing: MRIs of brain and L spine, blood work, EMG/NCV testing.   I would like to see you back in 3 months, sooner if we need to. Please call us with any interim questions, concerns, problems, updates or refill requests.  Brett Canales is my clinical assistant and will answer any of your questions and relay your messages to me and also relay most of my messages to you.  Our phone number is [phone]. We also have an after hours call service for urgent matters and there is a physician on-call for urgent questions. For any emergencies you know to call 911 or go to the nearest emergency room.

## 2013-05-08 LAB — IFE AND PE, SERUM
Alpha2 Glob SerPl Elph-Mcnc: 0.6 g/dL (ref 0.4–1.2)
B-Globulin SerPl Elph-Mcnc: 0.9 g/dL (ref 0.6–1.3)
Gamma Glob SerPl Elph-Mcnc: 1.4 g/dL (ref 0.5–1.6)
Globulin, Total: 3.2 g/dL (ref 2.0–4.5)

## 2013-05-08 LAB — B12 AND FOLATE PANEL
Folate: 12.9 ng/mL (ref >3.0)
Vitamin B-12: 836 pg/mL (ref 211–946)

## 2013-05-08 LAB — RPR: RPR: NONREACTIVE

## 2013-05-08 LAB — ALDOLASE: Aldolase: 5.2 U/L (ref 1.2–7.6)

## 2013-05-08 LAB — CK TOTAL AND CKMB (NOT AT ARMC): CK-MB Index: 1.7 ng/mL (ref 0.0–2.9)

## 2013-05-09 NOTE — Progress Notes (Signed)
Quick Note:  Please call and advise the patient that the recent labs we checked were within normal limits. We checked vitamin B12 level, protein in blood, muscle enzymes. No further action is required on these tests at this time. Please remind patient to keep any upcoming appointments or tests and to call us with any interim questions, concerns, problems or updates. Thanks,  Huston Foley, MD, PhD    ______

## 2013-05-10 NOTE — Progress Notes (Signed)
Quick Note:  I called and LMVM cell # that lab work normal. To call back if questions. ______

## 2013-05-17 ENCOUNTER — Encounter (INDEPENDENT_AMBULATORY_CARE_PROVIDER_SITE_OTHER): Payer: 59 | Admitting: Radiology

## 2013-05-17 ENCOUNTER — Ambulatory Visit (INDEPENDENT_AMBULATORY_CARE_PROVIDER_SITE_OTHER): Payer: 59 | Admitting: Diagnostic Neuroimaging

## 2013-05-17 ENCOUNTER — Telehealth: Payer: Self-pay | Admitting: Neurology

## 2013-05-17 DIAGNOSIS — M545 Low back pain, unspecified: Secondary | ICD-10-CM

## 2013-05-17 DIAGNOSIS — R413 Other amnesia: Secondary | ICD-10-CM

## 2013-05-17 DIAGNOSIS — R519 Headache, unspecified: Secondary | ICD-10-CM

## 2013-05-17 DIAGNOSIS — D869 Sarcoidosis, unspecified: Secondary | ICD-10-CM

## 2013-05-17 DIAGNOSIS — R209 Unspecified disturbances of skin sensation: Secondary | ICD-10-CM

## 2013-05-17 DIAGNOSIS — R200 Anesthesia of skin: Secondary | ICD-10-CM

## 2013-05-17 NOTE — Procedures (Signed)
   [facility]  NCS (NERVE CONDUCTION STUDY) WITH EMG (ELECTROMYOGRAPHY) REPORT   STUDY DATE: 05/17/13 PATIENT NAME: [name] DOB: [birth year] MRN: [id number]  ORDERING CLINICIAN: Huston Foley, MD PhD   TECHNOLOGIST: Kaylyn Lim ELECTROMYOGRAPHER: Glenford Bayley. Bairon Klemann, MD  CLINICAL INFORMATION: 57 year old female with numbness and pain in the feet.  FINDINGS: NERVE CONDUCTION STUDY: Bilateral peroneal and tibial motor responses have normal distal latencies, amplitudes, conduction velocities and F-wave latencies. Bilateral sural sensory responses have normal amplitudes and conduction velocities. Bilateral medial and lateral plantar nerve responses have decreased amplitudes. Left medial and bilateral lateral plantar nerve responses have slow conduction velocities.  NEEDLE ELECTROMYOGRAPHY: Examination of selected muscles left lower extremity (vastus medialis, tibialis anterior, gastrocnemius) and bilateral L5-S1 paraspinal muscles is normal. No abnormal spontaneous activity at rest and normal motor unit recruitment on exertion.  IMPRESSION:  Abnormal study demonstrating: 1. Bilateral medial and lateral plantar nerve responses are abnormal, and may reflect mild distal, axonal neuropathy.  2. No electrodiagnostic evidence of lumbar radiculopathy at this time on basis of normal needle EMG, but cannot be totally excluded on clinical basis.   INTERPRETING PHYSICIAN:  Suanne Marker, MD Certified in Neurology, Neurophysiology and Neuroimaging  [facility] [address] [place], [place] [address] [phone]

## 2013-05-19 NOTE — Telephone Encounter (Signed)
Spoke with the patient and let her know that Dr Frances Furbish had referred her to a rheumatologist and that the appointment has been made.  I gave her the contact information for the referred physician.

## 2013-05-31 ENCOUNTER — Ambulatory Visit (INDEPENDENT_AMBULATORY_CARE_PROVIDER_SITE_OTHER): Payer: 59

## 2013-05-31 DIAGNOSIS — M545 Low back pain, unspecified: Secondary | ICD-10-CM

## 2013-05-31 DIAGNOSIS — D869 Sarcoidosis, unspecified: Secondary | ICD-10-CM

## 2013-05-31 DIAGNOSIS — R200 Anesthesia of skin: Secondary | ICD-10-CM

## 2013-05-31 DIAGNOSIS — R413 Other amnesia: Secondary | ICD-10-CM

## 2013-05-31 DIAGNOSIS — R209 Unspecified disturbances of skin sensation: Secondary | ICD-10-CM

## 2013-05-31 DIAGNOSIS — R51 Headache: Secondary | ICD-10-CM

## 2013-05-31 MED ORDER — GADOPENTETATE DIMEGLUMINE 469.01 MG/ML IV SOLN: 14.0000 mL | Freq: Once | INTRAVENOUS | Status: AC | PRN

## 2013-06-01 NOTE — Progress Notes (Signed)
Quick Note:  Please call patient regarding the recent brain MRI: The brain scan showed a normal structure of the brain and no significant volume loss which we call atrophy. There were changes in the deeper structures of the brain, which we call white matter changes or microvascular changes. These were reported as very minute in her case.  These are tiny white spots, that occur with time and are seen in a variety of conditions, including with normal aging, chronic hypertension, chronic headaches, especially migraine HAs, chronic diabetes, chronic hyperlipidemia. These are not strokes and no mass or lesion or contrast enhancement was seen which is reassuring. Again, there were no acute findings, such as a stroke, or mass or blood products. No further action is required on this test at this time, other than re-enforcing the importance of good blood pressure control, good cholesterol control, good blood sugar control, and weight management.   Her lumbar spine MRI was reported as normal. Please remind patient to keep any upcoming appointments or tests and to call us with any interim questions, concerns, problems or updates. Thanks,  Huston Foley, MD, PhD    ______

## 2013-06-02 NOTE — Progress Notes (Signed)
Quick Note:  Call pt about her MRI of the brain and lumbar spine, pt verbalized understanding, but still has some questions and concerns for Dr. Frances Furbish, please call pt. ______

## 2013-07-24 ENCOUNTER — Telehealth: Payer: Self-pay | Admitting: Emergency Medicine

## 2013-07-24 NOTE — Telephone Encounter (Signed)
I called and spoke with pt. Some sxs she has been having she thinks is related to her sarcoid. I advised her RB was not in and did not have anything until 08/07/13. She did not want to see someone else. Appt scheduled. Nothing further needed

## 2013-07-26 ENCOUNTER — Telehealth: Payer: Self-pay | Admitting: *Deleted

## 2013-07-26 DIAGNOSIS — G609 Hereditary and idiopathic neuropathy, unspecified: Secondary | ICD-10-CM

## 2013-07-26 NOTE — Telephone Encounter (Signed)
She continues to have problems with her feet.  Seems to be getting worse.  Please call.

## 2013-07-31 ENCOUNTER — Other Ambulatory Visit: Payer: Self-pay | Admitting: Emergency Medicine

## 2013-07-31 DIAGNOSIS — M810 Age-related osteoporosis without current pathological fracture: Secondary | ICD-10-CM

## 2013-07-31 MED ORDER — GABAPENTIN 100 MG PO CAPS: ORAL_CAPSULE | ORAL | Status: DC

## 2013-07-31 NOTE — Telephone Encounter (Signed)
Please advise her to get started on Neurontin (gabapentin) 100 mg strength: Take 1 pill nightly at bedtime for 1 week, then 2 pills nightly for 1 week, then 3 pills each night thereafter. The most common side effects reported are sedation or sleepiness. Rare side effects include balance problems, confusion.  We will reevaluate during her appointment in January. Please advise her that the prescription was sent to her pharmacy. Please ask her to call with any interim questions.

## 2013-08-01 NOTE — Telephone Encounter (Signed)
I called patient and left VM, that gabapentin has been called in to [facility] on [address]. I left info about instructions for taking along with side effects. I asked that she begin this as soon as possible and to give it a few weeks to become effective. Please call back with any further questions.

## 2013-08-03 ENCOUNTER — Encounter (INDEPENDENT_AMBULATORY_CARE_PROVIDER_SITE_OTHER): Payer: Self-pay | Admitting: General Surgery

## 2013-08-03 ENCOUNTER — Encounter (INDEPENDENT_AMBULATORY_CARE_PROVIDER_SITE_OTHER): Payer: Self-pay

## 2013-08-03 ENCOUNTER — Ambulatory Visit (INDEPENDENT_AMBULATORY_CARE_PROVIDER_SITE_OTHER): Payer: 59 | Admitting: General Surgery

## 2013-08-03 ENCOUNTER — Other Ambulatory Visit: Payer: Self-pay | Admitting: Internal Medicine

## 2013-08-03 VITALS — BP 122/78 | HR 68 | Temp 98.5°F | Resp 16 | Ht 65.0 in | Wt 153.4 lb

## 2013-08-03 DIAGNOSIS — M25511 Pain in right shoulder: Secondary | ICD-10-CM

## 2013-08-03 DIAGNOSIS — A630 Anogenital (venereal) warts: Secondary | ICD-10-CM | POA: Insufficient documentation

## 2013-08-03 MED ORDER — IMIQUIMOD 5 % EX CREA: TOPICAL_CREAM | CUTANEOUS | Status: AC

## 2013-08-03 NOTE — Progress Notes (Signed)
Patient ID: [name], female   DOB: 1955-11-27, 57 y.o.   MRN: [id number]  Chief Complaint  Patient presents with  . Rectal Problems    HPI [name] is a 57 y.o. female.  We're asked to see the patient in consultation by Dr. Lorenso Courier to evaluate her for anal condyloma. The patient is a 57 year old black female who has had anal warts for many years. She used to be managed by her GYN doctor but he passed away about 3 years ago. She denies any rectal pain. She denies any burning or itching. She denies any bleeding with her bowel movements.  HPI  Past Medical History  Diagnosis Date  . Cough   . Sarcoidosis   . Hypertension   . Diabetes mellitus without complication   . Chronic sinusitis     Past Surgical History  Procedure Laterality Date  . Nose surgery    . Tubal ligation    . Vesicovaginal fistula closure w/ tah    . Lymphadenectomy      Family History  Problem Relation Age of Onset  . Stroke Father   . Heart murmur Father   . Diabetes Father   . Emphysema Mother   . COPD Mother   . Breast cancer Sister     Social History History  Substance Use Topics  . Smoking status: Never Smoker   . Smokeless tobacco: Never Used  . Alcohol Use: No    No Known Allergies  Current Outpatient Prescriptions  Medication Sig Dispense Refill  . aspirin 81 MG tablet Take 81 mg by mouth daily.      Marland Kitchen aspirin-acetaminophen-caffeine (EXCEDRIN MIGRAINE) 250-250-65 MG per tablet Take 1 tablet by mouth every 6 (six) hours as needed for pain.      . Cholecalciferol (VITAMIN D-3) 5000 UNITS TABS Take 5,000 Units by mouth daily.      . Estradiol (VIVELLE-DOT TD) Place onto the skin. Place a new patch every Wednesday and  Sunday      . fenofibrate 160 MG tablet Take 160 mg by mouth daily.      . fish oil-omega-3 fatty acids 1000 MG capsule Take 2 g by mouth daily.      . fluticasone (FLONASE) 50 MCG/ACT nasal spray Place 2 sprays into the nose daily.      Marland Kitchen gabapentin (NEURONTIN)  100 MG capsule Take 1 pill nightly at bedtime for 1 week, then 2 pills nightly for 1 week, then 3 pills each night thereafter.  90 capsule  3  . Ibuprofen (MOTRIN PO) Take 200 mg by mouth as needed.      . loratadine (CLARITIN) 10 MG tablet TAKE ONE TABLET BY MOUTH EVERY DAY  30 tablet  3  . losartan-hydrochlorothiazide (HYZAAR) 50-12.5 MG per tablet Take 1 tablet by mouth daily.      . Multiple Vitamin (MULTIVITAMIN) capsule Take 1 capsule by mouth daily.        . niacin (NIASPAN) 1000 MG CR tablet Take 1,000 mg by mouth at bedtime.      . Omega-3 Fatty Acids (FISH OIL) 1200 MG CAPS Take 1,200 capsules by mouth daily.      Marland Kitchen azelastine (ASTELIN) 137 MCG/SPRAY nasal spray Place 2 sprays into the nose 2 (two) times daily. Use in each nostril as directed  30 mL  12  . ergocalciferol (VITAMIN D2) 50000 UNITS capsule Take 50,000 Units by mouth once a week.      . fluticasone (FLONASE) 50 MCG/ACT nasal  spray Place 1 spray into the nose daily as needed.       . imiquimod (ALDARA) 5 % cream Apply to affected area three times weekly  12 each  1  . [DISCONTINUED] methotrexate (RHEUMATREX) 2.5 MG tablet 5 tablets every 7 days  20 tablet  5   No current facility-administered medications for this visit.    Review of Systems Review of Systems  Constitutional: Negative.   HENT: Negative.   Eyes: Negative.   Respiratory: Negative.   Cardiovascular: Negative.   Gastrointestinal: Negative.   Endocrine: Negative.   Genitourinary: Negative.   Musculoskeletal: Negative.   Skin: Negative.   Allergic/Immunologic: Negative.   Neurological: Negative.   Hematological: Negative.   Psychiatric/Behavioral: Negative.     Blood pressure 122/78, pulse 68, temperature 98.5 F (36.9 C), temperature source Temporal, resp. rate 16, height 5\' 5"  (1.651 m), weight 153 lb 6.4 oz (69.582 kg).  Physical Exam Physical Exam  Constitutional: She is oriented to person, place, and time. She appears well-developed and  well-nourished.  HENT:  Head: Normocephalic and atraumatic.  Eyes: Conjunctivae and EOM are normal. Pupils are equal, round, and reactive to light.  Neck: Normal range of motion. Neck supple.  Cardiovascular: Normal rate, regular rhythm and normal heart sounds.   Pulmonary/Chest: Effort normal and breath sounds normal.  Abdominal: Soft. Bowel sounds are normal.  Genitourinary:  There are small areas on either side of the rectum of condyloma and one small area anteriorly. There does not appear to be any disease at the anal verge.  Musculoskeletal: Normal range of motion.  Lymphadenopathy:    She has no cervical adenopathy.  Neurological: She is alert and oriented to person, place, and time.  Skin: Skin is warm and dry.  Psychiatric: She has a normal mood and affect. Her behavior is normal.    Data Reviewed As above  Assessment    The patient appears to have 3 small areas of anal condyloma     Plan    After discussion with our colorectal specialist we will try starting her on Aldara cream to see if this controls the disease. I will plan to have her followup with Dr. Maisie Fus in about 3 months for further evaluation        TOTH III,Mikal Blasdell S 08/03/2013, 3:33 PM

## 2013-08-03 NOTE — Patient Instructions (Signed)
Try aldera cream

## 2013-08-04 ENCOUNTER — Encounter: Payer: Self-pay | Admitting: Emergency Medicine

## 2013-08-04 ENCOUNTER — Ambulatory Visit (INDEPENDENT_AMBULATORY_CARE_PROVIDER_SITE_OTHER): Payer: 59 | Admitting: Emergency Medicine

## 2013-08-04 VITALS — BP 120/82 | HR 65 | Ht 65.0 in | Wt 158.0 lb

## 2013-08-04 DIAGNOSIS — D869 Sarcoidosis, unspecified: Secondary | ICD-10-CM

## 2013-08-04 NOTE — Assessment & Plan Note (Signed)
Off rx since march 2014. Was concerning for progressive cavitary disease.  - need repeat Ct scan now to assess for progression - no rx at this time - follow after scan to review

## 2013-08-04 NOTE — Progress Notes (Signed)
[name] is a very pleasant 57 year old woman with a history of stable sarcoidosis. Last seen 11/08. She is not on maintanance meds for sarcoidosis. Since last visit has had sinus surgery for sarcoid involvement [name]). Post-op course comp by MRSA, treated for eradication. Tricor started in 8/09. 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   [name] 06/16/10 -- 57 yo woman, sarcoidosis. Has had recurrence of her dry cough, a tickle in throat. Frequent throat clearing. Has allergy symptoms, clear congestion. Does NSW occasionally. Not on an allergy regimen. PFT's done today, FEV1 and FVC down slightly from 2007.   [name] 07/23/10 -- Sarcoidosis, allergies/chronic cough, sinus disease (from sarcoid). Stopped lisinopril last time (2 mo ago). Tessalon Perles called in but she hasn't picked up. Continues to cough, has been doing so for a year!. She started nasal saline washes and fluticasone spray.   [name] 08/21/10 -- returns for sarcoidosis and cough. last time I treated her with prednisone for her progressive symptoms and cavitary changes on CT scan of the chest. She began to feel better on th epred - more energy, a bit less cough. She did have insomnia w the pred. Since 12/24 has had URI symptoms, lost her voice. Some relief from alka-seltzer plus, mucinex. Also on fluticasone spray and NSW two times a day.   [name] 09/16/09 -- sarcoidosis, abnormal CT scan. repeated 12/30. She has had persistent cough since last time, still w hoarse voice, globus sensation. Doing NSW's. Greenish/yellow stuff nasal drainage. Breathing is stable.   [name] 11/03/10 -- sarcoidosis, changes on CT scan. Rx for suspected chronic sinusitis w clinda 4 wks, mucous now clear, still drains. We also treated her with another round prednisone for more inflammation around UL cavity, now off. She is exercising more, cough is better.   [name] 04/14/11 -- sarcoidosis, changes on CT scan . She has had a return of her purulent nasal  drainage - went back on residual clinda that she had available. The mucous is starting to clear. She nasal saline washes daily. Takes loratadine and flonase. Her cough is more bothersome - more SOB. Denies rash.   [name] 05/08/11 -- sarcoidosis, abnormal CT scan. We repeated scan on 8/22 - shows active RML disease, apical B bullae, no nodules. She continues to feel fatigued, have dry cough. We discussed starting methotrexate given the progressive changes on Ct scan of the chest   [name] 05/26/11 -- sarcoidosis, abnormal CT scan as above. Started Pred 40mg  qd last visit.   [name] 06/24/11 -- sarcoidosis with cavitary dz on CT scan as below. Last time we decreased pred to 20mg  qd - has had more cough and drainage since that change. We discussed possibly starting MTX last time - LFT and hepatitis panel were normal last time. She wants to wait until after the holidays to start the MTX.   [name] 09/15/11 -- sarcoidosis with cavitary dz on CT scan. On chronic pred, and have been discussing possible change to MTX. She began to have sinus drainage, green,  and cough about 3 weeks ago. She has has some HA, no fevers. She is on loratadine. Had to use albuterol a few times since last time.   [name] 09/25/11 -- sarcoidosis with cavitary dz on CT scan. On chronic pred, and have been discussing possible change to MTX. Deferred last OV due to possible acute sinusitis, rx w augmentin. She is on [place], loratadine, nasal steroid. The drainage has cleared, no longer green.   [name] 10/30/11 -- sarcoidosis with  cavitary dz on CT scan. On chronic pred, last time we started MTX 7.5mg  qweek with folate qd. LFT's and CBC's have been stable/normal. Returns today for f/u. She has had a little nausea, sluggishness. Still has a dry cough, nasal gtt. NSW 2x a day, loratadine. She has occasional GERD sx. She has some chest tightness, doesn't really use SABA.  >>  Acute OV 11/19/11 Complains of pt c/o yellow prod cough and chest tightness and achy chest x  1week worsening. Pt has used OTC Mucinex with no relief. Patient complains of sinus pain, and pressure. Over-the-counter medications are not helping at all. Cough is keeping her up at night. She is currently on methotrexate and Prednisone 20mg  daily for her sarcoidosis. She denies any hemoptysis, nausea, vomiting, diarrhea, edema, or rash.  [name] 12/03/11 -- sarcoidosis with cavitary dz on CT scan. On chronic pred, on MTX 7.5mg  qweek with folate qd since 09/25/11. Was seen 3/28 as above for productive cough, ? Sinusitis/Bronchitis, treated with with augmentin. Drainage from her head and cough are better. LFT and CBC have been normal on the MTX. She held her MTX last week of March while she was sick, then started back. She feels that the MTX is making her more lethargic, feels "heavy".   [name] 02/04/12 -- sarcoidosis with cavitary dz on CT scan. On chronic pred, on MTX qweek with folate qd since 09/25/11. Since last time treated her for sinusitis w abx, now better. Last time we decreased the pred to 10mg .   [name] 04/14/12 -- sarcoidosis with cavitary dz on CT scan. On chronic pred, on MTX 12.5 mg qweek with folate qd since 09/25/11. She has been having more sinus drainage, green phlegm, no blood, + HA. She is having associated cough.   [name] 05/23/12 -- sarcoidosis with cavitary dz on CT scan. On chronic pred, on MTX 12.5 mg qweek with folate qd since 09/25/11. Last time we started Augmentin +  NSW for sinus dz. Her sx were better whiloe she was on the augmentin, then worsened when off  [name] 07/07/12 -- sarcoidosis with cavitary dz on CT scan. She is on MTX 12.5mg  + folate, off pred since Summer 2013. She was treated for chronic sinusitis, about to finish 6 weeks. She is still having throat clearing, dry cough, chest tightness. She has nasonex, has not been using astelin. She has never benefited in the past from PPI or SABA.   [name] 07/27/12 -- sarcoidosis with cavitary dz on CT scan. She is on MTX 12.5mg  + folate, off pred  since Summer 2013. Last time checked CT scan sinuses and chest. The chest Ct scan is stable with some resolution of inflammatory changes. CT sinuses with stable chronic changes, chronic paranasal sinusitis. She continues to have globus sensation, dry cough. She has had some nose bleeding w astelin   [name] 08/04/13 -- sarcoidosis with cavitary dz on CT scan. She is on MTX 12.5mg  + folate, off pred since Summer 2013. We decreased MTX last time to 10mg . I haven't seen her for a year. She has ben dealing with other problems, seeing Neuro for peripheral neuropathy, not felt to relate to sarcoidosis. She went ahead and decreased the MTX down to zero - she has been off since March. Her breathing has been stable, she still has allergies sx and drainage, cough.    EXAM Ceasar Mons Vitals:   08/04/13 1207  BP: 120/82  Pulse: 65  Height: 5\' 5"  (1.651 m)  Weight: 158 lb (71.668 kg)  SpO2: 100%   Gen: Pleasant, well-nourished, in no distress,  normal affect  ENT: No lesions,  mouth clear,  oropharynx clear, no postnasal drip, max sinus tenderness   Neck: No JVD, no TMG, no carotid bruits  Lungs: No use of accessory muscles, no dullness to percussion, coarse BS with no wheezing   Cardiovascular: RRR, heart sounds normal, no murmur or gallops, no peripheral edema  Musculoskeletal: No deformities, no cyanosis or clubbing  Neuro: alert, non focal  Skin: Warm, no lesions or rashes    CT scan Chest 07/13/12 --  Comparison: 04/15/2011.  Findings: The chest wall is unremarkable and stable. No breast  masses are identified. Stable enlarged left axillary lymph node  measuring 3.1 x 1.5 cm. No new lymph nodes.  The bony thorax is intact. No destructive bone lesions or spinal  canal compromise.  The heart is normal and stable in size. No pericardial effusion.  There are enlarged mediastinal and hilar lymph nodes. These are  stable. The pretracheal node measures a maximum of 3 cm and this  is stable. The  large subcarinal node measures 4.2 x 2.4 cm and is  stable. Several calcified mediastinal and hilar lymph nodes.  Examination of the lung parenchyma demonstrates stable right upper  lobe bronchiectasis with persistent cystic cavities. Interval  resolution of soft tissue thickening and inflammation around the  large right upper lobe lesion Stable left apical cystic cavity  also. Lower lobe pulmonary fibrosis with honeycombing is  unchanged. No findings for active alveolitis. Stable subpleural  nodules or lymph nodes on the right. No pleural effusion, focal  infiltrate or edema.  The upper abdomen is unremarkable and stable.  IMPRESSION:  1. Stable left axillary, mediastinal and hilar lymphadenopathy.  2. Stable lung changes as discussed above. No new/acute pulmonary  findings.  3. Resolution of soft tissue thickening and inflammation around  the right upper lobe cavity.   CT sinuses 07/13/12 --  Comparison: 04/15/2011.  Findings: Negative visualized noncontrast brain parenchyma.  Grossly normal orbit and face soft tissues.  Visualized tympanic cavities are clear.  Visualized sphenoid sinuses are clear.  Previous maxillary antrostomies, turbinatectomies, and  ethmoidectomies. Stable mucoperiosteal thickening throughout the  maxillary and residual ethmoid sinuses.  Frontal sinuses left well visualized today but appear clear.  No acute osseous abnormality identified.  IMPRESSION:  Extensive changes of paranasal sinus surgery again noted with  chronic paranasal sinusitis, no significant change since 2012 and  no new areas of involvement.    SARCOIDOSIS Off rx since march 2014. Was concerning for progressive cavitary disease.  - need repeat Ct scan now to assess for progression - no rx at this time - follow after scan to review

## 2013-08-04 NOTE — Patient Instructions (Signed)
We will schedule CT scan of the chest to compare with your priors.  No new medications at this time.  Follow with Dr Delton Coombes next available appointment to review the scan

## 2013-08-08 ENCOUNTER — Other Ambulatory Visit: Payer: 59

## 2013-08-09 ENCOUNTER — Ambulatory Visit (INDEPENDENT_AMBULATORY_CARE_PROVIDER_SITE_OTHER)
Admission: RE | Admit: 2013-08-09 | Discharge: 2013-08-09 | Disposition: A | Discharge status: Home / Self Care | Payer: 59 | Source: Ambulatory Visit | Attending: Emergency Medicine | Admitting: Emergency Medicine

## 2013-08-09 DIAGNOSIS — D869 Sarcoidosis, unspecified: Secondary | ICD-10-CM

## 2013-08-11 ENCOUNTER — Telehealth: Payer: Self-pay | Admitting: Emergency Medicine

## 2013-08-11 ENCOUNTER — Ambulatory Visit (INDEPENDENT_AMBULATORY_CARE_PROVIDER_SITE_OTHER): Payer: 59 | Admitting: Emergency Medicine

## 2013-08-11 ENCOUNTER — Encounter: Payer: Self-pay | Admitting: Emergency Medicine

## 2013-08-11 ENCOUNTER — Ambulatory Visit
Admission: RE | Admit: 2013-08-11 | Discharge: 2013-08-11 | Disposition: A | Discharge status: Home / Self Care | Payer: 59 | Source: Ambulatory Visit | Attending: Internal Medicine | Admitting: Internal Medicine

## 2013-08-11 VITALS — BP 120/80 | HR 63 | Ht 65.0 in | Wt 160.0 lb

## 2013-08-11 DIAGNOSIS — M25511 Pain in right shoulder: Secondary | ICD-10-CM

## 2013-08-11 DIAGNOSIS — D869 Sarcoidosis, unspecified: Secondary | ICD-10-CM

## 2013-08-11 NOTE — Telephone Encounter (Signed)
This pt was already seen today and CT discussed.  Nothing further is needed

## 2013-08-11 NOTE — Patient Instructions (Signed)
CT scan of the chest is stable compared with November 2013 Do not need to start any new medications at this time We will repeat her CT scan in 6 months to look for interval change Please follow with Dr. Delton Coombes in 6 months after the scan to review

## 2013-08-11 NOTE — Progress Notes (Signed)
[name] is a very pleasant 57 year old woman with a history of stable sarcoidosis. Last seen 11/08. She is not on maintanance meds for sarcoidosis. Since last visit has had sinus surgery for sarcoid involvement [name]). Post-op course comp by MRSA, treated for eradication. Tricor started in 8/09. 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   [name] 06/16/10 -- 57 yo woman, sarcoidosis. Has had recurrence of her dry cough, a tickle in throat. Frequent throat clearing. Has allergy symptoms, clear congestion. Does NSW occasionally. Not on an allergy regimen. PFT's done today, FEV1 and FVC down slightly from 2007.   [name] 07/23/10 -- Sarcoidosis, allergies/chronic cough, sinus disease (from sarcoid). Stopped lisinopril last time (2 mo ago). Tessalon Perles called in but she hasn't picked up. Continues to cough, has been doing so for a year!. She started nasal saline washes and fluticasone spray.   [name] 08/21/10 -- returns for sarcoidosis and cough. last time I treated her with prednisone for her progressive symptoms and cavitary changes on CT scan of the chest. She began to feel better on th epred - more energy, a bit less cough. She did have insomnia w the pred. Since 12/24 has had URI symptoms, lost her voice. Some relief from alka-seltzer plus, mucinex. Also on fluticasone spray and NSW two times a day.   [name] 09/16/09 -- sarcoidosis, abnormal CT scan. repeated 12/30. She has had persistent cough since last time, still w hoarse voice, globus sensation. Doing NSW's. Greenish/yellow stuff nasal drainage. Breathing is stable.   [name] 11/03/10 -- sarcoidosis, changes on CT scan. Rx for suspected chronic sinusitis w clinda 4 wks, mucous now clear, still drains. We also treated her with another round prednisone for more inflammation around UL cavity, now off. She is exercising more, cough is better.   [name] 04/14/11 -- sarcoidosis, changes on CT scan . She has had a return of her purulent nasal  drainage - went back on residual clinda that she had available. The mucous is starting to clear. She nasal saline washes daily. Takes loratadine and flonase. Her cough is more bothersome - more SOB. Denies rash.   [name] 05/08/11 -- sarcoidosis, abnormal CT scan. We repeated scan on 8/22 - shows active RML disease, apical B bullae, no nodules. She continues to feel fatigued, have dry cough. We discussed starting methotrexate given the progressive changes on Ct scan of the chest   [name] 05/26/11 -- sarcoidosis, abnormal CT scan as above. Started Pred 40mg  qd last visit.   [name] 06/24/11 -- sarcoidosis with cavitary dz on CT scan as below. Last time we decreased pred to 20mg  qd - has had more cough and drainage since that change. We discussed possibly starting MTX last time - LFT and hepatitis panel were normal last time. She wants to wait until after the holidays to start the MTX.   [name] 09/15/11 -- sarcoidosis with cavitary dz on CT scan. On chronic pred, and have been discussing possible change to MTX. She began to have sinus drainage, green,  and cough about 3 weeks ago. She has has some HA, no fevers. She is on loratadine. Had to use albuterol a few times since last time.   [name] 09/25/11 -- sarcoidosis with cavitary dz on CT scan. On chronic pred, and have been discussing possible change to MTX. Deferred last OV due to possible acute sinusitis, rx w augmentin. She is on [place], loratadine, nasal steroid. The drainage has cleared, no longer green.   [name] 10/30/11 -- sarcoidosis with  cavitary dz on CT scan. On chronic pred, last time we started MTX 7.5mg  qweek with folate qd. LFT's and CBC's have been stable/normal. Returns today for f/u. She has had a little nausea, sluggishness. Still has a dry cough, nasal gtt. NSW 2x a day, loratadine. She has occasional GERD sx. She has some chest tightness, doesn't really use SABA.  >>  Acute OV 11/19/11 Complains of pt c/o yellow prod cough and chest tightness and achy chest x  1week worsening. Pt has used OTC Mucinex with no relief. Patient complains of sinus pain, and pressure. Over-the-counter medications are not helping at all. Cough is keeping her up at night. She is currently on methotrexate and Prednisone 20mg  daily for her sarcoidosis. She denies any hemoptysis, nausea, vomiting, diarrhea, edema, or rash.  [name] 12/03/11 -- sarcoidosis with cavitary dz on CT scan. On chronic pred, on MTX 7.5mg  qweek with folate qd since 09/25/11. Was seen 3/28 as above for productive cough, ? Sinusitis/Bronchitis, treated with with augmentin. Drainage from her head and cough are better. LFT and CBC have been normal on the MTX. She held her MTX last week of March while she was sick, then started back. She feels that the MTX is making her more lethargic, feels "heavy".   [name] 02/04/12 -- sarcoidosis with cavitary dz on CT scan. On chronic pred, on MTX qweek with folate qd since 09/25/11. Since last time treated her for sinusitis w abx, now better. Last time we decreased the pred to 10mg .   [name] 04/14/12 -- sarcoidosis with cavitary dz on CT scan. On chronic pred, on MTX 12.5 mg qweek with folate qd since 09/25/11. She has been having more sinus drainage, green phlegm, no blood, + HA. She is having associated cough.   [name] 05/23/12 -- sarcoidosis with cavitary dz on CT scan. On chronic pred, on MTX 12.5 mg qweek with folate qd since 09/25/11. Last time we started Augmentin +  NSW for sinus dz. Her sx were better whiloe she was on the augmentin, then worsened when off  [name] 07/07/12 -- sarcoidosis with cavitary dz on CT scan. She is on MTX 12.5mg  + folate, off pred since Summer 2013. She was treated for chronic sinusitis, about to finish 6 weeks. She is still having throat clearing, dry cough, chest tightness. She has nasonex, has not been using astelin. She has never benefited in the past from PPI or SABA.   [name] 07/27/12 -- sarcoidosis with cavitary dz on CT scan. She is on MTX 12.5mg  + folate, off pred  since Summer 2013. Last time checked CT scan sinuses and chest. The chest Ct scan is stable with some resolution of inflammatory changes. CT sinuses with stable chronic changes, chronic paranasal sinusitis. She continues to have globus sensation, dry cough. She has had some nose bleeding w astelin   [name] 08/04/13 -- sarcoidosis with cavitary dz on CT scan. She is on MTX 12.5mg  + folate, off pred since Summer 2013. We decreased MTX last time to 10mg . I haven't seen her for a year. She has been dealing with other problems, seeing Neuro for peripheral neuropathy, not felt to relate to sarcoidosis. She went ahead and decreased the MTX down to zero - she has been off since March. Her breathing has been stable, she still has allergies sx and drainage, cough.   [name] 08/11/13 -- sarcoidosis with cavitary dz on CT scan, now off MTX and pred. We repeated CT scan on 12/18 > no significant changes compared with 06/2012.  She is feeling well, stable.    EXAM :  Filed Vitals:   08/11/13 1131  BP: 120/80  Pulse: 63  Height: 5\' 5"  (1.651 m)  Weight: 160 lb (72.576 kg)  SpO2: 100%   Gen: Pleasant, well-nourished, in no distress,  normal affect  ENT: No lesions,  mouth clear,  oropharynx clear, no postnasal drip, max sinus tenderness   Neck: No JVD, no TMG, no carotid bruits  Lungs: No use of accessory muscles, no dullness to percussion, coarse BS with no wheezing   Cardiovascular: RRR, heart sounds normal, no murmur or gallops, no peripheral edema  Musculoskeletal: No deformities, no cyanosis or clubbing  Neuro: alert, non focal  Skin: Warm, no lesions or rashes    CT scan Chest 07/13/12 --  Comparison: 04/15/2011.  Findings: The chest wall is unremarkable and stable. No breast  masses are identified. Stable enlarged left axillary lymph node  measuring 3.1 x 1.5 cm. No new lymph nodes.  The bony thorax is intact. No destructive bone lesions or spinal  canal compromise.  The heart is normal and  stable in size. No pericardial effusion.  There are enlarged mediastinal and hilar lymph nodes. These are  stable. The pretracheal node measures a maximum of 3 cm and this  is stable. The large subcarinal node measures 4.2 x 2.4 cm and is  stable. Several calcified mediastinal and hilar lymph nodes.  Examination of the lung parenchyma demonstrates stable right upper  lobe bronchiectasis with persistent cystic cavities. Interval  resolution of soft tissue thickening and inflammation around the  large right upper lobe lesion Stable left apical cystic cavity  also. Lower lobe pulmonary fibrosis with honeycombing is  unchanged. No findings for active alveolitis. Stable subpleural  nodules or lymph nodes on the right. No pleural effusion, focal  infiltrate or edema.  The upper abdomen is unremarkable and stable.  IMPRESSION:  1. Stable left axillary, mediastinal and hilar lymphadenopathy.  2. Stable lung changes as discussed above. No new/acute pulmonary  findings.  3. Resolution of soft tissue thickening and inflammation around  the right upper lobe cavity.   CT sinuses 07/13/12 --  Comparison: 04/15/2011.  Findings: Negative visualized noncontrast brain parenchyma.  Grossly normal orbit and face soft tissues.  Visualized tympanic cavities are clear.  Visualized sphenoid sinuses are clear.  Previous maxillary antrostomies, turbinatectomies, and  ethmoidectomies. Stable mucoperiosteal thickening throughout the  maxillary and residual ethmoid sinuses.  Frontal sinuses left well visualized today but appear clear.  No acute osseous abnormality identified.  IMPRESSION:  Extensive changes of paranasal sinus surgery again noted with  chronic paranasal sinusitis, no significant change since 2012 and  no new areas of involvement.    SARCOIDOSIS Clinically stable off of prednisone and methotrexate. I was concerned that she wound showed radiographic progression off of controller medications  but her CT scan performed 08/10/13 is stable. I believe we can defer immunosuppression for now. We will plan to repeat her CT scan of the chest in 6 months.

## 2013-08-11 NOTE — Assessment & Plan Note (Signed)
Clinically stable off of prednisone and methotrexate. I was concerned that she wound showed radiographic progression off of controller medications but her CT scan performed 08/10/13 is stable. I believe we can defer immunosuppression for now. We will plan to repeat her CT scan of the chest in 6 months.

## 2013-09-04 ENCOUNTER — Encounter (INDEPENDENT_AMBULATORY_CARE_PROVIDER_SITE_OTHER): Payer: 59 | Admitting: Ophthalmology

## 2013-09-04 DIAGNOSIS — H43819 Vitreous degeneration, unspecified eye: Secondary | ICD-10-CM

## 2013-09-04 DIAGNOSIS — H35039 Hypertensive retinopathy, unspecified eye: Secondary | ICD-10-CM

## 2013-09-04 DIAGNOSIS — H2510 Age-related nuclear cataract, unspecified eye: Secondary | ICD-10-CM

## 2013-09-04 DIAGNOSIS — I10 Essential (primary) hypertension: Secondary | ICD-10-CM

## 2013-09-07 ENCOUNTER — Encounter (INDEPENDENT_AMBULATORY_CARE_PROVIDER_SITE_OTHER): Payer: Self-pay

## 2013-09-07 ENCOUNTER — Encounter: Payer: Self-pay | Admitting: Neurology

## 2013-09-07 ENCOUNTER — Ambulatory Visit (INDEPENDENT_AMBULATORY_CARE_PROVIDER_SITE_OTHER): Payer: 59 | Admitting: Neurology

## 2013-09-07 VITALS — BP 125/85 | HR 67 | Temp 98.2°F | Ht 65.5 in | Wt 156.0 lb

## 2013-09-07 DIAGNOSIS — D869 Sarcoidosis, unspecified: Secondary | ICD-10-CM

## 2013-09-07 DIAGNOSIS — G609 Hereditary and idiopathic neuropathy, unspecified: Secondary | ICD-10-CM

## 2013-09-07 DIAGNOSIS — M25519 Pain in unspecified shoulder: Secondary | ICD-10-CM

## 2013-09-07 DIAGNOSIS — M25511 Pain in right shoulder: Secondary | ICD-10-CM

## 2013-09-07 DIAGNOSIS — R209 Unspecified disturbances of skin sensation: Secondary | ICD-10-CM

## 2013-09-07 DIAGNOSIS — M25512 Pain in left shoulder: Secondary | ICD-10-CM

## 2013-09-07 DIAGNOSIS — R202 Paresthesia of skin: Secondary | ICD-10-CM

## 2013-09-07 DIAGNOSIS — R200 Anesthesia of skin: Secondary | ICD-10-CM

## 2013-09-07 NOTE — Patient Instructions (Signed)
You can try Neurontin (gabapentin) 100 mg strength: Take 1 pill nightly at bedtime for 1 week, then 2 pills nightly for 1 week, then 3 pills each night thereafter. The most common side effects reported are sedation or sleepiness. Rare side effects include balance problems, confusion. You can also take it as needed, one pill at bedtime as needed, but you may want to skip your sleep aid at the time.  We will do heavy metal testing and call you with the results. FU in about 3 months.

## 2013-09-07 NOTE — Progress Notes (Signed)
Subjective:    Patient ID: [name] is a 58 y.o. female.  HPI  Interim history:   [name] is a 58 year old right-handed woman with an underlying medical history of vitamin D deficiency, insomnia, hypertension, hyperlipidemia, allergic rhinitis, sarcoidosis of the lung, and recent diagnosis of diabetes, who presents for followup consultation of her bilateral foot numbness, starting in 5/14 with discomfort in her feet reported, worse at night. She is unaccompanied today. I first met her on 05/04/2013, to which time I suggested further workup in the form of blood work and EMG nerve conduction testing. I also suggested lumbar spine MRI of brain MRI with and without contrast based on her history of sarcoidosis and recurrent headaches reported. Her brain MRI with and without contrast from 05/31/2013 showed: Equivocal MRI brain (with and without) demonstrating: Few punctate foci of non-specific gliosis. No acute findings. Her lumbar spine MRI without contrast from 05/31/2013 was reported as normal. I also referred her to rheumatology d/t R shoulder pain. She had a R shoulder MRI on 08/11/13: Mild rotator cuff tendinosis with small focus of partial bursal surface insertional tearing of the infraspinous tendon. No full-thickness tendon tear, tendon retraction or muscular atrophy. 2. Mild subacromial -subdeltoid bursitis. 3. Mild thickening and edema of the joint capsule suggesting possible adhesive capsulitis. 4. No evidence of labral or biceps tendon tear. She saw Dr. Amil Amen and had R shoulder steroid injection which helped.  She had a chest CT wo contrast on 08/09/13:  She had an EMG and nerve conduction study on 05/17/2013 and I reviewed the test results: Abnormal study demonstrating: 1. Bilateral medial and lateral plantar nerve responses are  abnormal, and may reflect mild distal, axonal neuropathy. 2. No electrodiagnostic evidence of lumbar radiculopathy at this time on basis of normal needle EMG,  but cannot be totally  excluded on clinical basis. For symptomatic treatment I called in gabapentin in December after she called back reporting that her symptoms in her feet or worse but she did not fill the prescription as she read the side effects and wanted to understand how it would work for her. Her blood work, including aldolase, CK, ACE level, B12, IFE and RPR were unremarkable. Previously with her primary care physician she had ANA, ESR, CRP, all of which were unremarkable, hemoglobin A1c was 6.2 in July 2014.  Today, she reports that her symptoms affect both feet but has recently stabilized she feels. She has no symptoms in her hands. Her shoulder pain is improved since the steroid injection.  Her symptoms started in May 2014 with numbness in the L digit 5 of her foot and has progressed. The numbness is not complete and feels in the inside but has progressed to the ball of the L foot and on the R she has involvement of the entire big toe. She has in the past couple of weeks had some heel pain bilaterally. She has a longstanding Hx of mild LBP, non-radiating. She is now off of MTX since January. She has had recurrent HAs, mostly sinus related, but has noted memory loss as well for the past years.  She denies RLS Sx. She takes an OTC sleep aid for sleep.   Her Past Medical History Is Significant For: Past Medical History  Diagnosis Date  . Cough   . Sarcoidosis   . Hypertension   . Diabetes mellitus without complication   . Chronic sinusitis     Her Past Surgical History Is Significant For: Past Surgical History  Procedure Laterality Date  . Nose surgery    . Tubal ligation    . Vesicovaginal fistula closure w/ tah    . Lymphadenectomy      Her Family History Is Significant For: Family History  Problem Relation Age of Onset  . Stroke Father   . Heart murmur Father   . Diabetes Father   . Emphysema Mother   . COPD Mother   . Breast cancer Sister     Her Social History Is  Significant For: History   Social History  . Marital Status: Divorced    Spouse Name: N/A    Number of Children: 2  . Years of Education: college   Occupational History  . [employer] History Main Topics  . Smoking status: Never Smoker   . Smokeless tobacco: Never Used  . Alcohol Use: No  . Drug Use: None  . Sexual Activity: Yes    Partners: Female   Other Topics Concern  . None   Social History Narrative  . None    Her Allergies Are:  No Known Allergies:   Her Current Medications Are:  Outpatient Encounter Prescriptions as of 09/07/2013  Medication Sig  . aspirin 81 MG tablet Take 81 mg by mouth daily.  Marland Kitchen aspirin-acetaminophen-caffeine (EXCEDRIN MIGRAINE) 250-250-65 MG per tablet Take 1 tablet by mouth every 6 (six) hours as needed for pain.  Marland Kitchen azelastine (ASTELIN) 137 MCG/SPRAY nasal spray Place 2 sprays into the nose 2 (two) times daily. Use in each nostril as directed  . Cholecalciferol (VITAMIN D-3) 5000 UNITS TABS Take 5,000 Units by mouth daily.  . ergocalciferol (VITAMIN D2) 50000 UNITS capsule Take 50,000 Units by mouth once a week.  . Estradiol (VIVELLE-DOT TD) Place onto the skin. Place a new patch every Wednesday and  Sunday  . fenofibrate 160 MG tablet Take 160 mg by mouth daily.  . fish oil-omega-3 fatty acids 1000 MG capsule Take 2 g by mouth daily.  . fluticasone (FLONASE) 50 MCG/ACT nasal spray Place 1 spray into the nose daily as needed.   . gabapentin (NEURONTIN) 100 MG capsule Take 1 pill nightly at bedtime for 1 week, then 2 pills nightly for 1 week, then 3 pills each night thereafter.  . Ibuprofen (MOTRIN PO) Take 200 mg by mouth as needed.  . imiquimod (ALDARA) 5 % cream Apply to affected area three times weekly  . loratadine (CLARITIN) 10 MG tablet TAKE ONE TABLET BY MOUTH EVERY DAY  . losartan-hydrochlorothiazide (HYZAAR) 50-12.5 MG per tablet Take 1 tablet by mouth daily.  . Multiple Vitamin (MULTIVITAMIN) capsule Take  1 capsule by mouth daily.    . Naproxen Sodium (ALEVE PO) Take 1 tablet by mouth every evening.  . niacin (NIASPAN) 1000 MG CR tablet Take 1,000 mg by mouth at bedtime.   Review of Systems:  Out of a complete 14 point review of systems, all are reviewed and negative with the exception of these symptoms as listed below:   Review of Systems  Constitutional: Negative.   HENT: Negative.   Eyes: Negative.   Respiratory: Negative.   Cardiovascular: Negative.   Gastrointestinal: Negative.   Endocrine: Negative.   Genitourinary: Negative.   Musculoskeletal: Positive for arthralgias.  Skin: Negative.   Allergic/Immunologic: Negative.   Neurological: Positive for numbness.  Hematological: Negative.   Psychiatric/Behavioral: Negative.     Objective:  Neurologic Exam  Physical Exam Physical Examination:   Filed Vitals:   09/07/13 1544  BP: 125/85  Pulse: 67  Temp: 98.2 F (36.8 C)   General Examination: The patient is a very pleasant 58 y.o. female in no acute distress. She appears well-developed and well-nourished and well groomed. She is mildly anxious.   HEENT: Normocephalic, atraumatic, pupils are equal, round and reactive to light and accommodation. Funduscopic exam is normal with sharp disc margins noted. Extraocular tracking is good without limitation to gaze excursion or nystagmus noted. Normal smooth pursuit is noted. Hearing is grossly intact. Face is symmetric with normal facial animation and normal facial sensation. Speech is clear with no dysarthria noted. There is no hypophonia. There is no lip, neck/head, jaw or voice tremor. Neck is supple with full range of passive and active motion. There are no carotid bruits on auscultation. Oropharynx exam reveals: mild mouth dryness, adequate dental hygiene and mild airway crowding, due to elongated uvula. Mallampati is class I. Tongue protrudes centrally and palate elevates symmetrically. Tonsils are 1+ in size.   Chest: Clear to  auscultation without wheezing, rhonchi or crackles noted.  Heart: S1+S2+0, regular and normal without murmurs, rubs or gallops noted.   Abdomen: Soft, non-tender and non-distended with normal bowel sounds appreciated on auscultation.  Extremities: There is no pitting edema in the distal lower extremities bilaterally. Pedal pulses are intact.  Skin: Warm and dry without trophic changes noted. There are no varicose veins.  Musculoskeletal: exam reveals no obvious joint deformities, or joint swelling or erythema. Her shoulder ROM is improved.   Neurologically:  Mental status: The patient is awake, alert and oriented in all 4 spheres. Her memory, attention, language and knowledge are appropriate. There is no aphasia, agnosia, apraxia or anomia. Speech is clear with normal prosody and enunciation. Thought process is linear. Mood is congruent and affect is normal.  Cranial nerves are as described above under HEENT exam. In addition, shoulder shrug is normal with equal shoulder height noted. Motor exam: Normal bulk, strength and tone is noted. There is no drift, tremor or rebound. Romberg is negative. Reflexes are 2+ throughout. Toes are downgoing bilaterally. Fine motor skills are intact with normal finger taps, normal hand movements, normal rapid alternating patting, normal foot taps and normal foot agility.  Cerebellar testing shows no dysmetria or intention tremor on finger to nose testing. Heel to shin is unremarkable bilaterally. There is no truncal or gait ataxia.  Sensory exam is intact to light touch, pinprick, vibration, temperature sense and proprioception in the upper and lower extremities, with the exception of mild decrease in PP, vibration and temperature sense in the feet.    Gait, station and balance are unremarkable. No veering to one side is noted. No leaning to one side is noted. Posture is age-appropriate and stance is narrow based. No problems turning are noted. She turns en bloc.  Tandem walk is unremarkable. Intact toe and heel stance is noted.               Assessment and Plan:   In summary, [name] is a very pleasant 58 year old female with a history of numbness in both feet, since 5/14, with progressive Sx reported, but recent stabilization of her numbness and discomfort. She has an underlying Hx of HTN, borderline DM, pulmonary sarcoid. She also c/o headaches and memory loss and a hx of chronic LBP. Her exam shows mild evidence of neuropathy and her recent nerve conduction velocity testing also showed mild axonal neuropathy. The etiology is unclear at this time. She is borderline diabetic. She had prior exposure to  methotrexate but stopped it in January and her symptoms started in May of last year. She does feel that she has plateaued at this time. I had a long chat today with the patient, primarily to discuss all her test results. Thus far, her brain MRI with contrast was nonrevealing in particular in the context of a known diagnosis of pulmonary sarcoidosis which is stable and in remission at this time. Her blood work was unremarkable thus far. We will do heavy metal testing at this time. She is advised that the gabapentin was for symptomatic treatment of her discomfort. We talked about potential side effects. She can also use it as needed but is advised to skip her over-the-counter sleep aid when she starts gabapentin so not to have too much drowsiness. Symptomatic treatment in the form of gabapentin, Lyrica, Cymbalta, amitriptyline and so forth to can alleviate symptoms of painful neuropathy or paresthesias or burning foot pain but does not alter the progression or treatable cause. I will see her back in about 3 months from now, sooner if the need arises. We will be calling her with her test result on the heavy metal testing. She was in agreement.

## 2013-10-16 ENCOUNTER — Encounter (INDEPENDENT_AMBULATORY_CARE_PROVIDER_SITE_OTHER): Payer: Self-pay | Admitting: General Surgery

## 2013-11-22 ENCOUNTER — Other Ambulatory Visit (INDEPENDENT_AMBULATORY_CARE_PROVIDER_SITE_OTHER): Payer: Self-pay

## 2013-11-22 DIAGNOSIS — Z0289 Encounter for other administrative examinations: Secondary | ICD-10-CM

## 2013-11-24 LAB — HEAVY METALS PROFILE II, BLOOD
ARSENIC: 5 ug/L (ref 2–23)
Cadmium: NOT DETECTED ug/L (ref 0.0–1.2)
LEAD, BLOOD: 2 ug/dL (ref 0–19)
MERCURY: 1.3 ug/L (ref 0.0–14.9)

## 2013-11-24 NOTE — Progress Notes (Signed)
Quick Note:  Please advise patient and her heavy metal profile in the blood was normal. Star Age, MD, PhD [facility] ([facility]  ______

## 2013-12-06 ENCOUNTER — Ambulatory Visit (INDEPENDENT_AMBULATORY_CARE_PROVIDER_SITE_OTHER): Payer: 59 | Admitting: Neurology

## 2013-12-06 ENCOUNTER — Encounter: Payer: Self-pay | Admitting: Neurology

## 2013-12-06 VITALS — BP 127/80 | HR 65 | Temp 97.7°F | Ht 65.5 in | Wt 159.0 lb

## 2013-12-06 DIAGNOSIS — R200 Anesthesia of skin: Secondary | ICD-10-CM

## 2013-12-06 DIAGNOSIS — R209 Unspecified disturbances of skin sensation: Secondary | ICD-10-CM

## 2013-12-06 DIAGNOSIS — M25512 Pain in left shoulder: Secondary | ICD-10-CM

## 2013-12-06 DIAGNOSIS — R202 Paresthesia of skin: Secondary | ICD-10-CM

## 2013-12-06 DIAGNOSIS — G609 Hereditary and idiopathic neuropathy, unspecified: Secondary | ICD-10-CM

## 2013-12-06 DIAGNOSIS — M25539 Pain in unspecified wrist: Secondary | ICD-10-CM

## 2013-12-06 DIAGNOSIS — M25579 Pain in unspecified ankle and joints of unspecified foot: Secondary | ICD-10-CM

## 2013-12-06 DIAGNOSIS — M25511 Pain in right shoulder: Secondary | ICD-10-CM

## 2013-12-06 DIAGNOSIS — M25519 Pain in unspecified shoulder: Secondary | ICD-10-CM

## 2013-12-06 NOTE — Patient Instructions (Signed)
You may have a condition called peripheral neuropathy, i. e. nerve damage. There is no specific treatment for most neuropathies. The most common cause for neuropathy is diabetes in this [place], in which case, tight glucose control is key. Other causes include thyroid disease, and some vitamin deficiencies. Certain medications such as chemotherapy agents and other chemicals or toxins including alcohol can cause neuropathy. There are some genetic conditions or hereditary neuropathies. Typically patients will report a family history of neuropathy in those conditions. There are cases associated with cancers and autoimmune conditions. Most neuropathies are progressive unless a root cause can be found and treated. For most neuropathies there is no actual cure or reversing of symptoms. Painful neuropathy can be difficult to treat symptomatically, but there are some medications available to ease the symptoms. Electrophysiologic testing with nerve conduction velocity studies and EMG (muscle testing) do not always pick up neuropathies that affect the smallest fibers. Other common tests include different type of blood work, and rarely, spinal fluid testing, and sometimes we resort to asking for a nerve and muscle biopsy.  We will repeat inflammatory markers and call you back. Use gabapentin as needed and you may need to see Dr. Amil Amen again.

## 2013-12-06 NOTE — Progress Notes (Signed)
Subjective:    Patient ID: [name] is a 58 y.o. female.  HPI    Interim history:   [name] is a 58 year old right-handed woman with an underlying medical history of vitamin D deficiency, insomnia, hypertension, hyperlipidemia, allergic rhinitis, sarcoidosis of the lung, and recent diagnosis of diabetes, who presents for followup consultation of her bilateral foot numbness, starting in 5/14 with discomfort in her feet, worse at night. She is unaccompanied today. I last saw her on 09/07/2013, at which time she reported recent stabilization of her numbness and discomfort in both feet. Her exam showed evidence of mild neuropathy and recent nerve conduction velocity testing showed mild axonal neuropathy with unclear etiology at the time. She is borderline diabetic. She has a history of prior exposure to methotrexate. She was advised to use gabapentin for symptomatic treatment of her discomfort. We did a heavy metal testing in her blood which was normal.  Today, she reports more issues with pain, rather than paresthesias or numbness, the pain has affected primarily her b/l elbows, wrists and ankles, R a little more than left with no obvious redness, swelling or crepitations, but some popping noted and stiffness particularly in the mornings. She has taken gabapentin occasionally, which helps some, but primarily makes her sleepy. She tries to exercise and eat healthy. She has not been back to see Dr. Amil Amen.   I first met her on 05/04/2013, at which time I suggested further workup in the form of blood work and EMG/NCV testing. I also suggested lumbar spine MRI and brain MRI with and without contrast based on her history of sarcoidosis and recurrent headaches reported. Her brain MRI with and without contrast from 05/31/2013 showed: Equivocal MRI brain (with and without) demonstrating: Few punctate foci of non-specific gliosis. No acute findings. Her lumbar spine MRI without contrast from 05/31/2013  was reported as normal. I also referred her to rheumatology d/t R shoulder pain. She had a R shoulder MRI on 08/11/13: Mild rotator cuff tendinosis with small focus of partial bursal surface insertional tearing of the infraspinous tendon. No full-thickness tendon tear, tendon retraction or muscular atrophy. 2. Mild subacromial -subdeltoid bursitis. 3. Mild thickening and edema of the joint capsule suggesting possible adhesive capsulitis. 4. No evidence of labral or biceps tendon tear. She saw Dr. Amil Amen and had b/l shoulder steroid injection which helped.  She had a chest CT wo contrast on 08/09/13: Similar appearance of left axillary, mediastinal and hilar adenopathy compatible with the clinical history of sarcoid. Interstitial lung disease compatible with usual interstitial pneumonitis.  She had an EMG and nerve conduction study on 05/17/2013: Abnormal study demonstrating: 1. Bilateral medial and lateral plantar nerve responses are  abnormal, and may reflect mild distal, axonal neuropathy. 2. No electrodiagnostic evidence of lumbar radiculopathy at this time on basis of normal needle EMG, but cannot be totally excluded on clinical basis.  For symptomatic treatment I called in gabapentin in December 2014, after she called back reporting that her symptoms in her feet were worse but she did not fill the prescription as she read the side effects and wanted to understand how it would work for her.  Her blood work, including aldolase, CK, ACE level, B12, IFE and RPR were unremarkable. Previously with her primary care physician she had ANA, ESR, CRP, all of which were unremarkable, hemoglobin A1c was 6.2 in July 2014.  Her symptoms started in May 2014 with numbness in the L digit 5 of her foot and has progressed. The  numbness is not complete and feels in the inside but has progressed to the ball of the L foot and on the R she has involvement of the entire big toe. She has in the past couple of weeks had some heel  pain bilaterally. She has a longstanding Hx of mild LBP, non-radiating. She has been off of MTX since January 2014. She has had recurrent HAs, mostly sinus related, but has noted memory loss as well for the past years. She denied RLS Sx. She takes an OTC sleep aid for sleep.    Her Past Medical History Is Significant For: Past Medical History  Diagnosis Date  . Cough   . Sarcoidosis   . Hypertension   . Diabetes mellitus without complication   . Chronic sinusitis     Her Past Surgical History Is Significant For: Past Surgical History  Procedure Laterality Date  . Nose surgery    . Tubal ligation    . Vesicovaginal fistula closure w/ tah    . Lymphadenectomy      Her Family History Is Significant For: Family History  Problem Relation Age of Onset  . Stroke Father   . Heart murmur Father   . Diabetes Father   . Emphysema Mother   . COPD Mother   . Breast cancer Sister     Her Social History Is Significant For: History   Social History  . Marital Status: Divorced    Spouse Name: N/A    Number of Children: 2  . Years of Education: college   Occupational History  . [employer] History Main Topics  . Smoking status: Never Smoker   . Smokeless tobacco: Never Used  . Alcohol Use: No  . Drug Use: None  . Sexual Activity: Yes    Partners: Female   Other Topics Concern  . None   Social History Narrative  . None    Her Allergies Are:  No Known Allergies:   Her Current Medications Are:  Outpatient Encounter Prescriptions as of 12/06/2013  Medication Sig  . aspirin 81 MG tablet Take 81 mg by mouth daily.  Marland Kitchen aspirin-acetaminophen-caffeine (EXCEDRIN MIGRAINE) 250-250-65 MG per tablet Take 1 tablet by mouth every 6 (six) hours as needed for pain.  . Cholecalciferol (VITAMIN D-3) 5000 UNITS TABS Take 5,000 Units by mouth daily.  . Estradiol (VIVELLE-DOT TD) Place onto the skin. Place a new patch every Wednesday and  Sunday  . fenofibrate  160 MG tablet Take 160 mg by mouth daily.  . fish oil-omega-3 fatty acids 1000 MG capsule Take 2 g by mouth daily.  . fluticasone (FLONASE) 50 MCG/ACT nasal spray Place 1 spray into the nose daily as needed.   . gabapentin (NEURONTIN) 100 MG capsule Take 1 pill nightly at bedtime for 1 week, then 2 pills nightly for 1 week, then 3 pills each night thereafter.  . Ibuprofen (MOTRIN PO) Take 200 mg by mouth as needed.  . imiquimod (ALDARA) 5 % cream Apply to affected area three times weekly  . loratadine (CLARITIN) 10 MG tablet TAKE ONE TABLET BY MOUTH EVERY DAY  . losartan-hydrochlorothiazide (HYZAAR) 50-12.5 MG per tablet Take 1 tablet by mouth daily.  . Multiple Vitamin (MULTIVITAMIN) capsule Take 1 capsule by mouth daily.    . Naproxen Sod-Diphenhydramine (ALEVE PM PO) Take 1 tablet by mouth at bedtime as needed and may repeat dose one time if needed.  . niacin (NIASPAN) 1000 MG CR tablet Take 1,000 mg by mouth  at bedtime.  . ergocalciferol (VITAMIN D2) 50000 UNITS capsule Take 50,000 Units by mouth once a week.  . [DISCONTINUED] azelastine (ASTELIN) 137 MCG/SPRAY nasal spray Place 2 sprays into the nose 2 (two) times daily. Use in each nostril as directed  . [DISCONTINUED] Naproxen Sodium (ALEVE PO) Take 1 tablet by mouth every evening.  :  Review of Systems:  Out of a complete 14 point review of systems, all are reviewed and negative with the exception of these symptoms as listed below:  Review of Systems  Constitutional: Negative.   HENT: Negative.   Eyes: Negative.   Respiratory: Positive for cough.   Cardiovascular: Negative.   Gastrointestinal: Negative.   Endocrine: Negative.   Genitourinary: Negative.   Musculoskeletal: Positive for arthralgias.  Skin: Negative.   Allergic/Immunologic: Positive for environmental allergies.  Neurological: Negative.   Hematological: Negative.   Psychiatric/Behavioral: Negative.     Objective:  Neurologic Exam  Physical Exam Physical  Examination:   Filed Vitals:   12/06/13 0831  BP: 127/80  Pulse: 65  Temp: 97.7 F (36.5 C)    General Examination: The patient is a very pleasant 58 y.o. female in no acute distress. She appears well-developed and well-nourished and well groomed. She is mildly anxious.   HEENT: Normocephalic, atraumatic, pupils are equal, round and reactive to light and accommodation. Funduscopic exam is normal with sharp disc margins noted. Extraocular tracking is good without limitation to gaze excursion or nystagmus noted. Normal smooth pursuit is noted. Hearing is grossly intact. Face is symmetric with normal facial animation and normal facial sensation. Speech is clear with no dysarthria noted. There is no hypophonia. There is no lip, neck/head, jaw or voice tremor. Neck is supple with full range of passive and active motion. There are no carotid bruits on auscultation. Oropharynx exam reveals: mild mouth dryness, adequate dental hygiene and mild airway crowding, due to elongated uvula. Mallampati is class I. Tongue protrudes centrally and palate elevates symmetrically. Tonsils are 1+ in size.   Chest: Clear to auscultation without wheezing, rhonchi or crackles noted.  Heart: S1+S2+0, regular and normal without murmurs, rubs or gallops noted.   Abdomen: Soft, non-tender and non-distended with normal bowel sounds appreciated on auscultation.  Extremities: There is no pitting edema in the distal lower extremities bilaterally. Pedal pulses are intact.  Skin: Warm and dry without trophic changes noted. There are no varicose veins.  Musculoskeletal: exam reveals no obvious joint deformities, or joint swelling or erythema. Her shoulder ROM is improved, but slightly limited on the R. she has no obvious joint swelling, redness, rash, or significant joint tenderness.  Neurologically:  Mental status: The patient is awake, alert and oriented in all 4 spheres. Her memory, attention, language and knowledge are  appropriate. There is no aphasia, agnosia, apraxia or anomia. Speech is clear with normal prosody and enunciation. Thought process is linear. Mood is congruent and affect is normal.  Cranial nerves are as described above under HEENT exam. In addition, shoulder shrug is normal with equal shoulder height noted.  Motor exam: Normal bulk, strength and tone is noted. There is no drift, tremor or rebound. Romberg is negative, even though there is a slight degree of sway. Reflexes are 2+ throughout, except absent in the ankles. Toes are downgoing bilaterally. Fine motor skills are intact with normal finger taps, normal hand movements, normal rapid alternating patting, normal foot taps and normal foot agility.  Cerebellar testing shows no dysmetria or intention tremor on finger to nose testing. Heel  to shin is unremarkable bilaterally. There is no truncal or gait ataxia.  Sensory exam is intact to light touch, pinprick, vibration, temperature sense in the upper and lower extremities, with the exception of mild decrease in PP, vibration and temperature sense in the feet up to the mid-forefeet.    Gait, station and balance are unremarkable. No veering to one side is noted. No leaning to one side is noted. Posture is age-appropriate and stance is narrow based. No problems turning are noted. She turns en bloc. Tandem walk is unremarkable. Intact toe and heel stance is noted.               Assessment and Plan:   In summary, [name] is a very pleasant 58 year old female with a history of numbness in both feet, since 5/14, with progressive with stabilization of her numbness and discomfort reported in January but she now complains primarily of joint stiffness and joint pain affecting both elbows, both wrists and both ankles. She does not have any obvious joint swelling, redness, rash, or significant joint tenderness upon examination except for stable decrease in range of motion in her right shoulder. She has an  underlying Hx of HTN, borderline DM, pulmonary sarcoid. Her neurological exam is fairly stable and shows mild evidence of neuropathy and her recent nerve conduction velocity testing also showed mild axonal neuropathy. The etiology is unclear at this time. She is borderline diabetic. She had prior exposure to methotrexate but stopped it in January and her symptoms started in May of last year. I again had a long chat today with the patient, primarily to discuss all her test results. Thus far, her brain MRI with contrast was nonrevealing in particular in the context of a known diagnosis of pulmonary sarcoidosis which is stable and in remission at this time. Her blood work was unremarkable thus far, and we did heavy metal testing last time which was normal. She is advised to continue with the gabapentin as needed for symptomatic treatment of her discomfort. Symptomatic treatment in the form of gabapentin, Lyrica, Cymbalta, amitriptyline and so forth to can alleviate symptoms of painful neuropathy or paresthesias or burning foot pain but does not alter the progression. Since she is reporting more joint related issues I will repeat some of the inflammatory markers. She may benefit from seeing a rheumatologist again. We talked about invasive testing such as spinal fluid testing and/or nerve and muscle biopsy but she is reluctant to pursue this at this time. We may consider this in the future. I will see her back in about 4 months from now, sooner if the need arises. We will be calling her with her test results. She was in agreement.

## 2013-12-07 LAB — SEDIMENTATION RATE: Sed Rate: 13 mm/hr (ref 0–40)

## 2013-12-07 LAB — ANA W/REFLEX: Anti Nuclear Antibody(ANA): NEGATIVE

## 2013-12-07 LAB — LYME, TOTAL AB TEST/REFLEX

## 2013-12-07 LAB — RHEUMATOID FACTOR: Rhuematoid fact SerPl-aCnc: 10.6 IU/mL (ref 0.0–13.9)

## 2013-12-07 LAB — C-REACTIVE PROTEIN: CRP: 3 mg/L (ref 0.0–4.9)

## 2013-12-08 NOTE — Progress Notes (Signed)
Quick Note:  Please advise pt: repeat labs for inflammatory markers and lyme disease are normal.  Star Age, MD, PhD [facility] ([facility]  ______

## 2013-12-11 NOTE — Progress Notes (Signed)
Quick Note:  Informed patient of normal inflammatory markers and lyme disease results, patient expressed understanding, no further questions or concerns. ______

## 2014-01-23 ENCOUNTER — Ambulatory Visit: Payer: 59 | Admitting: Neurology

## 2014-02-12 ENCOUNTER — Ambulatory Visit (INDEPENDENT_AMBULATORY_CARE_PROVIDER_SITE_OTHER)
Admission: RE | Admit: 2014-02-12 | Discharge: 2014-02-12 | Disposition: A | Discharge status: Home / Self Care | Payer: 59 | Source: Ambulatory Visit | Attending: Emergency Medicine | Admitting: Emergency Medicine

## 2014-02-12 DIAGNOSIS — D869 Sarcoidosis, unspecified: Secondary | ICD-10-CM

## 2014-02-27 ENCOUNTER — Ambulatory Visit (INDEPENDENT_AMBULATORY_CARE_PROVIDER_SITE_OTHER): Payer: 59 | Admitting: Emergency Medicine

## 2014-02-27 ENCOUNTER — Encounter: Payer: Self-pay | Admitting: Emergency Medicine

## 2014-02-27 VITALS — BP 122/84 | HR 80 | Ht 65.0 in | Wt 165.2 lb

## 2014-02-27 DIAGNOSIS — D869 Sarcoidosis, unspecified: Secondary | ICD-10-CM

## 2014-02-27 MED ORDER — PREDNISONE 20 MG PO TABS: 40.0000 mg | ORAL_TABLET | Freq: Every day | ORAL | Status: DC

## 2014-02-27 NOTE — Patient Instructions (Addendum)
We will start prednisone for the next month to see if your symptoms improve and your imaging changes.  Depending on your response I believe that you would benefit from more workup that would include lab work and a possible lung biopsy.  Follow with Dr Lamonte Sakai in 1 month

## 2014-02-27 NOTE — Assessment & Plan Note (Addendum)
bx proven sarcoidosis, but now she has peripheral neuropathy, basilar findings on her Ct chest that are not consistent w sarcoid. We discussed the options of more rheum eval, possible lung biopsy, empiric treatment. At this point she would prefer to try empiric prednisone (which she tolerates w difficulty) to see if sx and CT scan improve. Depending on how the CT scan and sx evolve, I will refer her for VATS.

## 2014-02-27 NOTE — Progress Notes (Signed)
[name] is a very pleasant 58 year old woman with a history of stable sarcoidosis. Last seen 11/08. She is not on maintanance meds for sarcoidosis. Since last visit has had sinus surgery for sarcoid involvement [name]). Post-op course comp by MRSA, treated for eradication. Tricor started in 8/09. Seen at 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   [name] 06/16/10 -- 58 yo woman, sarcoidosis. Has had recurrence of her dry cough, a tickle in throat. Frequent throat clearing. Has allergy symptoms, clear congestion. Does NSW occasionally. Not on an allergy regimen. PFT's done today, FEV1 and FVC down slightly from 2007.   [name] 07/23/10 -- Sarcoidosis, allergies/chronic cough, sinus disease (from sarcoid). Stopped lisinopril last time (2 mo ago). Tessalon Perles called in but she hasn't picked up. Continues to cough, has been doing so for a year!. She started nasal saline washes and fluticasone spray.   [name] 08/21/10 -- returns for sarcoidosis and cough. last time I treated her with prednisone for her progressive symptoms and cavitary changes on CT scan of the chest. She began to feel better on th epred - more energy, a bit less cough. She did have insomnia w the pred. Since 12/24 has had URI symptoms, lost her voice. Some relief from alka-seltzer plus, mucinex. Also on fluticasone spray and NSW two times a day.   [name] 09/16/09 -- sarcoidosis, abnormal CT scan. repeated 12/30. She has had persistent cough since last time, still w hoarse voice, globus sensation. Doing NSW's. Greenish/yellow stuff nasal drainage. Breathing is stable.   [name] 11/03/10 -- sarcoidosis, changes on CT scan. Rx for suspected chronic sinusitis w clinda 4 wks, mucous now clear, still drains. We also treated her with another round prednisone for more inflammation around UL cavity, now off. She is exercising more, cough is better.   [name] 04/14/11 -- sarcoidosis, changes on CT scan . She has had a return of her purulent nasal  drainage - went back on residual clinda that she had available. The mucous is starting to clear. She nasal saline washes daily. Takes loratadine and flonase. Her cough is more bothersome - more SOB. Denies rash.   [name] 05/08/11 -- sarcoidosis, abnormal CT scan. We repeated scan on 8/22 - shows active RML disease, apical B bullae, no nodules. She continues to feel fatigued, have dry cough. We discussed starting methotrexate given the progressive changes on Ct scan of the chest   [name] 05/26/11 -- sarcoidosis, abnormal CT scan as above. Started Pred 40mg  qd last visit.   [name] 06/24/11 -- sarcoidosis with cavitary dz on CT scan as below. Last time we decreased pred to 20mg  qd - has had more cough and drainage since that change. We discussed possibly starting MTX last time - LFT and hepatitis panel were normal last time. She wants to wait until after the holidays to start the MTX.   [name] 09/15/11 -- sarcoidosis with cavitary dz on CT scan. On chronic pred, and have been discussing possible change to MTX. She began to have sinus drainage, green,  and cough about 3 weeks ago. She has has some HA, no fevers. She is on loratadine. Had to use albuterol a few times since last time.   [name] 09/25/11 -- sarcoidosis with cavitary dz on CT scan. On chronic pred, and have been discussing possible change to MTX. Deferred last OV due to possible acute sinusitis, rx w augmentin. She is on [name], loratadine, nasal steroid. The drainage has cleared, no longer green.   [name] 10/30/11 -- sarcoidosis with  cavitary dz on CT scan. On chronic pred, last time we started MTX 7.5mg  qweek with folate qd. LFT's and CBC's have been stable/normal. Returns today for f/u. She has had a little nausea, sluggishness. Still has a dry cough, nasal gtt. NSW 2x a day, loratadine. She has occasional GERD sx. She has some chest tightness, doesn't really use SABA.  >>  Acute OV 11/19/11 Complains of pt c/o yellow prod cough and chest tightness and achy chest x  1week worsening. Pt has used OTC Mucinex with no relief. Patient complains of sinus pain, and pressure. Over-the-counter medications are not helping at all. Cough is keeping her up at night. She is currently on methotrexate and Prednisone 20mg  daily for her sarcoidosis. She denies any hemoptysis, nausea, vomiting, diarrhea, edema, or rash.  [name] 12/03/11 -- sarcoidosis with cavitary dz on CT scan. On chronic pred, on MTX 7.5mg  qweek with folate qd since 09/25/11. Was seen 3/28 as above for productive cough, ? Sinusitis/Bronchitis, treated with with augmentin. Drainage from her head and cough are better. LFT and CBC have been normal on the MTX. She held her MTX last week of March while she was sick, then started back. She feels that the MTX is making her more lethargic, feels "heavy".   [name] 02/04/12 -- sarcoidosis with cavitary dz on CT scan. On chronic pred, on MTX qweek with folate qd since 09/25/11. Since last time treated her for sinusitis w abx, now better. Last time we decreased the pred to 10mg .   [name] 04/14/12 -- sarcoidosis with cavitary dz on CT scan. On chronic pred, on MTX 12.5 mg qweek with folate qd since 09/25/11. She has been having more sinus drainage, green phlegm, no blood, + HA. She is having associated cough.   [name] 05/23/12 -- sarcoidosis with cavitary dz on CT scan. On chronic pred, on MTX 12.5 mg qweek with folate qd since 09/25/11. Last time we started Augmentin +  NSW for sinus dz. Her sx were better whiloe she was on the augmentin, then worsened when off  [name] 07/07/12 -- sarcoidosis with cavitary dz on CT scan. She is on MTX 12.5mg  + folate, off pred since Summer 2013. She was treated for chronic sinusitis, about to finish 6 weeks. She is still having throat clearing, dry cough, chest tightness. She has nasonex, has not been using astelin. She has never benefited in the past from PPI or SABA.   [name] 07/27/12 -- sarcoidosis with cavitary dz on CT scan. She is on MTX 12.5mg  + folate, off pred  since Summer 2013. Last time checked CT scan sinuses and chest. The chest Ct scan is stable with some resolution of inflammatory changes. CT sinuses with stable chronic changes, chronic paranasal sinusitis. She continues to have globus sensation, dry cough. She has had some nose bleeding w astelin   [name] 08/04/13 -- sarcoidosis with cavitary dz on CT scan. She is on MTX 12.5mg  + folate, off pred since Summer 2013. We decreased MTX last time to 10mg . I haven't seen her for a year. She has been dealing with other problems, seeing Neuro for peripheral neuropathy, not felt to relate to sarcoidosis. She went ahead and decreased the MTX down to zero - she has been off since March. Her breathing has been stable, she still has allergies sx and drainage, cough.   [name] 08/11/13 -- sarcoidosis with cavitary dz on CT scan, now off MTX and pred. We repeated CT scan on 12/18 > no significant changes compared with 06/2012.  She is feeling well, stable.   [name] 02/27/14 -- 58 yo woman that we have followed for sarcoidosis with sinus and cavitary pulm manifestations. She came off MTX in 10/2012. Repeat Ct scan done and is overall stable but quite abnormal > particularly at the B bases where there is honeycomb change and probable GG as well. She has seen neuro and rheum [name]) for a peripheral neuropathy in her feet moving more proximally, associated with ankle pain and now shoulder pain.    EXAM :  Filed Vitals:   02/27/14 0910  BP: 122/84  Pulse: 80  Height: 5\' 5"  (1.651 m)  Weight: 165 lb 3.2 oz (74.934 kg)  SpO2: 96%   Gen: Pleasant, well-nourished, in no distress,  normal affect  ENT: No lesions,  mouth clear,  oropharynx clear, no postnasal drip, max sinus tenderness   Neck: No JVD, no TMG, no carotid bruits  Lungs: No use of accessory muscles, no dullness to percussion, coarse BS with no wheezing   Cardiovascular: RRR, heart sounds normal, no murmur or gallops, no peripheral edema  Musculoskeletal: No  deformities, no cyanosis or clubbing  Neuro: alert, non focal  Skin: Warm, no lesions or rashes    CT scan Chest 02/12/14 --  COMPARISON: Chest CT 08/09/2013.  FINDINGS:  Mediastinum: Numerous enlarged mediastinal and bilateral hilar lymph  nodes measuring up to 2.3 cm in short axis in the subcarinal station  and 16 mm in short axis in the right paratracheal stations, similar  to prior examinations. Many of these are coarsely calcified,  compatible with clinical history of sarcoidosis. Heart size is  normal. There is no significant pericardial fluid, thickening or  pericardial calcification. There is atherosclerosis of the thoracic  aorta, the great vessels of the mediastinum and the coronary  arteries, including calcified atherosclerotic plaque in the left  anterior descending coronary arteries. Esophagus is unremarkable in  appearance.  Lungs/Pleura: Again noted is a lower lung predominant pattern of  subpleural and peribronchovascular reticulation with extensive  honeycombing, which has progressively increased in severity compared  to prior studies 06/19/2010, highly concerning for progressively  worsening usual interstitial pneumonia (UIP). Additionally, there  are areas of nodularity along the fissures bilaterally, as well as  marked thickening of the peribronchovascular interstitium scattered  throughout portions of the upper lobes of the lungs bilaterally;  imaging findings that are typical of sarcoidosis. These appear  similar to the prior examinations, and there is some associated  cylindrical and varicose bronchiectasis. Focal area of architectural  distortion with some internal cavitation in the medial aspect of the  left upper lobe is unchanged, as is an area of fibrocavitary changes  in the posterior aspect of the right upper lobe. Inspiratory and  expiratory imaging demonstrates some mosaic attenuation in the lung  parenchyma bilaterally, suggesting some air trapping  from small  airways disease. No pleural effusions.  Upper Abdomen: Unremarkable.  Musculoskeletal: Enlarged left axillary lymph node measuring 1.5 cm  in short axis, similar to the prior study. There are no aggressive  appearing lytic or blastic lesions noted in the visualized portions  of the skeleton.  IMPRESSION:  1. Stigmata of sarcoidosis including mediastinal and bilateral hilar  lymphadenopathy as well as a perilymphatic pattern of disease  throughout predominantly the mid to upper lungs bilaterally, similar  to prior studies.  2. Progressively worsening changes of interstitial lung disease that  are most prevalent in the lung bases bilaterally, including severe  honeycombing. These have markedly progressed  over the past 4 year  time interval, and the appearance of this is strongly suggestive of  the usual interstitial pneumonia (UIP). This appearance and  distribution would be unusual as a manifestation of sarcoidosis.  3. Atherosclerosis, including left anterior descending coronary  artery disease. Please note that although the presence of coronary  artery calcium documents the presence of coronary artery disease,  the severity of this disease and any potential stenosis cannot be  assessed on this non-gated CT examination. Assessment for potential  risk factor modification, dietary therapy or pharmacologic therapy  may be warranted, if clinically indicated .   CT sinuses 07/13/12 --  Comparison: 04/15/2011.  Findings: Negative visualized noncontrast brain parenchyma.  Grossly normal orbit and face soft tissues.  Visualized tympanic cavities are clear.  Visualized sphenoid sinuses are clear.  Previous maxillary antrostomies, turbinatectomies, and  ethmoidectomies. Stable mucoperiosteal thickening throughout the  maxillary and residual ethmoid sinuses.  Frontal sinuses left well visualized today but appear clear.  No acute osseous abnormality identified.  IMPRESSION:   Extensive changes of paranasal sinus surgery again noted with  chronic paranasal sinusitis, no significant change since 2012 and  no new areas of involvement.    SARCOIDOSIS bx proven sarcoidosis, but now she has peripheral neuropathy, basilar findings on her Ct chest that are not consistent w sarcoid. We discussed the options of more rheum eval, possible lung biopsy, empiric treatment. At this point she would prefer to try empiric prednisone (which she tolerates w difficulty) to see if sx and CT scan improve. Depending on how the CT scan and sx evolve, I will refer her for VATS.

## 2014-03-28 ENCOUNTER — Other Ambulatory Visit: Payer: Self-pay

## 2014-03-28 DIAGNOSIS — G609 Hereditary and idiopathic neuropathy, unspecified: Secondary | ICD-10-CM

## 2014-03-28 MED ORDER — GABAPENTIN 100 MG PO CAPS: 300.0000 mg | ORAL_CAPSULE | Freq: Every evening | ORAL | Status: DC

## 2014-03-29 ENCOUNTER — Other Ambulatory Visit: Payer: Self-pay | Admitting: Pulmonary Disease

## 2014-03-29 ENCOUNTER — Other Ambulatory Visit: Payer: Self-pay | Admitting: Emergency Medicine

## 2014-03-29 DIAGNOSIS — D869 Sarcoidosis, unspecified: Secondary | ICD-10-CM

## 2014-03-29 MED ORDER — FLUTICASONE PROPIONATE 50 MCG/ACT NA SUSP: 1.0000 | Freq: Every day | NASAL | Status: DC | PRN

## 2014-03-29 MED ORDER — PREDNISONE 20 MG PO TABS: 40.0000 mg | ORAL_TABLET | Freq: Every day | ORAL | Status: DC

## 2014-04-13 ENCOUNTER — Ambulatory Visit (INDEPENDENT_AMBULATORY_CARE_PROVIDER_SITE_OTHER)
Admission: RE | Admit: 2014-04-13 | Discharge: 2014-04-13 | Disposition: A | Discharge status: Home / Self Care | Payer: 59 | Source: Ambulatory Visit | Attending: Emergency Medicine | Admitting: Emergency Medicine

## 2014-04-13 DIAGNOSIS — D869 Sarcoidosis, unspecified: Secondary | ICD-10-CM

## 2014-04-18 ENCOUNTER — Ambulatory Visit (INDEPENDENT_AMBULATORY_CARE_PROVIDER_SITE_OTHER): Payer: 59 | Admitting: Emergency Medicine

## 2014-04-18 ENCOUNTER — Encounter (INDEPENDENT_AMBULATORY_CARE_PROVIDER_SITE_OTHER): Payer: Self-pay

## 2014-04-18 ENCOUNTER — Encounter: Payer: Self-pay | Admitting: Emergency Medicine

## 2014-04-18 VITALS — BP 134/88 | HR 76 | Ht 65.0 in | Wt 178.0 lb

## 2014-04-18 DIAGNOSIS — D869 Sarcoidosis, unspecified: Secondary | ICD-10-CM

## 2014-04-18 MED ORDER — FUROSEMIDE 20 MG PO TABS: 20.0000 mg | ORAL_TABLET | Freq: Every day | ORAL | Status: DC

## 2014-04-18 NOTE — Progress Notes (Signed)
[name] is a very pleasant 58 year old woman with a history of stable sarcoidosis. Last seen 11/08. She is not on maintanance meds for sarcoidosis. Since last visit has had sinus surgery for sarcoid involvement [name]). Post-op course comp by MRSA, treated for eradication. Tricor started in 8/09. Seen at 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   [name] 06/16/10 -- 58 yo woman, sarcoidosis. Has had recurrence of her dry cough, a tickle in throat. Frequent throat clearing. Has allergy symptoms, clear congestion. Does NSW occasionally. Not on an allergy regimen. PFT's done today, FEV1 and FVC down slightly from 2007.   [name] 07/23/10 -- Sarcoidosis, allergies/chronic cough, sinus disease (from sarcoid). Stopped lisinopril last time (2 mo ago). Tessalon Perles called in but she hasn't picked up. Continues to cough, has been doing so for a year!. She started nasal saline washes and fluticasone spray.   [name] 08/21/10 -- returns for sarcoidosis and cough. last time I treated her with prednisone for her progressive symptoms and cavitary changes on CT scan of the chest. She began to feel better on th epred - more energy, a bit less cough. She did have insomnia w the pred. Since 12/24 has had URI symptoms, lost her voice. Some relief from alka-seltzer plus, mucinex. Also on fluticasone spray and NSW two times a day.   [name] 09/16/09 -- sarcoidosis, abnormal CT scan. repeated 12/30. She has had persistent cough since last time, still w hoarse voice, globus sensation. Doing NSW's. Greenish/yellow stuff nasal drainage. Breathing is stable.   [name] 11/03/10 -- sarcoidosis, changes on CT scan. Rx for suspected chronic sinusitis w clinda 4 wks, mucous now clear, still drains. We also treated her with another round prednisone for more inflammation around UL cavity, now off. She is exercising more, cough is better.   [name] 04/14/11 -- sarcoidosis, changes on CT scan . She has had a return of her purulent nasal  drainage - went back on residual clinda that she had available. The mucous is starting to clear. She nasal saline washes daily. Takes loratadine and flonase. Her cough is more bothersome - more SOB. Denies rash.   [name] 05/08/11 -- sarcoidosis, abnormal CT scan. We repeated scan on 8/22 - shows active RML disease, apical B bullae, no nodules. She continues to feel fatigued, have dry cough. We discussed starting methotrexate given the progressive changes on Ct scan of the chest   [name] 05/26/11 -- sarcoidosis, abnormal CT scan as above. Started Pred 40mg  qd last visit.   [name] 06/24/11 -- sarcoidosis with cavitary dz on CT scan as below. Last time we decreased pred to 20mg  qd - has had more cough and drainage since that change. We discussed possibly starting MTX last time - LFT and hepatitis panel were normal last time. She wants to wait until after the holidays to start the MTX.   [name] 09/15/11 -- sarcoidosis with cavitary dz on CT scan. On chronic pred, and have been discussing possible change to MTX. She began to have sinus drainage, green,  and cough about 3 weeks ago. She has has some HA, no fevers. She is on loratadine. Had to use albuterol a few times since last time.   [name] 09/25/11 -- sarcoidosis with cavitary dz on CT scan. On chronic pred, and have been discussing possible change to MTX. Deferred last OV due to possible acute sinusitis, rx w augmentin. She is on [name], loratadine, nasal steroid. The drainage has cleared, no longer green.   [name] 10/30/11 -- sarcoidosis with  cavitary dz on CT scan. On chronic pred, last time we started MTX 7.5mg  qweek with folate qd. LFT's and CBC's have been stable/normal. Returns today for f/u. She has had a little nausea, sluggishness. Still has a dry cough, nasal gtt. NSW 2x a day, loratadine. She has occasional GERD sx. She has some chest tightness, doesn't really use SABA.  >>  Acute OV 11/19/11 Complains of pt c/o yellow prod cough and chest tightness and achy chest x  1week worsening. Pt has used OTC Mucinex with no relief. Patient complains of sinus pain, and pressure. Over-the-counter medications are not helping at all. Cough is keeping her up at night. She is currently on methotrexate and Prednisone 20mg  daily for her sarcoidosis. She denies any hemoptysis, nausea, vomiting, diarrhea, edema, or rash.  [name] 12/03/11 -- sarcoidosis with cavitary dz on CT scan. On chronic pred, on MTX 7.5mg  qweek with folate qd since 09/25/11. Was seen 3/28 as above for productive cough, ? Sinusitis/Bronchitis, treated with with augmentin. Drainage from her head and cough are better. LFT and CBC have been normal on the MTX. She held her MTX last week of March while she was sick, then started back. She feels that the MTX is making her more lethargic, feels "heavy".   [name] 02/04/12 -- sarcoidosis with cavitary dz on CT scan. On chronic pred, on MTX qweek with folate qd since 09/25/11. Since last time treated her for sinusitis w abx, now better. Last time we decreased the pred to 10mg .   [name] 04/14/12 -- sarcoidosis with cavitary dz on CT scan. On chronic pred, on MTX 12.5 mg qweek with folate qd since 09/25/11. She has been having more sinus drainage, green phlegm, no blood, + HA. She is having associated cough.   [name] 05/23/12 -- sarcoidosis with cavitary dz on CT scan. On chronic pred, on MTX 12.5 mg qweek with folate qd since 09/25/11. Last time we started Augmentin +  NSW for sinus dz. Her sx were better whiloe she was on the augmentin, then worsened when off  [name] 07/07/12 -- sarcoidosis with cavitary dz on CT scan. She is on MTX 12.5mg  + folate, off pred since Summer 2013. She was treated for chronic sinusitis, about to finish 6 weeks. She is still having throat clearing, dry cough, chest tightness. She has nasonex, has not been using astelin. She has never benefited in the past from PPI or SABA.   [name] 07/27/12 -- sarcoidosis with cavitary dz on CT scan. She is on MTX 12.5mg  + folate, off pred  since Summer 2013. Last time checked CT scan sinuses and chest. The chest Ct scan is stable with some resolution of inflammatory changes. CT sinuses with stable chronic changes, chronic paranasal sinusitis. She continues to have globus sensation, dry cough. She has had some nose bleeding w astelin   [name] 08/04/13 -- sarcoidosis with cavitary dz on CT scan. She is on MTX 12.5mg  + folate, off pred since Summer 2013. We decreased MTX last time to 10mg . I haven't seen her for a year. She has been dealing with other problems, seeing Neuro for peripheral neuropathy, not felt to relate to sarcoidosis. She went ahead and decreased the MTX down to zero - she has been off since March. Her breathing has been stable, she still has allergies sx and drainage, cough.   [name] 08/11/13 -- sarcoidosis with cavitary dz on CT scan, now off MTX and pred. We repeated CT scan on 12/18 > no significant changes compared with 06/2012.  She is feeling well, stable.   [name] 02/27/14 -- 58 yo woman that we have followed for sarcoidosis with sinus and cavitary pulm manifestations. She came off MTX in 10/2012. Repeat Ct scan done and is overall stable but quite abnormal > particularly at the B bases where there is honeycomb change and probable GG as well. She has seen neuro and rheum [name]) for a peripheral neuropathy in her feet moving more proximally, associated with ankle pain and now shoulder pain.   [name] 04/18/14 -- follow up visit for stage 2-3 sarcoidosis, cavitary lesions, sinus disease.  We just did an empiric month of prednisone. Her breathing did not change - she did have side effects from the pred.  She doesn't believe the pred changed her neuropathy. She feels weaker now.     EXAM :  Filed Vitals:   04/18/14 0907  BP: 134/88  Pulse: 76  Height: 5\' 5"  (1.651 m)  Weight: 178 lb (80.74 kg)  SpO2: 99%   Gen: Pleasant, well-nourished, in no distress,  normal affect  ENT: No lesions,  mouth clear,  oropharynx clear, no  postnasal drip, max sinus tenderness   Neck: No JVD, no TMG, no carotid bruits  Lungs: No use of accessory muscles, no dullness to percussion, coarse BS with no wheezing   Cardiovascular: RRR, heart sounds normal, no murmur or gallops, no peripheral edema  Musculoskeletal: No deformities, no cyanosis or clubbing  Neuro: alert, non focal  Skin: Warm, no lesions or rashes    CT scan Chest 04/13/14 --  COMPARISON: 02/12/2014.  FINDINGS:  Calcified and noncalcified mediastinal lymph nodes measure up to 1.6  cm in the lower right paratracheal station, as before. Axillary  lymph nodes measure up to 1.8 cm on the left, stable. Coronary  artery calcification. Heart is at the upper limits of normal in  size. No pericardial effusion.  Upper lobe predominant cavitation, bronchiectasis, ground-glass and  volume loss are unchanged. Small cavity with internal debris versus  focal bronchiectasis and volume loss (image 6, series 5). Basilar  predominant bronchiectasis and honeycombing are again noted. 7 mm  nodule along the left major fissure is unchanged. No pleural fluid.  Airway is otherwise unremarkable. No air trapping.  Incidental imaging of the upper abdomen shows the visualized  portions of the liver and adrenal glands to be grossly unremarkable.  1.3 cm low-attenuation lesion in the upper pole right kidney is  unchanged. Visualized portions of the kidneys and spleen are  otherwise unremarkable. Calcifications are seen in the pancreas. No  worrisome lytic or sclerotic lesions.  IMPRESSION:  1. Mediastinal/hilar and pulmonary parenchymal changes of sarcoid,  stable.  2. Probable focal bronchiectasis and volume loss in the apical  portion of the left upper lobe. Difficult to exclude a small  mycetoma.  3. Bibasilar fibrosis, as before, indicative of usual interstitial  pneumonitis (UIP).  4. Coronary artery calcification.  .  CT sinuses 07/13/12 --  Comparison: 04/15/2011.   Findings: Negative visualized noncontrast brain parenchyma.  Grossly normal orbit and face soft tissues.  Visualized tympanic cavities are clear.  Visualized sphenoid sinuses are clear.  Previous maxillary antrostomies, turbinatectomies, and  ethmoidectomies. Stable mucoperiosteal thickening throughout the  maxillary and residual ethmoid sinuses.  Frontal sinuses left well visualized today but appear clear.  No acute osseous abnormality identified.  IMPRESSION:  Extensive changes of paranasal sinus surgery again noted with  chronic paranasal sinusitis, no significant change since 2012 and  no new areas of involvement.  SARCOIDOSIS She is evolving stage 4 disease with interstial scar. Her CT scan did not change compared with 6/'15 after pred. At this point I don't see any GGI that would be high yield for repeat bx. For now I would like to follow her, repeat imaging.

## 2014-04-18 NOTE — Patient Instructions (Signed)
Your CT scan is unchanged compared with 01/2014.  Take lasix 20mg  daily for 3 days Continue your other usual medications.  We will repeat your CXR in 6 months and then your CT scan in 12 months. We will perform sooner if you develop new symptoms.  Follow with Dr Lamonte Sakai in 6 months with a CXR or sooner if you have any problems

## 2014-04-18 NOTE — Assessment & Plan Note (Signed)
She is evolving stage 4 disease with interstial scar. Her CT scan did not change compared with 6/'15 after pred. At this point I don't see any GGI that would be high yield for repeat bx. For now I would like to follow her, repeat imaging.

## 2014-04-23 ENCOUNTER — Encounter: Payer: Self-pay | Admitting: Neurology

## 2014-04-23 ENCOUNTER — Ambulatory Visit (INDEPENDENT_AMBULATORY_CARE_PROVIDER_SITE_OTHER): Payer: 59 | Admitting: Neurology

## 2014-04-23 VITALS — BP 123/79 | HR 78 | Ht 65.0 in | Wt 178.0 lb

## 2014-04-23 DIAGNOSIS — T50905A Adverse effect of unspecified drugs, medicaments and biological substances, initial encounter: Secondary | ICD-10-CM

## 2014-04-23 DIAGNOSIS — G6289 Other specified polyneuropathies: Secondary | ICD-10-CM

## 2014-04-23 DIAGNOSIS — D869 Sarcoidosis, unspecified: Secondary | ICD-10-CM

## 2014-04-23 DIAGNOSIS — R635 Abnormal weight gain: Secondary | ICD-10-CM

## 2014-04-23 DIAGNOSIS — R638 Other symptoms and signs concerning food and fluid intake: Secondary | ICD-10-CM

## 2014-04-23 DIAGNOSIS — R209 Unspecified disturbances of skin sensation: Secondary | ICD-10-CM

## 2014-04-23 DIAGNOSIS — R202 Paresthesia of skin: Secondary | ICD-10-CM

## 2014-04-23 DIAGNOSIS — G589 Mononeuropathy, unspecified: Secondary | ICD-10-CM

## 2014-04-23 NOTE — Progress Notes (Signed)
Subjective:    Patient ID: [name] is a 58 y.o. female.  HPI    Interim history:   [name] is a 58 year old right-handed woman with an underlying medical history of vitamin D deficiency, insomnia, hypertension, hyperlipidemia, allergic rhinitis, sarcoidosis of the lung, and recent diagnosis of diabetes, who presents for followup consultation of her bilateral foot numbness, starting in 5/14 with discomfort in her feet, worse at night. She is unaccompanied today. I last saw her on 12/06/2013, at which time she reported more issues with pain, rather than paresthesias or numbness, and the pain was affecting her elbows, wrists and ankles, R a little more than left with no obvious redness, swelling or crepitations, but some popping noted and stiffness particularly in the mornings. She had taken gabapentin occasionally, which helped some, but primarily made her sleepy. She had not been back to see Dr. Amil Amen. I suggested she continue gabapentin as needed. I suggested additional blood work. I advised her to see her rheumatologist again. Blood work from 12/06/2013 included ANA with reflex, CRP, rheumatoid factor, ESR and Lyme disease antibodies, all of which were unremarkable and the call her with the test results.  Today, she reports that gradually, she has become worse, with respect to heaviness felt in her legs, particulary, now also affecting upper legs. Her tingling is also worse, particularly at night. She has not fallen, and has no actual pain. She was recently treated with oral steroids for about 6 weeks and stopped it about 10 days ago or so. This was for her sarcoid per Dr. Lamonte Sakai. She no longer is on gabapentin, as she did not feel well on it.   I saw her on 09/07/2013, at which time she reported recent stabilization of her numbness and discomfort in both feet. Her exam showed evidence of mild neuropathy and recent nerve conduction velocity testing showed mild axonal neuropathy with unclear  etiology at the time. She is borderline diabetic. She has a history of prior exposure to methotrexate. She was advised to use gabapentin for symptomatic treatment of her discomfort. We did a heavy metal testing in her blood which was normal.  I first met her on 05/04/2013, at which time I suggested further workup in the form of blood work and EMG/NCV testing. I also suggested lumbar spine MRI and brain MRI with and without contrast based on her history of sarcoidosis and recurrent headaches reported. Her brain MRI with and without contrast from 05/31/2013 showed: Equivocal MRI brain (with and without) demonstrating: Few punctate foci of non-specific gliosis. No acute findings. Her lumbar spine MRI without contrast from 05/31/2013 was reported as normal. I also referred her to rheumatology d/t R shoulder pain. She had a R shoulder MRI on 08/11/13: Mild rotator cuff tendinosis with small focus of partial bursal surface insertional tearing of the infraspinous tendon. No full-thickness tendon tear, tendon retraction or muscular atrophy. 2. Mild subacromial -subdeltoid bursitis. 3. Mild thickening and edema of the joint capsule suggesting possible adhesive capsulitis. 4. No evidence of labral or biceps tendon tear. She saw Dr. Amil Amen and had b/l shoulder steroid injection which helped.  She had a chest CT wo contrast on 08/09/13: Similar appearance of left axillary, mediastinal and hilar adenopathy compatible with the clinical history of sarcoid. Interstitial lung disease compatible with usual interstitial pneumonitis.  She had an EMG and nerve conduction study on 05/17/2013: Abnormal study demonstrating: 1. Bilateral medial and lateral plantar nerve responses are abnormal, and may reflect mild distal, axonal neuropathy.  2. No electrodiagnostic evidence of lumbar radiculopathy at this time on basis of normal needle EMG, but cannot be totally excluded on clinical basis.  For symptomatic treatment I called in  gabapentin in December 2014, after she called back reporting that her symptoms in her feet were worse but she did not fill the prescription as she read the side effects and wanted to understand how it would work for her.  Her blood work, including aldolase, CK, ACE level, B12, IFE and RPR were unremarkable. Previously with her primary care physician she had ANA, ESR, CRP, all of which were unremarkable, hemoglobin A1c was 6.2 in July 2014.  Her symptoms started in May 2014 with numbness in the L digit 5 of her foot and has progressed. The numbness is not complete and feels in the inside but has progressed to the ball of the L foot and on the R she has involvement of the entire big toe. She has in the past couple of weeks had some heel pain bilaterally. She has a longstanding Hx of mild LBP, non-radiating. She has been off of MTX since January 2014. She has had recurrent HAs, mostly sinus related, but has noted memory loss as well for the past years. She denied RLS Sx. She takes an OTC sleep aid for sleep.   Her Past Medical History Is Significant For: Past Medical History  Diagnosis Date  . Cough   . Sarcoidosis   . Hypertension   . Diabetes mellitus without complication   . Chronic sinusitis     Her Past Surgical History Is Significant For: Past Surgical History  Procedure Laterality Date  . Nose surgery    . Tubal ligation    . Vesicovaginal fistula closure w/ tah    . Lymphadenectomy      Her Family History Is Significant For: Family History  Problem Relation Age of Onset  . Stroke Father   . Heart murmur Father   . Diabetes Father   . Emphysema Mother   . COPD Mother   . Breast cancer Sister     Her Social History Is Significant For: History   Social History  . Marital Status: Divorced    Spouse Name: N/A    Number of Children: 2  . Years of Education: college   Occupational History  . [place] History Main Topics  . Smoking status:  Never Smoker   . Smokeless tobacco: Never Used  . Alcohol Use: No  . Drug Use: None  . Sexual Activity: Yes    Partners: Female   Other Topics Concern  . None   Social History Narrative  . None    Her Allergies Are:  No Known Allergies:   Her Current Medications Are:  Outpatient Encounter Prescriptions as of 04/23/2014  Medication Sig  . aspirin 81 MG tablet Take 81 mg by mouth daily.  Marland Kitchen aspirin-acetaminophen-caffeine (EXCEDRIN MIGRAINE) 250-250-65 MG per tablet Take 1 tablet by mouth every 6 (six) hours as needed for pain.  . Cholecalciferol (VITAMIN D-3) 5000 UNITS TABS Take 5,000 Units by mouth daily.  . ergocalciferol (VITAMIN D2) 50000 UNITS capsule Take 50,000 Units by mouth once a week.  . fenofibrate 160 MG tablet Take 160 mg by mouth daily.  . fish oil-omega-3 fatty acids 1000 MG capsule Take 2 g by mouth daily.  . fluticasone (FLONASE) 50 MCG/ACT nasal spray Place 1 spray into both nostrils daily as needed.  . gabapentin (NEURONTIN) 100 MG capsule Take  300 mg by mouth daily as needed.  . Ibuprofen (MOTRIN PO) Take 200 mg by mouth as needed.  . imiquimod (ALDARA) 5 % cream Apply to affected area three times weekly  . loratadine (CLARITIN) 10 MG tablet TAKE ONE TABLET BY MOUTH EVERY DAY  . losartan-hydrochlorothiazide (HYZAAR) 50-12.5 MG per tablet Take 1 tablet by mouth daily.  . Multiple Vitamin (MULTIVITAMIN) capsule Take 1 capsule by mouth daily.    . Naproxen Sod-Diphenhydramine (ALEVE PM PO) Take 1 tablet by mouth at bedtime as needed and may repeat dose one time if needed.  . niacin (NIASPAN) 1000 MG CR tablet Take 1,000 mg by mouth at bedtime.  . [DISCONTINUED] furosemide (LASIX) 20 MG tablet Take 1 tablet (20 mg total) by mouth daily.  :  Review of Systems:  Out of a complete 14 point review of systems, all are reviewed and negative with the exception of these symptoms as listed below:   Review of Systems  Musculoskeletal:       Weakness in both legs.     Objective:  Neurologic Exam  Physical Exam Physical Examination:   Filed Vitals:   04/23/14 1135  BP: 123/79  Pulse: 78    General Examination: The patient is a very pleasant 58 y.o. female in no acute distress. She appears well-developed and well-nourished and well groomed. She is mildly anxious.   HEENT: Normocephalic, atraumatic, pupils are equal, round and reactive to light and accommodation. Funduscopic exam is normal with sharp disc margins noted. Extraocular tracking is good without limitation to gaze excursion or nystagmus noted. Normal smooth pursuit is noted. Hearing is grossly intact. Face is symmetric with normal facial animation and normal facial sensation. Speech is clear with no dysarthria noted. There is no hypophonia. There is no lip, neck/head, jaw or voice tremor. Neck is supple with full range of passive and active motion. There are no carotid bruits on auscultation. Oropharynx exam reveals: mild mouth dryness, adequate dental hygiene and mild airway crowding, due to elongated uvula. Mallampati is class I. Tongue protrudes centrally and palate elevates symmetrically. Tonsils are 1+ in size.   Chest: Clear to auscultation without wheezing, rhonchi or crackles noted.  Heart: S1+S2+0, regular and normal without murmurs, rubs or gallops noted.   Abdomen: Soft, non-tender and non-distended with normal bowel sounds appreciated on auscultation.  Extremities: There is no pitting edema in the distal lower extremities bilaterally. Pedal pulses are intact.  Skin: Warm and dry without trophic changes noted. There are no varicose veins.  Musculoskeletal: exam reveals no obvious joint deformities, or joint swelling or erythema. Her shoulder ROM is improved, but slightly limited on the R. she has no obvious joint swelling, redness, rash, or significant joint tenderness.  Neurologically:  Mental status: The patient is awake, alert and oriented in all 4 spheres. Her memory,  attention, language and knowledge are appropriate. There is no aphasia, agnosia, apraxia or anomia. Speech is clear with normal prosody and enunciation. Thought process is linear. Mood is congruent and affect is normal.  Cranial nerves are as described above under HEENT exam. In addition, shoulder shrug is normal with equal shoulder height noted.  Motor exam: Normal bulk, strength and tone is noted. There is no drift, tremor or rebound. Romberg is negative, even though there is a slight degree of sway. Reflexes are 1+ in the UEs and trace in her knees and absent in the ankles. Toes are downgoing bilaterally. Fine motor skills are intact with normal finger taps, normal hand  movements, normal rapid alternating patting, normal foot taps and normal foot agility.  Cerebellar testing shows no dysmetria or intention tremor on finger to nose testing. Heel to shin is unremarkable bilaterally. There is no truncal or gait ataxia.  Sensory exam is intact to light touch, pinprick, vibration, temperature sense in the upper extremities, and in the LEs, she has decrease in PP, vibration and temperature sense up to one hand-breadth above both ankles. .    Gait, station and balance are unremarkable. No veering to one side is noted. No leaning to one side is noted. Posture is age-appropriate and stance is narrow based. No problems turning are noted. She turns en bloc. Tandem walk is slightly difficult in the beginning. Intact toe and heel stance is noted.               Assessment and Plan:   In summary, Kareem Wirthlin is a very pleasant 58 year old female with a history of numbness in both feet, since 5/14, with progressive symptoms and she feels that she has now noticed heaviness in her legs and also tingling that is more in the upper legs not just confined to the lower legs. On examination she does have mild changes from last time including decrease in reflexes in the lower extremities and progression of the decreased  sensation to above her ankles as opposed to the lower ankles. Her discomfort is about the same. She did not like the way Neurontin made her feel so she stopped it. Her workup thus far has shown evidence of axonal neuropathy. The EMG nerve conduction study was done last year. We did blood work, which was unrevealing. We again talked about invasive testing such as spinal fluid testing and/or nerve and muscle biopsy and/or repeating her electrophysiological testing but before we resort to doing this, would like to suggest a second opinion with one of my neuromuscular colleagues. To that end I requested a consultation with Dr. Jaynee Eagles. The patient is in agreement. I did not suggest any new symptomatic treatment. I will see her back after she has had a chance to see Dr. Jaynee Eagles. If the patient and Dr. Jaynee Eagles are in agreement, she can also continue care with her depending on further workup and recommendations. She was in agreement.

## 2014-04-23 NOTE — Patient Instructions (Signed)
I would like for you to see Dr. Jaynee Eagles for another opinion and potentially ongoing care. She is specialized in neuromuscular diseases.

## 2014-04-25 ENCOUNTER — Telehealth: Payer: Self-pay | Admitting: *Deleted

## 2014-04-25 NOTE — Telephone Encounter (Signed)
Calling patient to scheduled appointment, patient referred to Dr Jaynee Eagles by Dr Rexene Alberts, no answer left message for patient to return the call and schedule appointment with Dr. Jaynee Eagles.

## 2014-05-16 ENCOUNTER — Ambulatory Visit: Payer: 59 | Admitting: Neurology

## 2014-05-17 ENCOUNTER — Ambulatory Visit (INDEPENDENT_AMBULATORY_CARE_PROVIDER_SITE_OTHER): Payer: 59 | Admitting: Neurology

## 2014-05-17 ENCOUNTER — Encounter: Payer: Self-pay | Admitting: Neurology

## 2014-05-17 VITALS — BP 117/80 | HR 79 | Ht 65.5 in | Wt 175.2 lb

## 2014-05-17 DIAGNOSIS — G609 Hereditary and idiopathic neuropathy, unspecified: Secondary | ICD-10-CM

## 2014-05-17 NOTE — Progress Notes (Addendum)
[facility]    Provider:  Dr Jaynee Eagles Referring Provider: No ref. provider found Primary Care Physician:  PROVIDER NOT IN SYSTEM  CC:  Foot numbness  HPI:  [name] is a 58 y.o. female here as a consult from Dr. No ref. provider found for foot numbness. She has seen Dr. Rexene Alberts in our office and is here to me as a second opinion. She has a past medical history of vitamin D deficiency, insomnia, hypertension, hyperlipidemia, allergic rhinitis, sarcoidosis of the lung, and recent diagnosis of diabetes, who presents for evaluation of her bilateral foot numbness. Started over a year ago. Numbness started in the left pinky toe and felt like it was falling asleep and it has progressed to now it is both leags and up to the thighs and arms. Continuous. Worse at night. No cramping. Balance is "off" can see a difference, has to hold on to stairs. At night is severe, doesn't sleep. Not tolerating the gabapentin. Makes her dizzy in the morning. Takes alleve PM at night. Hasn't tried anything else for the sensations. Nothing makes it better, epsom salts doesn't help. Associated weakness, feels extra heavy. At the time it started, no new drugs: no previous history of amiodarone, flagyl, meds for gout or chemo. Was on a medication for sarcoid methotrexate and stopped taking in in 2013 but didn't have symptoms at that time. No radicular symptoms.  Reviewed notes, labs and imaging from outside physicians, which showed: ANA negative, CRP/RF/SED/Lyme all WNL. Has been followed by Dr. Rexene Alberts who notes complaints of pain and paresthesias in the past, gabapentin helped but made her sleepy, she has a prior exposure to methotrexate, previous emg/ncs showed mild axonal neuropathy. Brain MRI in 2014 and lumbar mri was  unremarkable. Previous CK, ACE, B12, IFE and rpr were unremarkable. Heavy metals negative, lyme negative. hga1c was 6.2 in  lfts have been largely unremarkable only elevated alkphos. hemoglobin A1c was 6.2 in July 2014.   Review of Systems: Patient complains of symptoms per HPI as well as the following symptoms: weight gain, fatigue, SOB, cough, feeling hot, joint pain, allergies, moles, frequent infections, memory loss, numbness, weakness, restless legs . Pertinent negatives per HPI. All others negative.   History   Social History  . Marital Status: Divorced    Spouse Name: N/A    Number of Children: 2  . Years of Education: college   Occupational  History  . [place] History Main Topics  . Smoking status: Never Smoker   . Smokeless tobacco: Never Used  . Alcohol Use: No  . Drug Use: Not on file  . Sexual Activity: Yes    Partners: Female   Other Topics Concern  . Not on file   Social History Narrative   Patient lives at home alone.   Caffeine use: 4-5 cups daily    Family History  Problem Relation Age of Onset  . Stroke Father   . Heart murmur Father   . Diabetes Father   . Emphysema Mother   . COPD Mother   . Breast cancer Sister     Past Medical History  Diagnosis Date  . Cough   . Sarcoidosis   . Hypertension   . Diabetes mellitus without complication   . Chronic sinusitis     Past Surgical History  Procedure Laterality Date  . Nose surgery    . Tubal ligation    . Vesicovaginal fistula closure w/ tah    . Lymphadenectomy      Current Outpatient Prescriptions  Medication Sig Dispense Refill  . aspirin 81 MG tablet Take 81 mg by mouth daily.      Marland Kitchen aspirin-acetaminophen-caffeine (EXCEDRIN MIGRAINE) 250-250-65 MG per tablet Take 1 tablet by mouth every 6 (six) hours as needed for pain.      . Cholecalciferol (VITAMIN D-3) 5000 UNITS TABS Take 5,000 Units by mouth daily.      . ergocalciferol (VITAMIN D2) 50000 UNITS  capsule Take 50,000 Units by mouth once a week.      . fenofibrate 160 MG tablet Take 160 mg by mouth daily.      . fish oil-omega-3 fatty acids 1000 MG capsule Take 2 g by mouth daily.      . fluticasone (FLONASE) 50 MCG/ACT nasal spray Place 1 spray into both nostrils daily as needed.  48 g  1  . gabapentin (NEURONTIN) 100 MG capsule Take 300 mg by mouth daily as needed.      . Ibuprofen (MOTRIN PO) Take 200 mg by mouth as needed.      . imiquimod (ALDARA) 5 % cream Apply to affected area three times weekly  12 each  1  . loratadine (CLARITIN) 10 MG tablet TAKE ONE TABLET BY MOUTH EVERY DAY  30 tablet  3  . losartan-hydrochlorothiazide (HYZAAR) 50-12.5 MG per tablet Take 1 tablet by mouth daily.      . Multiple Vitamin (MULTIVITAMIN) capsule Take 1 capsule by mouth daily.        . Naproxen Sod-Diphenhydramine (ALEVE PM PO) Take 1 tablet by mouth at bedtime as needed and may repeat dose one time if needed.      . niacin (NIASPAN) 1000 MG CR tablet Take 1,000 mg by mouth at bedtime.      . [DISCONTINUED] methotrexate (RHEUMATREX) 2.5 MG tablet 5 tablets every 7 days  20 tablet  5   No current facility-administered medications for this visit.    Allergies as of 05/17/2014  . (No Known Allergies)    Vitals: BP 117/80  Pulse 79  Ht 5' 5.5" (1.664 m)  Wt 175 lb 3.2 oz (79.47 kg)  BMI 28.70 kg/m2 Last Weight:  Wt Readings from Last 1 Encounters:  05/17/14 175 lb 3.2 oz (79.47 kg)   Last Height:   Ht Readings from Last 1 Encounters:  05/17/14 5' 5.5" (1.664 m)    Physical exam: Exam:  Gen: NAD, conversant, well nourised, well groomed                     CV: RRR, no MRG. No Carotid Bruits. No peripheral edema, warm, nontender Eyes: Conjunctivae clear without exudates or hemorrhage  Neuro: Detailed Neurologic Exam  Speech:    Speech is normal; fluent and spontaneous with normal comprehension.  Cognition:    The patient is oriented to person, place, and time;     recent and  remote memory intact;     language fluent;     normal attention, concentration,    fund of knowledge Cranial Nerves:    The pupils are equal, round, and reactive to light. The fundi are normal and spontaneous venous pulsations are present. Visual fields are full to finger confrontation. Extraocular movements are intact. Trigeminal sensation is intact and the muscles of mastication are normal. The face is symmetric. The palate elevates in the midline. Voice is normal. Shoulder shrug is normal. The tongue has normal motion without fasciculations.   Coordination:    Normal finger to nose and heel to shin.   Gait:    Not ataxic  Motor Observation:    No asymmetry, no atrophy, and no involuntary movements noted. Tone:    Normal muscle tone.    Posture:    Posture is normal. normal erect    Strength:    Strength is V/V in the upper and lower limbs.      Sensation:   PP and temp impaired in glove and stocking distribution, to knees and wrist bilaterally 10 seconds great toe Vibration Vibration intact in the fingers Impaired toes proprioception  Reflex Exam:  DTR's:    Deep tendon reflexes in the upper and lower extremities are normal bilaterally.   Toes:    The toes are downgoing bilaterally.   Clonus:    Clonus is absent.      Assessment/Plan:  58 year old female with numbness in the extremities, started a year ago in the left digit 5 of foot and progressed to arms and legs. Exam significant for decreased pp and temp to knees and wrists and impaired proprioception. She had an extensive and complete workup by Dr. Rexene Alberts as well as her PCP. Risk factor is elevated HgbA1c. Also peripheral neuropathy is a rare manifestation of sarcoidosis. Will repeat EMG/NCS to compare to results from a year ago. May consider nerve biopsy to look for evidence of granulomas.    Sarina Ill, MD  [facility] [address] [place], [place] [address]  Phone  [phone] Fax [phone] Lenor Coffin

## 2014-05-17 NOTE — Patient Instructions (Addendum)
Overall you are doing fairly well but I do want to suggest a few things today:   Remember to drink plenty of fluid, eat healthy meals and do not skip any meals. Try to eat protein with a every meal and eat a healthy snack such as fruit or nuts in between meals. Try to keep a regular sleep-wake schedule and try to exercise daily, particularly in the form of walking, 20-30 minutes a day, if you can.   As far as your medications are concerned, I would like to suggest alpha-lipoic acid 600mg  daily  As far as diagnostic testing: labs, emg/ncs  I would like to see you back this month or early next month for an emg/ncs, sooner if we need to. Please call us with any interim questions, concerns, problems, updates or refill requests.   Please also call us for any test results so we can go over those with you on the phone.  My clinical assistant and will answer any of your questions and relay your messages to me and also relay most of my messages to you.   Our phone number is [phone]. We also have an after hours call service for urgent matters and there is a physician on-call for urgent questions. For any emergencies you know to call 911 or go to the nearest emergency room

## 2014-05-18 LAB — HIV ANTIBODY (ROUTINE TESTING W REFLEX)
HIV 1/O/2 Abs-Index Value: <1 (ref <1.00)
HIV-1/HIV-2 Ab: NONREACTIVE

## 2014-05-18 LAB — PAN-ANCA
ANCA Proteinase 3: <3.5 U/mL (ref 0.0–3.5)
P-ANCA: <1:20 {titer}

## 2014-05-18 LAB — TSH: TSH: 0.927 u[IU]/mL (ref 0.450–4.500)

## 2014-05-18 LAB — VITAMIN B12: Vitamin B-12: 1028 pg/mL — ABNORMAL HIGH (ref 211–946)

## 2014-05-18 LAB — RPR: RPR: NONREACTIVE

## 2014-05-20 DIAGNOSIS — G609 Hereditary and idiopathic neuropathy, unspecified: Secondary | ICD-10-CM | POA: Insufficient documentation

## 2014-05-21 ENCOUNTER — Other Ambulatory Visit: Payer: Self-pay | Admitting: Neurology

## 2014-05-21 NOTE — Telephone Encounter (Signed)
PREVIOUS OV NOTE SAYS: She did not like the way Neurontin made her feel so she stopped it.

## 2014-05-28 ENCOUNTER — Ambulatory Visit (INDEPENDENT_AMBULATORY_CARE_PROVIDER_SITE_OTHER): Payer: 59 | Admitting: Neurology

## 2014-05-28 ENCOUNTER — Encounter (INDEPENDENT_AMBULATORY_CARE_PROVIDER_SITE_OTHER): Payer: Self-pay | Admitting: Radiology

## 2014-05-28 DIAGNOSIS — G609 Hereditary and idiopathic neuropathy, unspecified: Secondary | ICD-10-CM

## 2014-05-28 DIAGNOSIS — Z0289 Encounter for other administrative examinations: Secondary | ICD-10-CM

## 2014-05-28 NOTE — Progress Notes (Addendum)
  [facility]    History: [name] is a 58 y.o. female here for foot numbness. She has a past medical history of vitamin D deficiency, insomnia, hypertension, hyperlipidemia, allergic rhinitis, sarcoidosis of the lung, and recent diagnosis of diabetes, who presents for evaluation of her bilateral foot numbness. Started over a year ago. Numbness started in the left pinky toe and felt like it was falling asleep and it has progressed to now it is both leags and up to the thighs and arms. Continuous. Worse at night. No cramping. Balance is "off" can see a difference, has to hold on to railings when walking up stairs. Exam significant for decreased pp and temp to knees and wrists and impaired proprioception.  Summary: Nerve conduction studies were performed on the bilateral lower extremities.    The bilateral Peroneal and Tibial motor nerves were within normal limits with normal F wave latencies The bilateral Peroneal and Sural sensory nerves were within normal limits Bilateral H Reflexes were within normal limits.   EMG needle study was performed on selected left lower extremity muscles. The following muscles were normal: Anterior Tibialis, Vastus Medialis, Medial Gastrocnemius, Extensor Hallucis Longus and Abductor Hallucis.   Assessment/Plan: This is a normal study. No electrophysiologic evidence for peripheral polyneuropathy. However a small fiber neuropathy could be responsible for patient's symptoms and still evade detection by NCS. Would consider skin biopsy as clinically warranted.   Sarina Ill, MD  [facility] [address] [place], [place] [address]  Phone [phone] Fax [phone] Lenor Coffin

## 2014-05-29 ENCOUNTER — Telehealth: Payer: Self-pay | Admitting: Neurology

## 2014-05-29 NOTE — Telephone Encounter (Signed)
Discussed labs with patient when she came in for EMG/NCS recently

## 2014-11-08 ENCOUNTER — Ambulatory Visit (INDEPENDENT_AMBULATORY_CARE_PROVIDER_SITE_OTHER): Payer: 59 | Admitting: Emergency Medicine

## 2014-11-08 ENCOUNTER — Encounter (INDEPENDENT_AMBULATORY_CARE_PROVIDER_SITE_OTHER): Payer: Self-pay

## 2014-11-08 ENCOUNTER — Ambulatory Visit (INDEPENDENT_AMBULATORY_CARE_PROVIDER_SITE_OTHER)
Admission: RE | Admit: 2014-11-08 | Discharge: 2014-11-08 | Disposition: A | Discharge status: Home / Self Care | Payer: 59 | Source: Ambulatory Visit | Attending: Emergency Medicine | Admitting: Emergency Medicine

## 2014-11-08 ENCOUNTER — Encounter: Payer: Self-pay | Admitting: Emergency Medicine

## 2014-11-08 DIAGNOSIS — D869 Sarcoidosis, unspecified: Secondary | ICD-10-CM

## 2014-11-08 MED ORDER — ESOMEPRAZOLE MAGNESIUM 40 MG PO CPDR: DELAYED_RELEASE_CAPSULE | ORAL | Status: DC

## 2014-11-08 NOTE — Patient Instructions (Signed)
We will perform a chest x-ray today Please start Nexium 40 mg twice a day for 7 days and then decrease to once a day. Take this medication either 1 hour before or 1 hour after eating.  Follow with Dr Lamonte Sakai in 3 months or sooner if you have any problems.

## 2014-11-08 NOTE — Progress Notes (Signed)
Ms. Yacoub is a very pleasant 59 year old woman with a history of stable sarcoidosis. Last seen 11/08. She is not on maintanance meds for sarcoidosis. Since last visit has had sinus surgery for sarcoid involvement [name]). Post-op course comp by MRSA, treated for eradication. Tricor started in 8/09. Seen at 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   [name] 06/16/10 -- 59 yo woman, sarcoidosis. Has had recurrence of her dry cough, a tickle in throat. Frequent throat clearing. Has allergy symptoms, clear congestion. Does NSW occasionally. Not on an allergy regimen. PFT's done today, FEV1 and FVC down slightly from 2007.   [name] 07/23/10 -- Sarcoidosis, allergies/chronic cough, sinus disease (from sarcoid). Stopped lisinopril last time (2 mo ago). Tessalon Perles called in but she hasn't picked up. Continues to cough, has been doing so for a year!. She started nasal saline washes and fluticasone spray.   [name] 08/21/10 -- returns for sarcoidosis and cough. last time I treated her with prednisone for her progressive symptoms and cavitary changes on CT scan of the chest. She began to feel better on th epred - more energy, a bit less cough. She did have insomnia w the pred. Since 12/24 has had URI symptoms, lost her voice. Some relief from alka-seltzer plus, mucinex. Also on fluticasone spray and NSW two times a day.   [name] 09/16/09 -- sarcoidosis, abnormal CT scan. repeated 12/30. She has had persistent cough since last time, still w hoarse voice, globus sensation. Doing NSW's. Greenish/yellow stuff nasal drainage. Breathing is stable.   [name] 11/03/10 -- sarcoidosis, changes on CT scan. Rx for suspected chronic sinusitis w clinda 4 wks, mucous now clear, still drains. We also treated her with another round prednisone for more inflammation around UL cavity, now off. She is exercising more, cough is better.   [name] 04/14/11 -- sarcoidosis, changes on CT scan . She has had a return of her purulent nasal  drainage - went back on residual clinda that she had available. The mucous is starting to clear. She nasal saline washes daily. Takes loratadine and flonase. Her cough is more bothersome - more SOB. Denies rash.   [name] 05/08/11 -- sarcoidosis, abnormal CT scan. We repeated scan on 8/22 - shows active RML disease, apical B bullae, no nodules. She continues to feel fatigued, have dry cough. We discussed starting methotrexate given the progressive changes on Ct scan of the chest   [name] 05/26/11 -- sarcoidosis, abnormal CT scan as above. Started Pred 40mg  qd last visit.   [name] 06/24/11 -- sarcoidosis with cavitary dz on CT scan as below. Last time we decreased pred to 20mg  qd - has had more cough and drainage since that change. We discussed possibly starting MTX last time - LFT and hepatitis panel were normal last time. She wants to wait until after the holidays to start the MTX.   [name] 09/15/11 -- sarcoidosis with cavitary dz on CT scan. On chronic pred, and have been discussing possible change to MTX. She began to have sinus drainage, green,  and cough about 3 weeks ago. She has has some HA, no fevers. She is on loratadine. Had to use albuterol a few times since last time.   [name] 09/25/11 -- sarcoidosis with cavitary dz on CT scan. On chronic pred, and have been discussing possible change to MTX. Deferred last OV due to possible acute sinusitis, rx w augmentin. She is on [name], loratadine, nasal steroid. The drainage has cleared, no longer green.   [name] 10/30/11 -- sarcoidosis with  cavitary dz on CT scan. On chronic pred, last time we started MTX 7.5mg  qweek with folate qd. LFT's and CBC's have been stable/normal. Returns today for f/u. She has had a little nausea, sluggishness. Still has a dry cough, nasal gtt. NSW 2x a day, loratadine. She has occasional GERD sx. She has some chest tightness, doesn't really use SABA.  >>  Acute OV 11/19/11 Complains of pt c/o yellow prod cough and chest tightness and achy chest x  1week worsening. Pt has used OTC Mucinex with no relief. Patient complains of sinus pain, and pressure. Over-the-counter medications are not helping at all. Cough is keeping her up at night. She is currently on methotrexate and Prednisone 20mg  daily for her sarcoidosis. She denies any hemoptysis, nausea, vomiting, diarrhea, edema, or rash.  [name] 12/03/11 -- sarcoidosis with cavitary dz on CT scan. On chronic pred, on MTX 7.5mg  qweek with folate qd since 09/25/11. Was seen 3/28 as above for productive cough, ? Sinusitis/Bronchitis, treated with with augmentin. Drainage from her head and cough are better. LFT and CBC have been normal on the MTX. She held her MTX last week of March while she was sick, then started back. She feels that the MTX is making her more lethargic, feels "heavy".   [name] 02/04/12 -- sarcoidosis with cavitary dz on CT scan. On chronic pred, on MTX qweek with folate qd since 09/25/11. Since last time treated her for sinusitis w abx, now better. Last time we decreased the pred to 10mg .   [name] 04/14/12 -- sarcoidosis with cavitary dz on CT scan. On chronic pred, on MTX 12.5 mg qweek with folate qd since 09/25/11. She has been having more sinus drainage, green phlegm, no blood, + HA. She is having associated cough.   [name] 05/23/12 -- sarcoidosis with cavitary dz on CT scan. On chronic pred, on MTX 12.5 mg qweek with folate qd since 09/25/11. Last time we started Augmentin +  NSW for sinus dz. Her sx were better whiloe she was on the augmentin, then worsened when off  [name] 07/07/12 -- sarcoidosis with cavitary dz on CT scan. She is on MTX 12.5mg  + folate, off pred since Summer 2013. She was treated for chronic sinusitis, about to finish 6 weeks. She is still having throat clearing, dry cough, chest tightness. She has nasonex, has not been using astelin. She has never benefited in the past from PPI or SABA.   [name] 07/27/12 -- sarcoidosis with cavitary dz on CT scan. She is on MTX 12.5mg  + folate, off pred  since Summer 2013. Last time checked CT scan sinuses and chest. The chest Ct scan is stable with some resolution of inflammatory changes. CT sinuses with stable chronic changes, chronic paranasal sinusitis. She continues to have globus sensation, dry cough. She has had some nose bleeding w astelin   [name] 08/04/13 -- sarcoidosis with cavitary dz on CT scan. She is on MTX 12.5mg  + folate, off pred since Summer 2013. We decreased MTX last time to 10mg . I haven't seen her for a year. She has been dealing with other problems, seeing Neuro for peripheral neuropathy, not felt to relate to sarcoidosis. She went ahead and decreased the MTX down to zero - she has been off since March. Her breathing has been stable, she still has allergies sx and drainage, cough.   [name] 08/11/13 -- sarcoidosis with cavitary dz on CT scan, now off MTX and pred. We repeated CT scan on 12/18 > no significant changes compared with 06/2012.  She is feeling well, stable.   [name] 02/27/14 -- 59 yo woman that we have followed for sarcoidosis with sinus and cavitary pulm manifestations. She came off MTX in 10/2012. Repeat Ct scan done and is overall stable but quite abnormal > particularly at the B bases where there is honeycomb change and probable GG as well. She has seen neuro and rheum Amil Amen) for a peripheral neuropathy in her feet moving more proximally, associated with ankle pain and now shoulder pain.   [name] 04/18/14 -- follow up visit for stage 2-3 sarcoidosis, cavitary lesions, sinus disease.  We just did an empiric month of prednisone. Her breathing did not change - she did have side effects from the pred.  She doesn't believe the pred changed her neuropathy. She feels weaker now.    [name] 11/08/14 -- follow up visit for sarcoidosis with significant CT scan abnormalities.  Last CT scan was stable but severe disease, didn't seem to change much after empiric steroids. Since our last visit she has been rx with abx x 1, and then with prednisone  [name] FP) x 10 days for cough. She continues to have cough.  She continues her steroid nasal spray, nasal saline, loratadine. She continues to cough, non-productive usually. She still has neuropathy up to her shins. She indicates that her cough is worse after eating or when supine.    EXAM :  Filed Vitals:   11/08/14 1354  BP: 128/92  Pulse: 75  Height: 5' 5.5" (1.664 m)  Weight: 162 lb (73.483 kg)  SpO2: 98%   Gen: Pleasant, well-nourished, in no distress,  normal affect  ENT: No lesions,  mouth clear,  oropharynx clear, no postnasal drip, max sinus tenderness   Neck: No JVD, no TMG, no carotid bruits  Lungs: No use of accessory muscles, no dullness to percussion, coarse BS with no wheezing   Cardiovascular: RRR, heart sounds normal, no murmur or gallops, no peripheral edema  Musculoskeletal: No deformities, no cyanosis or clubbing  Neuro: alert, non focal  Skin: Warm, no lesions or rashes    CT scan Chest 04/13/14 --  COMPARISON: 02/12/2014.  FINDINGS:  Calcified and noncalcified mediastinal lymph nodes measure up to 1.6  cm in the lower right paratracheal station, as before. Axillary  lymph nodes measure up to 1.8 cm on the left, stable. Coronary  artery calcification. Heart is at the upper limits of normal in  size. No pericardial effusion.  Upper lobe predominant cavitation, bronchiectasis, ground-glass and  volume loss are unchanged. Small cavity with internal debris versus  focal bronchiectasis and volume loss (image 6, series 5). Basilar  predominant bronchiectasis and honeycombing are again noted. 7 mm  nodule along the left major fissure is unchanged. No pleural fluid.  Airway is otherwise unremarkable. No air trapping.  Incidental imaging of the upper abdomen shows the visualized  portions of the liver and adrenal glands to be grossly unremarkable.  1.3 cm low-attenuation lesion in the upper pole right kidney is  unchanged. Visualized portions of the  kidneys and spleen are  otherwise unremarkable. Calcifications are seen in the pancreas. No  worrisome lytic or sclerotic lesions.  IMPRESSION:  1. Mediastinal/hilar and pulmonary parenchymal changes of sarcoid,  stable.  2. Probable focal bronchiectasis and volume loss in the apical  portion of the left upper lobe. Difficult to exclude a small  mycetoma.  3. Bibasilar fibrosis, as before, indicative of usual interstitial  pneumonitis (UIP).  4. Coronary artery calcification.  .  CT sinuses  07/13/12 --  Comparison: 04/15/2011.  Findings: Negative visualized noncontrast brain parenchyma.  Grossly normal orbit and face soft tissues.  Visualized tympanic cavities are clear.  Visualized sphenoid sinuses are clear.  Previous maxillary antrostomies, turbinatectomies, and  ethmoidectomies. Stable mucoperiosteal thickening throughout the  maxillary and residual ethmoid sinuses.  Frontal sinuses left well visualized today but appear clear.  No acute osseous abnormality identified.  IMPRESSION:  Extensive changes of paranasal sinus surgery again noted with  chronic paranasal sinusitis, no significant change since 2012 and  no new areas of involvement.    SARCOIDOSIS Her primary symptom currently is cough. I suspect that this is mostly related to upper airway irritation possibly exacerbated by her rhinitis and nasal sarcoid. I suspect also based on her reports that she has untreated esophageal reflux which may be contributing to her cough as well. I would like to continue her on her rhinitis regimen and start her on an empiric regimen for GERD to see if this impacts her cough. We will check a chest x-ray today to compare with her priors. Her most recent imaging did not show any change in her severe cavitary disease. She is not on any immunosuppressive medications at this time. She would very much like to avoid restarting prednisone or methotrexate.   COUGH Please see discussion  above

## 2014-11-08 NOTE — Assessment & Plan Note (Signed)
Her primary symptom currently is cough. I suspect that this is mostly related to upper airway irritation possibly exacerbated by her rhinitis and nasal sarcoid. I suspect also based on her reports that she has untreated esophageal reflux which may be contributing to her cough as well. I would like to continue her on her rhinitis regimen and start her on an empiric regimen for GERD to see if this impacts her cough. We will check a chest x-ray today to compare with her priors. Her most recent imaging did not show any change in her severe cavitary disease. She is not on any immunosuppressive medications at this time. She would very much like to avoid restarting prednisone or methotrexate.

## 2014-11-08 NOTE — Assessment & Plan Note (Signed)
Please see discussion above

## 2014-11-13 ENCOUNTER — Telehealth: Payer: Self-pay | Admitting: Emergency Medicine

## 2014-11-13 MED ORDER — AZITHROMYCIN 250 MG PO TABS: ORAL_TABLET | ORAL | Status: DC

## 2014-11-13 NOTE — Telephone Encounter (Signed)
Pt reports she is taking her allergy medication daily. ZPAK called in. She reports her nexium is not covered by insurance.  Called [facility] and was advised no alternatives provided. Do you want to do PA or change to omep or protonix? thanks

## 2014-11-13 NOTE — Telephone Encounter (Signed)
I suspect she needs to maximize allergy meds if not already. Please confirm she is taking Call in azithro Z-pack

## 2014-11-13 NOTE — Telephone Encounter (Signed)
Called and spoke to pt. Pt c/o worsening cough with little mucus production- pale yellow in color, increase in SOB when active and chest tightness. Pt denies f/c/s. Pt stated she had bronchitis several years ago and it feels similar and she is now worried she has bronchitis.   RB please advise.   No Known Allergies  Current Outpatient Prescriptions on File Prior to Visit  Medication Sig Dispense Refill  . aspirin 81 MG tablet Take 81 mg by mouth daily.    Marland Kitchen aspirin-acetaminophen-caffeine (EXCEDRIN MIGRAINE) 250-250-65 MG per tablet Take 1 tablet by mouth every 6 (six) hours as needed for pain.    . Cholecalciferol (VITAMIN D-3) 5000 UNITS TABS Take 5,000 Units by mouth daily.    . ergocalciferol (VITAMIN D2) 50000 UNITS capsule Take 50,000 Units by mouth once a week.    . esomeprazole (NEXIUM) 40 MG capsule Take 1 capsule twice a day for 1 week, then take 1 capsule daily 45 capsule 0  . fenofibrate 160 MG tablet Take 160 mg by mouth daily.    . fish oil-omega-3 fatty acids 1000 MG capsule Take 2 g by mouth daily.    . fluticasone (FLONASE) 50 MCG/ACT nasal spray Place 1 spray into both nostrils daily as needed. 48 g 1  . gabapentin (NEURONTIN) 100 MG capsule Take 300 mg by mouth daily as needed.    . Ibuprofen (MOTRIN PO) Take 200 mg by mouth as needed.    . loratadine (CLARITIN) 10 MG tablet TAKE ONE TABLET BY MOUTH EVERY DAY 30 tablet 3  . losartan-hydrochlorothiazide (HYZAAR) 50-12.5 MG per tablet Take 1 tablet by mouth daily.    . Multiple Vitamin (MULTIVITAMIN) capsule Take 1 capsule by mouth daily.      . Naproxen Sod-Diphenhydramine (ALEVE PM PO) Take 1 tablet by mouth at bedtime as needed and may repeat dose one time if needed.    . niacin (NIASPAN) 1000 MG CR tablet Take 1,000 mg by mouth at bedtime.    . [DISCONTINUED] methotrexate (RHEUMATREX) 2.5 MG tablet 5 tablets every 7 days 20 tablet 5   No current facility-administered medications on file prior to visit.

## 2014-11-14 NOTE — Telephone Encounter (Signed)
If protonix is an option, go to 40mg  qd

## 2014-11-14 NOTE — Telephone Encounter (Signed)
Called and spoke to [facility]. There are indeed no covered alternatives, however, Protonix 40mg  tablets delayed release will still need a PA but no step therapy will be required (no trial and fail meds before approval).  RB is this ok to initiate PA or would you like to do a PA on Nexium?

## 2014-11-23 NOTE — Telephone Encounter (Signed)
RB - please advise. 

## 2014-12-03 NOTE — Telephone Encounter (Signed)
RB please advise. Thanks.  

## 2014-12-04 NOTE — Telephone Encounter (Signed)
PA started for Protonix through CoverMyMeds Key: [id number] Awaiting response.

## 2014-12-04 NOTE — Telephone Encounter (Signed)
Yes OK to do PA for this. Thanks

## 2014-12-07 NOTE — Telephone Encounter (Signed)
Can we find out what is formulary for her, then call the patient and ask her if she wants [place] to order the formulary medication?  If the insurance / pharmacy won't tell us what is formulary then see if the patient can find out.

## 2014-12-07 NOTE — Telephone Encounter (Signed)
I thought that she had tried and failed omeprazole, but we can try this if she is willing. Omeprazole 20mg daily

## 2014-12-07 NOTE — Telephone Encounter (Signed)
Protonix PA has been denied.   RB do you wish to proceed with an appeal, or do you have an alternative med to replace with?

## 2014-12-07 NOTE — Telephone Encounter (Signed)
Called and spoke with patient- states that the formulary is on the Internet.  Per formulary on [facility] website, we have tried to get coverage on all available "covered" meds and alternatives and all have been denied.  Can patient try OTC Omeprazole?  Please advise Dr Lamonte Sakai. thanks

## 2014-12-10 NOTE — Telephone Encounter (Signed)
Pt is aware of RB's recommendation. Nothing further was needed.

## 2015-04-08 ENCOUNTER — Telehealth: Payer: Self-pay | Admitting: Emergency Medicine

## 2015-04-08 NOTE — Telephone Encounter (Signed)
lmomtcb x1 for pt 

## 2015-04-08 NOTE — Telephone Encounter (Signed)
Please have her take doxycycline 100 mg twice a day for 7 days Pred taper: Take 40mg  daily for 3 days, then 30mg  daily for 3 days, then 20mg  daily for 3 days, then 10mg  daily for 3 days, then stop If no better this week then she needs an office visit

## 2015-04-08 NOTE — Telephone Encounter (Signed)
Called spoke with pt. She c/o prod cough (yellow-green phlem), wheezing, chest tx, PND, nasal cong, chest cong x few days. Pt requesting recs. She has been taking hydrocodone cough syrup for cough. Please advise Dr. Lamonte Sakai thanks

## 2015-04-09 MED ORDER — PREDNISONE 10 MG PO TABS
ORAL_TABLET | ORAL | Status: DC
Start: 2015-04-09 — End: 2015-06-07

## 2015-04-09 MED ORDER — DOXYCYCLINE HYCLATE 100 MG PO TABS: 100.0000 mg | ORAL_TABLET | Freq: Two times a day (BID) | ORAL | Status: DC

## 2015-04-09 NOTE — Telephone Encounter (Signed)
Called spoke with pt and is aware of recs. RX's sent in. Nothing further needed 

## 2015-04-30 ENCOUNTER — Ambulatory Visit (INDEPENDENT_AMBULATORY_CARE_PROVIDER_SITE_OTHER): Payer: 59 | Admitting: Emergency Medicine

## 2015-04-30 ENCOUNTER — Encounter: Payer: Self-pay | Admitting: Emergency Medicine

## 2015-04-30 VITALS — BP 120/74 | HR 70 | Ht 65.0 in | Wt 156.4 lb

## 2015-04-30 DIAGNOSIS — J320 Chronic maxillary sinusitis: Secondary | ICD-10-CM

## 2015-04-30 DIAGNOSIS — D869 Sarcoidosis, unspecified: Secondary | ICD-10-CM | POA: Diagnosis not present

## 2015-04-30 MED ORDER — CLINDAMYCIN HCL 150 MG PO CAPS: 450.0000 mg | ORAL_CAPSULE | Freq: Three times a day (TID) | ORAL | Status: DC

## 2015-04-30 NOTE — Progress Notes (Signed)
[name] is a very pleasant 59 year old woman with a history of stable sarcoidosis. Last seen 11/08. She is not on maintanance meds for sarcoidosis. Since last visit has had sinus surgery for sarcoid involvement [name]). Post-op course comp by MRSA, treated for eradication. Tricor started in 8/09. Seen at [facility] regarding possible ocular sarcoidosis.   [name] 06/16/10 -- 59 yo woman, sarcoidosis. Has had recurrence of her dry cough, a tickle in throat. Frequent throat clearing. Has allergy symptoms, clear congestion. Does NSW occasionally. Not on an allergy regimen. PFT's done today, FEV1 and FVC down slightly from 2007.   [name] 07/23/10 -- Sarcoidosis, allergies/chronic cough, sinus disease (from sarcoid). Stopped lisinopril last time (2 mo ago). Tessalon Perles called in but she hasn't picked up. Continues to cough, has been doing so for a year!. She started nasal saline washes and fluticasone spray.   [name] 08/21/10 -- returns for sarcoidosis and cough. last time I treated her with prednisone for her progressive symptoms and cavitary changes on CT scan of the chest. She began to feel better on th epred - more energy, a bit less cough. She did have insomnia w the pred. Since 12/24 has had URI symptoms, lost her voice. Some relief from alka-seltzer plus, mucinex. Also on fluticasone spray and NSW two times a day.   [name] 09/16/09 -- sarcoidosis, abnormal CT scan. repeated 12/30. She has had persistent cough since last time, still w hoarse voice, globus sensation. Doing NSW's. Greenish/yellow stuff nasal drainage. Breathing is stable.   [name] 11/03/10 -- sarcoidosis, changes on CT scan. Rx for suspected chronic sinusitis w clinda 4 wks, mucous now clear, still drains. We also treated her with another round prednisone for more inflammation around UL cavity, now off. She is exercising more, cough is better.   [name] 04/14/11 -- sarcoidosis, changes on CT scan . She has had a return of her purulent  nasal drainage - went back on residual clinda that she had available. The mucous is starting to clear. She nasal saline washes daily. Takes loratadine and flonase. Her cough is more bothersome - more SOB. Denies rash.   [name] 05/08/11 -- sarcoidosis, abnormal CT scan. We repeated scan on 8/22 - shows active RML disease, apical B bullae, no nodules. She continues to feel fatigued, have dry cough. We discussed starting methotrexate given the progressive changes on Ct scan of the chest   [name] 05/26/11 -- sarcoidosis, abnormal CT scan as above. Started Pred 40mg  qd last visit.   [name] 06/24/11 -- sarcoidosis with cavitary dz on CT scan as below. Last time we decreased pred to 20mg  qd - has had more cough and drainage since that change. We discussed possibly starting MTX last time - LFT and hepatitis panel were normal last time. She wants to wait until after the holidays to start the MTX.   [name] 09/15/11 -- sarcoidosis with cavitary dz on CT scan. On chronic pred, and have been discussing possible change to MTX. She began to have sinus drainage, green,  and cough about 3 weeks ago. She has has some HA, no fevers. She is on loratadine. Had to use albuterol a few times since last time.   [name] 09/25/11 -- sarcoidosis with cavitary dz on CT scan. On chronic pred, and have been discussing possible change to MTX. Deferred last OV due to possible acute sinusitis, rx w augmentin. She is on [name], loratadine, nasal steroid. The drainage has cleared, no longer green.   [name] 10/30/11 -- sarcoidosis  with cavitary dz on CT scan. On chronic pred, last time we started MTX 7.5mg  qweek with folate qd. LFT's and CBC's have been stable/normal. Returns today for f/u. She has had a little nausea, sluggishness. Still has a dry cough, nasal gtt. NSW 2x a day, loratadine. She has occasional GERD sx. She has some chest tightness, doesn't really use SABA.  >>  Acute OV 11/19/11 Complains of pt c/o yellow prod cough and chest tightness and achy  chest x 1week worsening. Pt has used OTC Mucinex with no relief. Patient complains of sinus pain, and pressure. Over-the-counter medications are not helping at all. Cough is keeping her up at night. She is currently on methotrexate and Prednisone 20mg  daily for her sarcoidosis. She denies any hemoptysis, nausea, vomiting, diarrhea, edema, or rash.  [name] 12/03/11 -- sarcoidosis with cavitary dz on CT scan. On chronic pred, on MTX 7.5mg  qweek with folate qd since 09/25/11. Was seen 3/28 as above for productive cough, ? Sinusitis/Bronchitis, treated with with augmentin. Drainage from her head and cough are better. LFT and CBC have been normal on the MTX. She held her MTX last week of March while she was sick, then started back. She feels that the MTX is making her more lethargic, feels "heavy".   [name] 02/04/12 -- sarcoidosis with cavitary dz on CT scan. On chronic pred, on MTX qweek with folate qd since 09/25/11. Since last time treated her for sinusitis w abx, now better. Last time we decreased the pred to 10mg .   [name] 04/14/12 -- sarcoidosis with cavitary dz on CT scan. On chronic pred, on MTX 12.5 mg qweek with folate qd since 09/25/11. She has been having more sinus drainage, green phlegm, no blood, + HA. She is having associated cough.   [name] 05/23/12 -- sarcoidosis with cavitary dz on CT scan. On chronic pred, on MTX 12.5 mg qweek with folate qd since 09/25/11. Last time we started Augmentin +  NSW for sinus dz. Her sx were better whiloe she was on the augmentin, then worsened when off  [name] 07/07/12 -- sarcoidosis with cavitary dz on CT scan. She is on MTX 12.5mg  + folate, off pred since Summer 2013. She was treated for chronic sinusitis, about to finish 6 weeks. She is still having throat clearing, dry cough, chest tightness. She has nasonex, has not been using astelin. She has never benefited in the past from PPI or SABA.   [name] 07/27/12 -- sarcoidosis with cavitary dz on CT scan. She is on MTX 12.5mg  + folate, off  pred since Summer 2013. Last time checked CT scan sinuses and chest. The chest Ct scan is stable with some resolution of inflammatory changes. CT sinuses with stable chronic changes, chronic paranasal sinusitis. She continues to have globus sensation, dry cough. She has had some nose bleeding w astelin   [name] 08/04/13 -- sarcoidosis with cavitary dz on CT scan. She is on MTX 12.5mg  + folate, off pred since Summer 2013. We decreased MTX last time to 10mg . I haven't seen her for a year. She has been dealing with other problems, seeing Neuro for peripheral neuropathy, not felt to relate to sarcoidosis. She went ahead and decreased the MTX down to zero - she has been off since March. Her breathing has been stable, she still has allergies sx and drainage, cough.   [name] 08/11/13 -- sarcoidosis with cavitary dz on CT scan, now off MTX and pred. We repeated CT scan on 12/18 > no significant changes compared with  06/2012. She is feeling well, stable.   [name] 02/27/14 -- 59 yo woman that we have followed for sarcoidosis with sinus and cavitary pulm manifestations. She came off MTX in 10/2012. Repeat Ct scan done and is overall stable but quite abnormal > particularly at the B bases where there is honeycomb change and probable GG as well. She has seen neuro and rheum Amil Amen) for a peripheral neuropathy in her feet moving more proximally, associated with ankle pain and now shoulder pain.   [name] 04/18/14 -- follow up visit for stage 2-3 sarcoidosis, cavitary lesions, sinus disease.  We just did an empiric month of prednisone. Her breathing did not change - she did have side effects from the pred.  She doesn't believe the pred changed her neuropathy. She feels weaker now.    [name] 11/08/14 -- follow up visit for sarcoidosis with significant CT scan abnormalities.  Last CT scan was stable but severe disease, didn't seem to change much after empiric steroids. Since our last visit she has been rx with abx x 1, and then with  prednisone [name] FP) x 10 days for cough. She continues to have cough.  She continues her steroid nasal spray, nasal saline, loratadine. She continues to cough, non-productive usually. She still has neuropathy up to her shins. She indicates that her cough is worse after eating or when supine.   [name] 04/30/15 -- follow-up visit for sarcoidosis with significant CT scan abnormalities, last done on 04/13/14.  She has been dealing with sinus fullness and maxillary pain, having thick sinus drainage. She was evaluated w a CT scan by oral surgery, saw B maxillary sinus fluid, likely chronic sinusitis.  She is having cough, chest congestion associated with the sinus disease. Before this she was feeling well, exercising.     EXAM :  Filed Vitals:   04/30/15 1523  BP: 120/74  Pulse: 70  Height: 5\' 5"  (1.651 m)  Weight: 156 lb 6.4 oz (70.943 kg)  SpO2: 99%   Gen: Pleasant, well-nourished, in no distress,  normal affect  ENT: No lesions,  mouth clear,  oropharynx clear, no postnasal drip, max sinus tenderness   Neck: No JVD, no TMG, no carotid bruits  Lungs: No use of accessory muscles, no dullness to percussion, coarse BS with no wheezing   Cardiovascular: RRR, heart sounds normal, no murmur or gallops, no peripheral edema  Musculoskeletal: No deformities, no cyanosis or clubbing  Neuro: alert, non focal  Skin: Warm, no lesions or rashes    CT scan Chest 04/13/14 --  COMPARISON: 02/12/2014.  FINDINGS:  Calcified and noncalcified mediastinal lymph nodes measure up to 1.6  cm in the lower right paratracheal station, as before. Axillary  lymph nodes measure up to 1.8 cm on the left, stable. Coronary  artery calcification. Heart is at the upper limits of normal in  size. No pericardial effusion.  Upper lobe predominant cavitation, bronchiectasis, ground-glass and  volume loss are unchanged. Small cavity with internal debris versus  focal bronchiectasis and volume loss (image 6, series 5).  Basilar  predominant bronchiectasis and honeycombing are again noted. 7 mm  nodule along the left major fissure is unchanged. No pleural fluid.  Airway is otherwise unremarkable. No air trapping.  Incidental imaging of the upper abdomen shows the visualized  portions of the liver and adrenal glands to be grossly unremarkable.  1.3 cm low-attenuation lesion in the upper pole right kidney is  unchanged. Visualized portions of the kidneys and spleen are  otherwise unremarkable.  Calcifications are seen in the pancreas. No  worrisome lytic or sclerotic lesions.  IMPRESSION:  1. Mediastinal/hilar and pulmonary parenchymal changes of sarcoid,  stable.  2. Probable focal bronchiectasis and volume loss in the apical  portion of the left upper lobe. Difficult to exclude a small  mycetoma.  3. Bibasilar fibrosis, as before, indicative of usual interstitial  pneumonitis (UIP).  4. Coronary artery calcification.  .  CT sinuses 07/13/12 --  Comparison: 04/15/2011.  Findings: Negative visualized noncontrast brain parenchyma.  Grossly normal orbit and face soft tissues.  Visualized tympanic cavities are clear.  Visualized sphenoid sinuses are clear.  Previous maxillary antrostomies, turbinatectomies, and  ethmoidectomies. Stable mucoperiosteal thickening throughout the  maxillary and residual ethmoid sinuses.  Frontal sinuses left well visualized today but appear clear.  No acute osseous abnormality identified.  IMPRESSION:  Extensive changes of paranasal sinus surgery again noted with  chronic paranasal sinusitis, no significant change since 2012 and  no new areas of involvement.    SARCOIDOSIS She appeared to be clinically stable until this most recent bout of sinus disease.. Like to treat her for chronic sinusitis first as above. If refractory to treatment then she may need another ENT evaluation. Certain that she could have sinus involvement of her sarcoid. She will need a repeat CT scan  of her chest and pulmonary function testing but I would like to defer this until after her acute illness has been treated. We will then be better able to decide whether she needs chronic maintenance therapy for her sarcoid.   Chronic sinusitis Continue nasal saline washes, nasal steroid, and guaifenesin Start clindamycin 450 mg 3 times a day for at least the next 4 weeks until we can follow-up

## 2015-04-30 NOTE — Patient Instructions (Signed)
Start clindamycin 450 mg 3 times a day for 4 weeks (until our follow up visit) Continue fluticasone nasal spray and nasal saline washes Continue guaifenesin as you are taking it (Mucinex) Follow with Dr Lamonte Sakai in 1 month or next available.  We will discuss the timing of any repeat imaging such as a CT scan of the chest at next visit

## 2015-04-30 NOTE — Assessment & Plan Note (Signed)
She appeared to be clinically stable until this most recent bout of sinus disease.. Like to treat her for chronic sinusitis first as above. If refractory to treatment then she may need another ENT evaluation. Certain that she could have sinus involvement of her sarcoid. She will need a repeat CT scan of her chest and pulmonary function testing but I would like to defer this until after her acute illness has been treated. We will then be better able to decide whether she needs chronic maintenance therapy for her sarcoid.

## 2015-04-30 NOTE — Assessment & Plan Note (Signed)
Continue nasal saline washes, nasal steroid, and guaifenesin Start clindamycin 450 mg 3 times a day for at least the next 4 weeks until we can follow-up

## 2015-06-07 ENCOUNTER — Ambulatory Visit (INDEPENDENT_AMBULATORY_CARE_PROVIDER_SITE_OTHER): Payer: 59 | Admitting: Emergency Medicine

## 2015-06-07 ENCOUNTER — Encounter: Payer: Self-pay | Admitting: Emergency Medicine

## 2015-06-07 VITALS — BP 116/82 | HR 59 | Ht 65.5 in | Wt 156.2 lb

## 2015-06-07 DIAGNOSIS — J320 Chronic maxillary sinusitis: Secondary | ICD-10-CM | POA: Diagnosis not present

## 2015-06-07 DIAGNOSIS — Z23 Encounter for immunization: Secondary | ICD-10-CM

## 2015-06-07 DIAGNOSIS — D869 Sarcoidosis, unspecified: Secondary | ICD-10-CM | POA: Diagnosis not present

## 2015-06-07 NOTE — Assessment & Plan Note (Signed)
Stage 3-4 disease in the chest, and known sinus disease. It is unclear whether she has active sinus disease as she improved remarkably with treatment for chronic sinusitis. At this time we will defer any further imaging but if she develops a recurrent sinus infection I think she merits a repeat CT scan of the sinuses after treatment to define her anatomy. She has had sinus surgery for sarcoid and may need a referral back to ENT. I will repeat her CT scan of the chest either next August or sooner if symptoms evolve. Regular 13 and flu shot today

## 2015-06-07 NOTE — Patient Instructions (Signed)
We will not repeat your CT scan of the sinuses or chest at this time We will give the flu shot and Prevnar 13 today Follow with Dr Lamonte Sakai in 6 months or sooner if you have any problems

## 2015-06-07 NOTE — Assessment & Plan Note (Signed)
Significantly improved with 4 weeks of clindamycin. Her symptoms including her cough and chest symptoms are better. I do not believe we need to treat for sarcoidosis at this time. We may need to consider sinus imaging to look for sequela of her sarcoid or for active disease if she has a recurrent flare of sinusitis

## 2015-06-07 NOTE — Progress Notes (Signed)
[name] is a very pleasant 59 year old woman with a history of stable sarcoidosis. Last seen 11/08. She is not on maintanance meds for sarcoidosis. Since last visit has had sinus surgery for sarcoid involvement [name]). Post-op course comp by MRSA, treated for eradication. Tricor started in 8/09. Seen at [facility] regarding possible ocular sarcoidosis.   [name] 06/16/10 -- 59 yo woman, sarcoidosis. Has had recurrence of her dry cough, a tickle in throat. Frequent throat clearing. Has allergy symptoms, clear congestion. Does NSW occasionally. Not on an allergy regimen. PFT's done today, FEV1 and FVC down slightly from 2007.   [name] 07/23/10 -- Sarcoidosis, allergies/chronic cough, sinus disease (from sarcoid). Stopped lisinopril last time (2 mo ago). Tessalon Perles called in but she hasn't picked up. Continues to cough, has been doing so for a year!. She started nasal saline washes and fluticasone spray.   [name] 08/21/10 -- returns for sarcoidosis and cough. last time I treated her with prednisone for her progressive symptoms and cavitary changes on CT scan of the chest. She began to feel better on th epred - more energy, a bit less cough. She did have insomnia w the pred. Since 12/24 has had URI symptoms, lost her voice. Some relief from alka-seltzer plus, mucinex. Also on fluticasone spray and NSW two times a day.   [name] 09/16/09 -- sarcoidosis, abnormal CT scan. repeated 12/30. She has had persistent cough since last time, still w hoarse voice, globus sensation. Doing NSW's. Greenish/yellow stuff nasal drainage. Breathing is stable.   [name] 11/03/10 -- sarcoidosis, changes on CT scan. Rx for suspected chronic sinusitis w clinda 4 wks, mucous now clear, still drains. We also treated her with another round prednisone for more inflammation around UL cavity, now off. She is exercising more, cough is better.   [name] 04/14/11 -- sarcoidosis, changes on CT scan . She has had a return of her purulent  nasal drainage - went back on residual clinda that she had available. The mucous is starting to clear. She nasal saline washes daily. Takes loratadine and flonase. Her cough is more bothersome - more SOB. Denies rash.   [name] 05/08/11 -- sarcoidosis, abnormal CT scan. We repeated scan on 8/22 - shows active RML disease, apical B bullae, no nodules. She continues to feel fatigued, have dry cough. We discussed starting methotrexate given the progressive changes on Ct scan of the chest   [name] 05/26/11 -- sarcoidosis, abnormal CT scan as above. Started Pred 40mg  qd last visit.   [name] 06/24/11 -- sarcoidosis with cavitary dz on CT scan as below. Last time we decreased pred to 20mg  qd - has had more cough and drainage since that change. We discussed possibly starting MTX last time - LFT and hepatitis panel were normal last time. She wants to wait until after the holidays to start the MTX.   [name] 09/15/11 -- sarcoidosis with cavitary dz on CT scan. On chronic pred, and have been discussing possible change to MTX. She began to have sinus drainage, green,  and cough about 3 weeks ago. She has has some HA, no fevers. She is on loratadine. Had to use albuterol a few times since last time.   [name] 09/25/11 -- sarcoidosis with cavitary dz on CT scan. On chronic pred, and have been discussing possible change to MTX. Deferred last OV due to possible acute sinusitis, rx w augmentin. She is on [name], loratadine, nasal steroid. The drainage has cleared, no longer green.   [name] 10/30/11 -- sarcoidosis  with cavitary dz on CT scan. On chronic pred, last time we started MTX 7.5mg  qweek with folate qd. LFT's and CBC's have been stable/normal. Returns today for f/u. She has had a little nausea, sluggishness. Still has a dry cough, nasal gtt. NSW 2x a day, loratadine. She has occasional GERD sx. She has some chest tightness, doesn't really use SABA.  >>  Acute OV 11/19/11 Complains of pt c/o yellow prod cough and chest tightness and achy  chest x 1week worsening. Pt has used OTC Mucinex with no relief. Patient complains of sinus pain, and pressure. Over-the-counter medications are not helping at all. Cough is keeping her up at night. She is currently on methotrexate and Prednisone 20mg  daily for her sarcoidosis. She denies any hemoptysis, nausea, vomiting, diarrhea, edema, or rash.  [name] 12/03/11 -- sarcoidosis with cavitary dz on CT scan. On chronic pred, on MTX 7.5mg  qweek with folate qd since 09/25/11. Was seen 3/28 as above for productive cough, ? Sinusitis/Bronchitis, treated with with augmentin. Drainage from her head and cough are better. LFT and CBC have been normal on the MTX. She held her MTX last week of March while she was sick, then started back. She feels that the MTX is making her more lethargic, feels "heavy".   [name] 02/04/12 -- sarcoidosis with cavitary dz on CT scan. On chronic pred, on MTX qweek with folate qd since 09/25/11. Since last time treated her for sinusitis w abx, now better. Last time we decreased the pred to 10mg .   [name] 04/14/12 -- sarcoidosis with cavitary dz on CT scan. On chronic pred, on MTX 12.5 mg qweek with folate qd since 09/25/11. She has been having more sinus drainage, green phlegm, no blood, + HA. She is having associated cough.   [name] 05/23/12 -- sarcoidosis with cavitary dz on CT scan. On chronic pred, on MTX 12.5 mg qweek with folate qd since 09/25/11. Last time we started Augmentin +  NSW for sinus dz. Her sx were better whiloe she was on the augmentin, then worsened when off  [name] 07/07/12 -- sarcoidosis with cavitary dz on CT scan. She is on MTX 12.5mg  + folate, off pred since Summer 2013. She was treated for chronic sinusitis, about to finish 6 weeks. She is still having throat clearing, dry cough, chest tightness. She has nasonex, has not been using astelin. She has never benefited in the past from PPI or SABA.   [name] 07/27/12 -- sarcoidosis with cavitary dz on CT scan. She is on MTX 12.5mg  + folate, off  pred since Summer 2013. Last time checked CT scan sinuses and chest. The chest Ct scan is stable with some resolution of inflammatory changes. CT sinuses with stable chronic changes, chronic paranasal sinusitis. She continues to have globus sensation, dry cough. She has had some nose bleeding w astelin   [name] 08/04/13 -- sarcoidosis with cavitary dz on CT scan. She is on MTX 12.5mg  + folate, off pred since Summer 2013. We decreased MTX last time to 10mg . I haven't seen her for a year. She has been dealing with other problems, seeing Neuro for peripheral neuropathy, not felt to relate to sarcoidosis. She went ahead and decreased the MTX down to zero - she has been off since March. Her breathing has been stable, she still has allergies sx and drainage, cough.   [name] 08/11/13 -- sarcoidosis with cavitary dz on CT scan, now off MTX and pred. We repeated CT scan on 12/18 > no significant changes compared with  06/2012. She is feeling well, stable.   [name] 02/27/14 -- 59 yo woman that we have followed for sarcoidosis with sinus and cavitary pulm manifestations. She came off MTX in 10/2012. Repeat Ct scan done and is overall stable but quite abnormal > particularly at the B bases where there is honeycomb change and probable GG as well. She has seen neuro and rheum Amil Amen) for a peripheral neuropathy in her feet moving more proximally, associated with ankle pain and now shoulder pain.   [name] 04/18/14 -- follow up visit for stage 2-3 sarcoidosis, cavitary lesions, sinus disease.  We just did an empiric month of prednisone. Her breathing did not change - she did have side effects from the pred.  She doesn't believe the pred changed her neuropathy. She feels weaker now.    [name] 11/08/14 -- follow up visit for sarcoidosis with significant CT scan abnormalities.  Last CT scan was stable but severe disease, didn't seem to change much after empiric steroids. Since our last visit she has been rx with abx x 1, and then with  prednisone [name] FP) x 10 days for cough. She continues to have cough.  She continues her steroid nasal spray, nasal saline, loratadine. She continues to cough, non-productive usually. She still has neuropathy up to her shins. She indicates that her cough is worse after eating or when supine.   [name] 04/30/15 -- follow-up visit for sarcoidosis with significant CT scan abnormalities, last done on 04/13/14.  She has been dealing with sinus fullness and maxillary pain, having thick sinus drainage. She was evaluated w a CT scan by oral surgery, saw B maxillary sinus fluid, likely chronic sinusitis.  She is having cough, chest congestion associated with the sinus disease. Before this she was feeling well, exercising.   [name] 06/07/15 -- follow-up visit for sarcoidosis with stage 3-4 disease on CT scan of the chest as well as chronic sinus involvement. At our last visit I treated her with clindamycin for a possible chronic sinusitis.     EXAM :  Filed Vitals:   06/07/15 1503  BP: 116/82  Pulse: 59  Height: 5' 5.5" (1.664 m)  Weight: 156 lb 3.2 oz (70.852 kg)  SpO2: 99%   Gen: Pleasant, well-nourished, in no distress,  normal affect  ENT: No lesions,  mouth clear,  oropharynx clear, no postnasal drip, max sinus tenderness   Neck: No JVD, no TMG, no carotid bruits  Lungs: No use of accessory muscles, no dullness to percussion, coarse BS with no wheezing   Cardiovascular: RRR, heart sounds normal, no murmur or gallops, no peripheral edema  Musculoskeletal: No deformities, no cyanosis or clubbing  Neuro: alert, non focal  Skin: Warm, no lesions or rashes    CT scan Chest 04/13/14 --  COMPARISON: 02/12/2014.  FINDINGS:  Calcified and noncalcified mediastinal lymph nodes measure up to 1.6  cm in the lower right paratracheal station, as before. Axillary  lymph nodes measure up to 1.8 cm on the left, stable. Coronary  artery calcification. Heart is at the upper limits of normal in  size. No  pericardial effusion.  Upper lobe predominant cavitation, bronchiectasis, ground-glass and  volume loss are unchanged. Small cavity with internal debris versus  focal bronchiectasis and volume loss (image 6, series 5). Basilar  predominant bronchiectasis and honeycombing are again noted. 7 mm  nodule along the left major fissure is unchanged. No pleural fluid.  Airway is otherwise unremarkable. No air trapping.  Incidental imaging of the upper abdomen shows  the visualized  portions of the liver and adrenal glands to be grossly unremarkable.  1.3 cm low-attenuation lesion in the upper pole right kidney is  unchanged. Visualized portions of the kidneys and spleen are  otherwise unremarkable. Calcifications are seen in the pancreas. No  worrisome lytic or sclerotic lesions.  IMPRESSION:  1. Mediastinal/hilar and pulmonary parenchymal changes of sarcoid,  stable.  2. Probable focal bronchiectasis and volume loss in the apical  portion of the left upper lobe. Difficult to exclude a small  mycetoma.  3. Bibasilar fibrosis, as before, indicative of usual interstitial  pneumonitis (UIP).  4. Coronary artery calcification.  .  CT sinuses 07/13/12 --  Comparison: 04/15/2011.  Findings: Negative visualized noncontrast brain parenchyma.  Grossly normal orbit and face soft tissues.  Visualized tympanic cavities are clear.  Visualized sphenoid sinuses are clear.  Previous maxillary antrostomies, turbinatectomies, and  ethmoidectomies. Stable mucoperiosteal thickening throughout the  maxillary and residual ethmoid sinuses.  Frontal sinuses left well visualized today but appear clear.  No acute osseous abnormality identified.  IMPRESSION:  Extensive changes of paranasal sinus surgery again noted with  chronic paranasal sinusitis, no significant change since 2012 and  no new areas of involvement.    Chronic sinusitis Significantly improved with 4 weeks of clindamycin. Her symptoms including  her cough and chest symptoms are better. I do not believe we need to treat for sarcoidosis at this time. We may need to consider sinus imaging to look for sequela of her sarcoid or for active disease if she has a recurrent flare of sinusitis  SARCOIDOSIS Stage 3-4 disease in the chest, and known sinus disease. It is unclear whether she has active sinus disease as she improved remarkably with treatment for chronic sinusitis. At this time we will defer any further imaging but if she develops a recurrent sinus infection I think she merits a repeat CT scan of the sinuses after treatment to define her anatomy. She has had sinus surgery for sarcoid and may need a referral back to ENT. I will repeat her CT scan of the chest either next August or sooner if symptoms evolve. Regular 13 and flu shot today

## 2015-06-07 NOTE — Addendum Note (Signed)
Addended by: Desmond Dike C on: 06/07/2015 04:00 PM   Modules accepted: Orders

## 2015-10-12 ENCOUNTER — Other Ambulatory Visit: Payer: Self-pay | Admitting: Emergency Medicine

## 2015-11-29 ENCOUNTER — Telehealth: Payer: Self-pay | Admitting: Emergency Medicine

## 2015-11-29 NOTE — Telephone Encounter (Signed)
LM x 1 

## 2015-12-02 ENCOUNTER — Encounter: Payer: Self-pay | Admitting: Internal Medicine

## 2015-12-02 MED ORDER — LORATADINE 10 MG PO TABS: 10.0000 mg | ORAL_TABLET | Freq: Every day | ORAL | Status: DC

## 2015-12-02 NOTE — Telephone Encounter (Signed)
Pt returned call - [phone]

## 2015-12-02 NOTE — Telephone Encounter (Signed)
Spoke with pt. She needs a refill on Loratadine. This has been sent in. Nothing further was needed.

## 2015-12-02 NOTE — Telephone Encounter (Signed)
LM x 2

## 2016-01-08 ENCOUNTER — Encounter: Payer: Self-pay | Admitting: Emergency Medicine

## 2016-01-08 ENCOUNTER — Ambulatory Visit (INDEPENDENT_AMBULATORY_CARE_PROVIDER_SITE_OTHER): Payer: 59 | Admitting: Emergency Medicine

## 2016-01-08 VITALS — BP 140/92 | HR 56 | Ht 63.0 in | Wt 160.0 lb

## 2016-01-08 DIAGNOSIS — R059 Cough, unspecified: Secondary | ICD-10-CM

## 2016-01-08 DIAGNOSIS — D8686 Sarcoid arthropathy: Secondary | ICD-10-CM | POA: Insufficient documentation

## 2016-01-08 DIAGNOSIS — D869 Sarcoidosis, unspecified: Secondary | ICD-10-CM | POA: Diagnosis not present

## 2016-01-08 DIAGNOSIS — R05 Cough: Secondary | ICD-10-CM | POA: Diagnosis not present

## 2016-01-08 NOTE — Patient Instructions (Signed)
We will repeat your CT chest    Try using ibuprofen as needed for your joint pain. I suspect that the joint pain is related to her sarcoidosis. If it persists then we may need to consider treating you with a course of anti-inflammatory medicine like prednisone Please continue loratadine and fluticasone nasal spray Please stop clindamycin.  Follow with Dr Lamonte Sakai in 2 months or sooner if you have any problems.

## 2016-01-08 NOTE — Progress Notes (Signed)
[name] is a very pleasant 60 year old woman with a history of stable sarcoidosis. Last seen 11/08. She is not on maintanance meds for sarcoidosis. Since last visit has had sinus surgery for sarcoid involvement [name]). Post-op course comp by MRSA, treated for eradication. Seen at [facility] regarding possible ocular sarcoidosis.   [name] 11/08/14 -- follow up visit for sarcoidosis with significant CT scan abnormalities.  Last CT scan was stable but severe disease, didn't seem to change much after empiric steroids. Since our last visit she has been rx with abx x 1, and then with prednisone [name] FP) x 10 days for cough. She continues to have cough.  She continues her steroid nasal spray, nasal saline, loratadine. She continues to cough, non-productive usually. She still has neuropathy up to her shins. She indicates that her cough is worse after eating or when supine.   [name] 04/30/15 -- follow-up visit for sarcoidosis with significant CT scan abnormalities, last done on 04/13/14.  She has been dealing with sinus fullness and maxillary pain, having thick sinus drainage. She was evaluated w a CT scan by oral surgery, saw B maxillary sinus fluid, likely chronic sinusitis.  She is having cough, chest congestion associated with the sinus disease. Before this she was feeling well, exercising.   [name] 06/07/15 -- follow-up visit for sarcoidosis with stage 3-4 disease on CT scan of the chest as well as chronic sinus involvement. At our last visit I treated her with clindamycin for a possible chronic sinusitis.   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 She is not currently on immunosuppressive therapy. She is using loratadine and fluticasone nasal spray. Has clear nasal drainage. She has daily cough. She c/o fatigue, no energy. She is having joint pain B ankles and knees, B wrists and thumbs, neck. No  family hx RA to her knowledge.     EXAM :  Filed Vitals:   01/08/16 1534  BP: 140/92  Pulse: 56  Height: 5\' 3"  (1.6 m)  Weight: 160 lb (72.576 kg)  SpO2: 100%   Gen: Pleasant, well-nourished, in no distress,  normal affect  ENT: No lesions,  mouth clear,  oropharynx clear, no postnasal drip, max sinus tenderness   Neck: No JVD, no TMG, no carotid bruits  Lungs: No use of accessory muscles, no dullness to percussion, coarse BS with no wheezing   Cardiovascular: RRR, heart sounds normal, no murmur or gallops, no peripheral edema  Musculoskeletal: No deformities, no cyanosis or clubbing  Neuro: alert, non focal  Skin: Warm, no lesions or rashes    CT scan Chest 04/13/14 --  COMPARISON: 02/12/2014.  FINDINGS:  Calcified and noncalcified mediastinal lymph nodes measure up to 1.6  cm in the lower right paratracheal station, as before. Axillary  lymph nodes measure up to 1.8 cm on the left, stable. Coronary  artery calcification. Heart is at the upper limits of normal in  size. No pericardial effusion.  Upper lobe predominant cavitation, bronchiectasis, ground-glass and  volume loss are unchanged. Small cavity with internal debris versus  focal bronchiectasis and volume loss (image 6, series 5). Basilar  predominant bronchiectasis and honeycombing are again noted. 7 mm  nodule along the left major fissure is unchanged. No pleural fluid.  Airway is otherwise unremarkable. No air trapping.  Incidental imaging of the upper abdomen shows the visualized  portions of the liver and adrenal glands to be  grossly unremarkable.  1.3 cm low-attenuation lesion in the upper pole right kidney is  unchanged. Visualized portions of the kidneys and spleen are  otherwise unremarkable. Calcifications are seen in the pancreas. No  worrisome lytic or sclerotic lesions.  IMPRESSION:  1. Mediastinal/hilar and pulmonary parenchymal changes of sarcoid,  stable.  2. Probable focal bronchiectasis  and volume loss in the apical  portion of the left upper lobe. Difficult to exclude a small  mycetoma.  3. Bibasilar fibrosis, as before, indicative of usual interstitial  pneumonitis (UIP).  4. Coronary artery calcification.  .  CT sinuses 07/13/12 --  Comparison: 04/15/2011.  Findings: Negative visualized noncontrast brain parenchyma.  Grossly normal orbit and face soft tissues.  Visualized tympanic cavities are clear.  Visualized sphenoid sinuses are clear.  Previous maxillary antrostomies, turbinatectomies, and  ethmoidectomies. Stable mucoperiosteal thickening throughout the  maxillary and residual ethmoid sinuses.  Frontal sinuses left well visualized today but appear clear.  No acute osseous abnormality identified.  IMPRESSION:  Extensive changes of paranasal sinus surgery again noted with  chronic paranasal sinusitis, no significant change since 2012 and  no new areas of involvement.    Sarcoid arthropathy ([place]) A small Percentage of patients do get joint manifestations other sarcoidosis. She has bilateral joint complaints. She has been evaluated by rheumatology before and there was no evidence of autoimmune disease at that time. I believe that her joint complaints likely do relate to sarcoid. We will try to treat these conservatively with NSAIDs. If the problem is not self-limited then we may need to use glucocorticoids.  SARCOIDOSIS Not currently on suppressive therapy. We will plan to repeat her CT scan of the chest to compare with 2 years ago.  COUGH Continue loratadine and fluticasone nasal spray.   Baltazar Apo, MD, PhD 01/08/2016, 3:57 PM  Pulmonary and Critical Care [phone] or if no answer [phone]

## 2016-01-08 NOTE — Assessment & Plan Note (Signed)
A small Percentage of patients do get joint manifestations other sarcoidosis. She has bilateral joint complaints. She has been evaluated by rheumatology before and there was no evidence of autoimmune disease at that time. I believe that her joint complaints likely do relate to sarcoid. We will try to treat these conservatively with NSAIDs. If the problem is not self-limited then we may need to use glucocorticoids.

## 2016-01-08 NOTE — Assessment & Plan Note (Signed)
Continue loratadine and fluticasone\ nasal spray 

## 2016-01-08 NOTE — Assessment & Plan Note (Signed)
Not currently on suppressive therapy. We will plan to repeat her CT scan of the chest to compare with 2 years ago.

## 2016-01-08 NOTE — Addendum Note (Signed)
Addended by: Desmond Dike C on: 01/08/2016 03:59 PM   Modules accepted: Orders

## 2016-01-13 ENCOUNTER — Ambulatory Visit (INDEPENDENT_AMBULATORY_CARE_PROVIDER_SITE_OTHER)
Admission: RE | Admit: 2016-01-13 | Discharge: 2016-01-13 | Disposition: A | Discharge status: Home / Self Care | Payer: 59 | Source: Ambulatory Visit | Attending: Emergency Medicine | Admitting: Emergency Medicine

## 2016-01-13 DIAGNOSIS — D869 Sarcoidosis, unspecified: Secondary | ICD-10-CM

## 2016-03-10 ENCOUNTER — Ambulatory Visit: Payer: 59 | Admitting: Emergency Medicine

## 2016-04-14 ENCOUNTER — Encounter (INDEPENDENT_AMBULATORY_CARE_PROVIDER_SITE_OTHER): Payer: Self-pay

## 2016-04-14 ENCOUNTER — Ambulatory Visit (INDEPENDENT_AMBULATORY_CARE_PROVIDER_SITE_OTHER): Payer: 59 | Admitting: Emergency Medicine

## 2016-04-14 ENCOUNTER — Encounter: Payer: Self-pay | Admitting: Emergency Medicine

## 2016-04-14 VITALS — BP 128/90 | HR 66 | Ht 65.0 in | Wt 160.0 lb

## 2016-04-14 DIAGNOSIS — D8686 Sarcoid arthropathy: Secondary | ICD-10-CM | POA: Diagnosis not present

## 2016-04-14 DIAGNOSIS — D869 Sarcoidosis, unspecified: Secondary | ICD-10-CM | POA: Diagnosis not present

## 2016-04-14 DIAGNOSIS — G609 Hereditary and idiopathic neuropathy, unspecified: Secondary | ICD-10-CM

## 2016-04-14 DIAGNOSIS — M129 Arthropathy, unspecified: Secondary | ICD-10-CM | POA: Diagnosis not present

## 2016-04-14 NOTE — Progress Notes (Signed)
[name] is a very pleasant 60 year old woman with a history of stable sarcoidosis. Last seen 11/08. She is not on maintanance meds for sarcoidosis. Since last visit has had sinus surgery for sarcoid involvement [name]). Post-op course comp by MRSA, treated for eradication. Seen at [facility] regarding possible ocular sarcoidosis.   [name] 11/08/14 -- follow up visit for sarcoidosis with significant CT scan abnormalities.  Last CT scan was stable but severe disease, didn't seem to change much after empiric steroids. Since our last visit she has been rx with abx x 1, and then with prednisone [name] FP) x 10 days for cough. She continues to have cough.  She continues her steroid nasal spray, nasal saline, loratadine. She continues to cough, non-productive usually. She still has neuropathy up to her shins. She indicates that her cough is worse after eating or when supine.   [name] 04/30/15 -- follow-up visit for sarcoidosis with significant CT scan abnormalities, last done on 04/13/14.  She has been dealing with sinus fullness and maxillary pain, having thick sinus drainage. She was evaluated w a CT scan by oral surgery, saw B maxillary sinus fluid, likely chronic sinusitis.  She is having cough, chest congestion associated with the sinus disease. Before this she was feeling well, exercising.   [name] 06/07/15 -- follow-up visit for sarcoidosis with stage 3-4 disease on CT scan of the chest as well as chronic sinus involvement. At our last visit I treated her with clindamycin for a possible chronic sinusitis.   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 She is not currently on immunosuppressive therapy. She is using loratadine and fluticasone nasal spray. Has clear nasal drainage. She has daily cough. She c/o fatigue, no energy. She is having joint pain B ankles and knees, B wrists and thumbs, neck. No  family hx RA to her knowledge.   [name] 04/14/16 --  Follow-up visit for history of stage 3-4 sarcoid that has chronic sinus involvement, chronic pulmonary changes with scarring, and apparent Left upper lobe mycetoma. She has been experiencing joint pains, Has continued to do so since our last visit. Prior rheumatology evaluation was unrevealing.  B thumbs, hands. Possibly some motor involvement as well with her thumbs. She has tolerated prednisone poorly. She has been on MTX before    EXAM :  Vitals:   04/14/16 1501  BP: 128/90  BP Location: Left Arm  Cuff Size: Normal  Pulse: 66  SpO2: 98%  Weight: 160 lb (72.6 kg)  Height: 5\' 5"  (1.651 m)   Gen: Pleasant, well-nourished, in no distress,  normal affect  ENT: No lesions,  mouth clear,  oropharynx clear, no postnasal drip, max sinus tenderness   Neck: No JVD, no TMG, no carotid bruits  Lungs: No use of accessory muscles, no dullness to percussion, coarse BS with no wheezing   Cardiovascular: RRR, heart sounds normal, no murmur or gallops, no peripheral edema  Musculoskeletal: No deformities, no cyanosis or clubbing  Neuro: alert, non focal  Skin: Warm, no lesions or rashes   CT chest 01/13/16 --  COMPARISON:  Chest CT 04/14/2015.  FINDINGS: Mediastinum/Lymph Nodes: Heart size is borderline enlarged. There is no significant pericardial fluid, thickening or pericardial calcification. There is atherosclerosis of the thoracic aorta, the great vessels of the mediastinum and the coronary arteries, including calcified atherosclerotic plaque in the left anterior descending coronary artery. Multiple enlarged mediastinal and bilateral  hilar lymph nodes again noted, many of which are coarsely calcified. Lymph nodes measure up to 1.8 cm in short axis in the right paratracheal nodal station and subcarinal nodal station. Esophagus is unremarkable in appearance. Left axillary lymphadenopathy with lymph nodes measuring up to 1.5 cm in  short axis.  Lungs/Pleura: High-resolution images again demonstrate extensive subpleural reticulation, parenchymal banding, traction bronchiectasis and honeycombing throughout the lower lungs bilaterally, progressive compared to the prior study, compatible with underlying usual interstitial pneumonia. In addition, in a more patchy distribution with a mid to upper lung predominance there are extensive areas of thickening of the peribronchovascular interstitium with nodular thickening of the septa and fissures, with some associated cylindrical and varicose bronchiectasis. This results in some fibrocavitary changes in the upper lungs bilaterally, similar to the prior examination, and is compatible with the reported clinical history of sarcoidosis. Notably, within a pre-existing cavity in the medial aspect of the left upper lobe near the apex there is increasing soft tissue on today's examination, measuring up to 1.6 x 1.8 cm (image 19 of series 5), compatible with a mycetoma. Inspiratory and expiratory imaging demonstrates some mild air trapping, indicative of mild small airways disease. No acute consolidative airspace disease. No pleural effusions.  Upper abdomen: Calcifications in the body of the pancreas are similar to the prior study, likely sequela of prior episodes of pancreatitis. 1.7 cm low-attenuation lesion in the upper pole of the right kidney is incompletely characterized on today's noncontrast CT examination, but is similar to the prior study, likely a cyst.  Musculoskeletal: There are no aggressive appearing lytic or blastic lesions noted in the visualized portions of the skeleton.  IMPRESSION: 1. Previously suspected mycetoma in the medial aspect of the left upper lobe is now clearly evident, in currently measures 1.6 x 1.8 cm (image 19 of series 5). 2. Chronic findings in the thorax compatible with the reported clinical history of sarcoidosis, including  mediastinal and bilateral hilar lymphadenopathy, as well as chronic lung findings, as above. 3. In addition, there is progressive interstitial lung disease most evident throughout the lung bases which is considered diagnostic from an imaging standpoint of the usual interstitial pneumonia (UIP). 4. Atherosclerosis, including left anterior descending coronary artery disease. Please note that although the presence of coronary artery calcium documents the presence of coronary artery disease, the severity of this disease and any potential stenosis cannot be assessed on this non-gated CT examination. Assessment for potential risk factor modification, dietary therapy or pharmacologic therapy may be warranted, if clinically indicated.   .  CT sinuses 07/13/12 --  Comparison: 04/15/2011.  Findings: Negative visualized noncontrast brain parenchyma.  Grossly normal orbit and face soft tissues.  Visualized tympanic cavities are clear.  Visualized sphenoid sinuses are clear.  Previous maxillary antrostomies, turbinatectomies, and  ethmoidectomies. Stable mucoperiosteal thickening throughout the  maxillary and residual ethmoid sinuses.  Frontal sinuses left well visualized today but appear clear.  No acute osseous abnormality identified.  IMPRESSION:  Extensive changes of paranasal sinus surgery again noted with  chronic paranasal sinusitis, no significant change since 2012 and  no new areas of involvement.    SARCOIDOSIS Document of sarcoidosis with clear pulmonary manifestations. I believe her CT scan shows more basilar scar than her priors. She probably merits anti-inflammatory medication although the presence of a mycetoma may complicate this is a posterior risk for progression of fungal disease. Also I'd like to defer if possible until her repeat neurology and rheumatology evaluations can happen.  Sarcoid arthropathy ([place])  Unclear to me whether this is truly sarcoid arthropathy as this  is a rare manifestation of the disease. Her rheumatoid factor has been negative in the past. I will recheck it today. I'll like for her to see Dr. Amil Amen again to reevaluate her joint complaints as they now involve her knees, hands, wrists and her thumbs.  Hereditary and idiopathic peripheral neuropathy I still question whether this may be neuro sarcoid. She started to have some motor changes in her hands. She has peripheral neuropathy as well which Would be less consistent but which is still puzzling. She's been evaluated by neurology and has been some question performing nerve biopsy or nerve conduction studies. I would like for her to have a second opinion and will refer her today.   Baltazar Apo, MD, PhD 04/14/2016, 3:33 PM [facility] [phone] or if no answer [phone]

## 2016-04-14 NOTE — Patient Instructions (Addendum)
We will refer you to Neurology for a second opinion to assess for possible neuro-sarcoid effects.  We will refer you to see Dr Amil Amen regarding potential sarcoidosis involvement in joints versus another inflammatory disease.  We will check lab work today to send to Dr Amil Amen.  We will hold off on prednisone for now, although we may need to do so as we go forward.  Follow with Dr Lamonte Sakai in 2 months or sooner if you have any problems.

## 2016-04-14 NOTE — Assessment & Plan Note (Signed)
Unclear to me whether this is truly sarcoid arthropathy as this is a rare manifestation of the disease. Her rheumatoid factor has been negative in the past. I will recheck it today. I'll like for her to see Dr. Amil Amen again to reevaluate her joint complaints as they now involve her knees, hands, wrists and her thumbs.

## 2016-04-14 NOTE — Assessment & Plan Note (Signed)
I still question whether this may be neuro sarcoid. She started to have some motor changes in her hands. She has peripheral neuropathy as well which Would be less consistent but which is still puzzling. She's been evaluated by neurology and has been some question performing nerve biopsy or nerve conduction studies. I would like for her to have a second opinion and will refer her today.

## 2016-04-14 NOTE — Assessment & Plan Note (Signed)
Document of sarcoidosis with clear pulmonary manifestations. I believe her CT scan shows more basilar scar than her priors. She probably merits anti-inflammatory medication although the presence of a mycetoma may complicate this is a posterior risk for progression of fungal disease. Also I'd like to defer if possible until her repeat neurology and rheumatology evaluations can happen.

## 2016-04-23 ENCOUNTER — Other Ambulatory Visit: Payer: Self-pay | Admitting: Emergency Medicine

## 2016-05-05 ENCOUNTER — Other Ambulatory Visit: Payer: 59

## 2016-05-05 DIAGNOSIS — M129 Arthropathy, unspecified: Secondary | ICD-10-CM

## 2016-05-06 LAB — RHEUMATOID FACTOR: Rhuematoid fact SerPl-aCnc: <14 IU/mL (ref <14)

## 2016-06-05 ENCOUNTER — Ambulatory Visit (INDEPENDENT_AMBULATORY_CARE_PROVIDER_SITE_OTHER): Payer: 59

## 2016-06-05 DIAGNOSIS — Z23 Encounter for immunization: Secondary | ICD-10-CM

## 2016-06-22 ENCOUNTER — Ambulatory Visit (INDEPENDENT_AMBULATORY_CARE_PROVIDER_SITE_OTHER): Payer: 59 | Admitting: Neurology

## 2016-06-22 ENCOUNTER — Encounter: Payer: Self-pay | Admitting: Emergency Medicine

## 2016-06-22 ENCOUNTER — Encounter: Payer: Self-pay | Admitting: Neurology

## 2016-06-22 ENCOUNTER — Ambulatory Visit (INDEPENDENT_AMBULATORY_CARE_PROVIDER_SITE_OTHER): Payer: 59 | Admitting: Emergency Medicine

## 2016-06-22 ENCOUNTER — Ambulatory Visit: Payer: 59 | Admitting: Neurology

## 2016-06-22 VITALS — BP 146/82 | HR 57 | Ht 65.0 in | Wt 165.1 lb

## 2016-06-22 VITALS — BP 136/80 | HR 60 | Ht 65.0 in | Wt 164.0 lb

## 2016-06-22 DIAGNOSIS — D860 Sarcoidosis of lung: Secondary | ICD-10-CM

## 2016-06-22 DIAGNOSIS — B479 Mycetoma, unspecified: Secondary | ICD-10-CM | POA: Diagnosis not present

## 2016-06-22 DIAGNOSIS — R202 Paresthesia of skin: Secondary | ICD-10-CM

## 2016-06-22 DIAGNOSIS — D869 Sarcoidosis, unspecified: Secondary | ICD-10-CM

## 2016-06-22 NOTE — Assessment & Plan Note (Signed)
Suspected mycetoma based on CT scans of the chest.. We are following with serial scans, may need to consider bronchoscopy for fungal cultures. Certainly if she is being considered for him as suppressive therapy we should do this. CT scan of the chest is due for May 2018

## 2016-06-22 NOTE — Progress Notes (Signed)
[name] is a very pleasant 60 year old woman with a history of stable sarcoidosis. Last seen 11/08. She is not on maintanance meds for sarcoidosis. Since last visit has had sinus surgery for sarcoid involvement [name]). Post-op course comp by MRSA, treated for eradication. Seen at [facility] regarding possible ocular sarcoidosis.   [name] 11/08/14 -- follow up visit for sarcoidosis with significant CT scan abnormalities.  Last CT scan was stable but severe disease, didn't seem to change much after empiric steroids. Since our last visit she has been rx with abx x 1, and then with prednisone [name] FP) x 10 days for cough. She continues to have cough.  She continues her steroid nasal spray, nasal saline, loratadine. She continues to cough, non-productive usually. She still has neuropathy up to her shins. She indicates that her cough is worse after eating or when supine.   [name] 04/30/15 -- follow-up visit for sarcoidosis with significant CT scan abnormalities, last done on 04/13/14.  She has been dealing with sinus fullness and maxillary pain, having thick sinus drainage. She was evaluated w a CT scan by oral surgery, saw B maxillary sinus fluid, likely chronic sinusitis.  She is having cough, chest congestion associated with the sinus disease. Before this she was feeling well, exercising.   [name] 06/07/15 -- follow-up visit for sarcoidosis with stage 3-4 disease on CT scan of the chest as well as chronic sinus involvement. At our last visit I treated her with clindamycin for a possible chronic sinusitis.   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 She is not currently on immunosuppressive therapy. She is using loratadine and fluticasone nasal spray. Has clear nasal drainage. She has daily cough. She c/o fatigue, no energy. She is having joint pain B ankles and knees, B wrists and thumbs, neck. No  family hx RA to her knowledge.   [name] 04/14/16 --  Follow-up visit for history of stage 3-4 sarcoid that has chronic sinus involvement, chronic pulmonary changes with scarring, and apparent Left upper lobe mycetoma. She has been experiencing joint pains, Has continued to do so since our last visit. Prior rheumatology evaluation was unrevealing.  B thumbs, hands. Possibly some motor involvement as well with her thumbs. She has tolerated prednisone poorly. She has been on MTX before  [name] 06/22/16 -- [name] follows up for her history of pulmonary sarcoidosis, stage 3-4, with chronic sinus involvement. She has an apparent left upper lobe mycetoma. I have been hesitant to restart her on image suppression therapy given the possibility of left upper lobe fungal involvement. I asked her to see Rheumatology again for arthralgias, neurology for a 2nd opinion regarding possible neuro - sarcoid.  She was seen by Rheum, still working on eval for possible sarcoid arthropathy. She saw neurology this am - unclear that this is neuro sarcoid. Her breathing is doing well.    EXAM :  Vitals:   06/22/16 1619  BP: 136/80  BP Location: Left Arm  Cuff Size: Normal  Pulse: 60  SpO2: 96%  Weight: 164 lb (74.4 kg)  Height: 5\' 5"  (1.651 m)   Gen: Pleasant, well-nourished, in no distress,  normal affect  ENT: No lesions,  mouth clear,  oropharynx clear, no postnasal drip, max sinus tenderness   Neck: No JVD, no TMG, no carotid bruits  Lungs: No use of accessory muscles, no dullness to percussion, coarse BS with no wheezing  Cardiovascular: RRR, heart sounds normal, no murmur or gallops, no peripheral edema  Musculoskeletal: No deformities, no cyanosis or clubbing  Neuro: alert, non focal  Skin: Warm, no lesions or rashes   CT chest 01/13/16 --  COMPARISON:  Chest CT 04/14/2015.  FINDINGS: Mediastinum/Lymph Nodes: Heart size is borderline enlarged. There is no significant pericardial fluid, thickening or  pericardial calcification. There is atherosclerosis of the thoracic aorta, the great vessels of the mediastinum and the coronary arteries, including calcified atherosclerotic plaque in the left anterior descending coronary artery. Multiple enlarged mediastinal and bilateral hilar lymph nodes again noted, many of which are coarsely calcified. Lymph nodes measure up to 1.8 cm in short axis in the right paratracheal nodal station and subcarinal nodal station. Esophagus is unremarkable in appearance. Left axillary lymphadenopathy with lymph nodes measuring up to 1.5 cm in short axis.  Lungs/Pleura: High-resolution images again demonstrate extensive subpleural reticulation, parenchymal banding, traction bronchiectasis and honeycombing throughout the lower lungs bilaterally, progressive compared to the prior study, compatible with underlying usual interstitial pneumonia. In addition, in a more patchy distribution with a mid to upper lung predominance there are extensive areas of thickening of the peribronchovascular interstitium with nodular thickening of the septa and fissures, with some associated cylindrical and varicose bronchiectasis. This results in some fibrocavitary changes in the upper lungs bilaterally, similar to the prior examination, and is compatible with the reported clinical history of sarcoidosis. Notably, within a pre-existing cavity in the medial aspect of the left upper lobe near the apex there is increasing soft tissue on today's examination, measuring up to 1.6 x 1.8 cm (image 19 of series 5), compatible with a mycetoma. Inspiratory and expiratory imaging demonstrates some mild air trapping, indicative of mild small airways disease. No acute consolidative airspace disease. No pleural effusions.  Upper abdomen: Calcifications in the body of the pancreas are similar to the prior study, likely sequela of prior episodes of pancreatitis. 1.7 cm low-attenuation lesion in  the upper pole of the right kidney is incompletely characterized on today's noncontrast CT examination, but is similar to the prior study, likely a cyst.  Musculoskeletal: There are no aggressive appearing lytic or blastic lesions noted in the visualized portions of the skeleton.  IMPRESSION: 1. Previously suspected mycetoma in the medial aspect of the left upper lobe is now clearly evident, in currently measures 1.6 x 1.8 cm (image 19 of series 5). 2. Chronic findings in the thorax compatible with the reported clinical history of sarcoidosis, including mediastinal and bilateral hilar lymphadenopathy, as well as chronic lung findings, as above. 3. In addition, there is progressive interstitial lung disease most evident throughout the lung bases which is considered diagnostic from an imaging standpoint of the usual interstitial pneumonia (UIP). 4. Atherosclerosis, including left anterior descending coronary artery disease. Please note that although the presence of coronary artery calcium documents the presence of coronary artery disease, the severity of this disease and any potential stenosis cannot be assessed on this non-gated CT examination. Assessment for potential risk factor modification, dietary therapy or pharmacologic therapy may be warranted, if clinically indicated.   .  CT sinuses 07/13/12 --  Comparison: 04/15/2011.  Findings: Negative visualized noncontrast brain parenchyma.  Grossly normal orbit and face soft tissues.  Visualized tympanic cavities are clear.  Visualized sphenoid sinuses are clear.  Previous maxillary antrostomies, turbinatectomies, and  ethmoidectomies. Stable mucoperiosteal thickening throughout the  maxillary and residual ethmoid sinuses.  Frontal sinuses left well visualized today but appear clear.  No acute osseous  abnormality identified.  IMPRESSION:  Extensive changes of paranasal sinus surgery again noted with  chronic paranasal  sinusitis, no significant change since 2012 and  no new areas of involvement.    SARCOIDOSIS Significant pulmonary disease, likely stage 4, and is now under evaluation for possible arthropathy and neuropathy from sarcoidosis. her respiratory symptoms are not significant at this time. Does have a suspected mycetoma and a cavity in the left upper lobe. This will need to be followed with serial imaging and we will need to take this area into account if she should require immunosuppressive therapy for any reason.  Mycetoma Suspected mycetoma based on CT scans of the chest.. We are following with serial scans, may need to consider bronchoscopy for fungal cultures. Certainly if she is being considered for him as suppressive therapy we should do this. CT scan of the chest is due for May 2018  Baltazar Apo, MD, PhD 06/22/2016, 4:59 PM [facility] [phone] or if no answer [phone]

## 2016-06-22 NOTE — Patient Instructions (Signed)
Please continue to follow with Neurology and Rheumatology as planned.  We will repeat your CT scan of the chest in May 2018 to compare with priors.  If we determine that there is a reason to start immunosuppressive therapy then we will need to consider and follow your CT scan of the chest and suspected left lung fungal disease.  Follow with Dr Lamonte Sakai in May 2018.

## 2016-06-22 NOTE — Assessment & Plan Note (Signed)
Significant pulmonary disease, likely stage 4, and is now under evaluation for possible arthropathy and neuropathy from sarcoidosis. her respiratory symptoms are not significant at this time. Does have a suspected mycetoma and a cavity in the left upper lobe. This will need to be followed with serial imaging and we will need to take this area into account if she should require immunosuppressive therapy for any reason.

## 2016-06-22 NOTE — Progress Notes (Signed)
[facility] Clinic Note - Initial Visit   Date: 06/22/16  [name] MRN: [id number] DOB: 12-27-1955   Dear Dr. Lamonte Sakai:  Thank you for your kind referral of [name] for consultation of neuropathy. Although her history is well known to you, please allow [place] to reiterate it for the purpose of our medical record. The patient was accompanied to the clinic by self    History of Present Illness: [name] is a 60 y.o. right-handed African American female with sarcoidosis (1997), hypertension, hyperlipidemia presenting for evaluation of numbness of the legs.    Starting around 2014, she began noticing numbness over the toes and over the past few years, it has progressed up her ankles, knee, and now it is at the level of the mid-thigh.  The numbness is inside and does not seem to involve the skin surface. She has the sensation of numbness, but if she touches her legs, she can detect stimuli.  It does not feel like her legs are asleep.  She has some pain and tingling over the left lateral lower leg which is worse at night.  She takes gabapentin 300mg  at bedtime, but takes this only as needed due to sedation and dizziness.  She cannot tell whether this helps because it makes her too sleepy.    She does not have any weakness.  She may stumbles at times, but has not suffered any falls.   She also has a lot of joint pain and has been seeing rheumatology.  Rheumatoid factor is negative.  The possibility of sarcoid arthropathy has been raised.   She has been previously treated with methotrexate and prednisone, but has been off medications for some time.  She saw Dr. Rexene Alberts and Jaynee Eagles in 2015 for the same complaints and underwent MRI brain, MRI lumbar spine, and NCS/EMG of the legs which was normal.   Out-side paper records, electronic medical record, and images have been reviewed where available and summarized as:  Lab Results  Component Value Date   TSH 0.927  05/17/2014   Lab Results  Component Value Date   VITAMINB12 1,028 (H) 05/17/2014   MRI brain wwo contrast 05/31/2013:   Equivocal MRI brain (with and without) demonstrating: 1. Few punctate foci of non-specific gliosis. 2. No acute findings.  MRI lumbar spine wo contrast 05/31/2013:  Unremarkable MRI lumbar spine (without). No spinal stenosis or foraminal narrowing.  Past Medical History:  Diagnosis Date  . Chronic sinusitis   . Cough   . Diabetes mellitus without complication ([place])   . Hypertension   . Sarcoidosis [facility])     Past Surgical History:  Procedure Laterality Date  . LYMPHADENECTOMY    . NOSE SURGERY    . TUBAL LIGATION    . VESICOVAGINAL FISTULA CLOSURE W/ TAH       Medications:  Outpatient Encounter Prescriptions as of 06/22/2016  Medication Sig  . aspirin 81 MG tablet Take 81 mg by mouth daily.  Marland Kitchen aspirin-acetaminophen-caffeine (EXCEDRIN MIGRAINE) 250-250-65 MG per tablet Take 1 tablet by mouth every 6 (six) hours as needed for pain.  Marland Kitchen atorvastatin (LIPITOR) 20 MG tablet Take 20 mg by mouth daily.  . Cholecalciferol (VITAMIN D-3) 5000 UNITS TABS Take 5,000 Units by mouth daily.  . ergocalciferol (VITAMIN D2) 50000 UNITS capsule Take 50,000 Units by mouth once a week.  . fenofibrate 160 MG tablet Take 160 mg by mouth daily.  . fish oil-omega-3 fatty acids 1000 MG capsule Take 2 g  by mouth daily.  . fluticasone (FLONASE) 50 MCG/ACT nasal spray Use 1 spray into both  nostrils daily as needed  . guaiFENesin (MUCINEX) 600 MG 12 hr tablet Take by mouth 2 (two) times daily.  . Ibuprofen (MOTRIN PO) Take 200 mg by mouth as needed.  . loratadine (CLARITIN) 10 MG tablet Take 1 tablet (10 mg total) by mouth daily.  Marland Kitchen losartan-hydrochlorothiazide (HYZAAR) 50-12.5 MG per tablet Take 1 tablet by mouth daily.  . Multiple Vitamin (MULTIVITAMIN) capsule Take 1 capsule by mouth daily.    . Naproxen Sod-Diphenhydramine (ALEVE PM PO) Take 1 tablet by mouth at bedtime as needed  and may repeat dose one time if needed.  . niacin (NIASPAN) 1000 MG CR tablet Take 1,000 mg by mouth at bedtime.  . clindamycin (CLEOCIN) 150 MG capsule Take 3 capsules (450 mg total) by mouth 3 (three) times daily. (Patient not taking: Reported on 06/22/2016)  . esomeprazole (NEXIUM) 40 MG capsule Take 1 capsule twice a day for 1 week, then take 1 capsule daily (Patient not taking: Reported on 06/22/2016)  . gabapentin (NEURONTIN) 100 MG capsule Take 300 mg by mouth daily as needed.   No facility-administered encounter medications on file as of 06/22/2016.      Allergies: No Known Allergies  Family History: Family History  Problem Relation Age of Onset  . Stroke Father   . Heart murmur Father   . Diabetes Father   . Emphysema Mother   . COPD Mother   . Breast cancer Sister     Social History: Social History  Substance Use Topics  . Smoking status: Never Smoker  . Smokeless tobacco: Never Used  . Alcohol use No   Social History   Social History Narrative   Patient lives at home alone. She works as Artist level of education:  College    Caffeine use: 4-5 cups daily    Review of Systems:  CONSTITUTIONAL: No fevers, chills, night sweats, or weight loss.   EYES: No visual changes or eye pain ENT: No hearing changes.  No history of nose bleeds.   RESPIRATORY: No cough, wheezing and shortness of breath.   CARDIOVASCULAR: Negative for chest pain, and palpitations.   GI: Negative for abdominal discomfort, blood in stools or black stools.  No recent change in bowel habits.   GU:  No history of incontinence.   MUSCLOSKELETAL: +history of joint pain or swelling.  No myalgias.   SKIN: Negative for lesions, rash, and itching.   HEMATOLOGY/ONCOLOGY: Negative for prolonged bleeding, bruising easily, and swollen nodes.  No history of cancer.   ENDOCRINE: Negative for cold or heat intolerance, polydipsia or goiter.   PSYCH:  No depression or  anxiety symptoms.   NEURO: As Above.   Vital Signs:  BP (!) 146/82   Pulse (!) 57   Ht 5\' 5"  (1.651 m)   Wt 165 lb 1 oz (74.9 kg)   SpO2 100%   BMI 27.47 kg/m    General Medical Exam:   General:  Well appearing, comfortable.   Eyes/ENT: see cranial nerve examination.   Neck: No masses appreciated.  Full range of motion without tenderness.  No carotid bruits. Respiratory:  Clear to auscultation, good air entry bilaterally.   Cardiac:  Regular rate and rhythm, no murmur.   Extremities:  No deformities, edema, or skin discoloration.  Skin:  No rashes or lesions.  Neurological Exam: MENTAL STATUS including orientation to time, place, person, recent  and remote memory, attention span and concentration, language, and fund of knowledge is normal.  Speech is not dysarthric.  CRANIAL NERVES: II:  No visual field defects.  Unremarkable fundi.   III-IV-VI: Pupils equal round and reactive to light.  Normal conjugate, extra-ocular eye movements in all directions of gaze.  No nystagmus.  No ptosis.   V:  Normal facial sensation.  VII:  Normal facial symmetry and movements.  No pathologic facial reflexes.  VIII:  Normal hearing and vestibular function.   IX-X:  Normal palatal movement.   XI:  Normal shoulder shrug and head rotation.   XII:  Normal tongue strength and range of motion, no deviation or fasciculation.  MOTOR:  No atrophy, fasciculations or abnormal movements.  No pronator drift.  Tone is normal.    Right Upper Extremity:    Left Upper Extremity:    Deltoid  5/5   Deltoid  5/5   Biceps  5/5   Biceps  5/5   Triceps  5/5   Triceps  5/5   Wrist extensors  5/5   Wrist extensors  5/5   Wrist flexors  5/5   Wrist flexors  5/5   Finger extensors  5/5   Finger extensors  5/5   Finger flexors  5/5   Finger flexors  5/5   Dorsal interossei  5/5   Dorsal interossei  5/5   Abductor pollicis  5/5   Abductor pollicis  5/5   Tone (Ashworth scale)  0  Tone (Ashworth scale)  0   Right  Lower Extremity:    Left Lower Extremity:    Hip flexors  5/5   Hip flexors  5/5   Hip extensors  5/5   Hip extensors  5/5   Knee flexors  5/5   Knee flexors  5/5   Knee extensors  5/5   Knee extensors  5/5   Dorsiflexors  5/5   Dorsiflexors  5/5   Plantarflexors  5/5   Plantarflexors  5/5   Toe extensors  5/5   Toe extensors  5/5   Toe flexors  5/5   Toe flexors  5/5   Tone (Ashworth scale)  0  Tone (Ashworth scale)  0   MSRs:  Right                                                                 Left brachioradialis 1+  brachioradialis 1+  biceps 1+  biceps 1+  triceps 1+  triceps 1+  patellar 1+  patellar 1+  ankle jerk 1+  ankle jerk 1+  Hoffman no  Hoffman no  plantar response down  plantar response down   SENSORY: Vibration is reduced to 80% at the ankles, 60% at the great toe bilaterally.  Pin prick, temperature, and light touch is intact.  Romberg's sign absent.   COORDINATION/GAIT: Normal finger-to- nose-finger.  Intact rapid alternating movements bilaterally.  Able to rise from a chair without using arms.  Gait narrow based and stable. Tandem and stressed gait intact.    IMPRESSION: Ms. Gruis is a 60 year-old female with history of pulmonary sarcoidosis referred for evaluation of bilateral leg numbness, up to the level of her mid-thigh.  Her neurological exam shows mildly reduced vibration distally at the  feet, but no other sensory abnormalities.  There is no sensory ataxia, weakness, or gradient loss of reflexes.  It is highly unusual for neuropathy to involve symptoms to the level of mid-thigh and have only mild sensory changes on her exam, therefore, my suspicion for primary neuropathy causing her sensory changes is low.  She also admits that she has the sensation of legs feeling numb, but can detect tactile stimuli well.  I will repeat her NCS/EMG of the legs to see if there is evidence of large fiber neuropathy.  If this returns normal, she may benefit from arterial studies  of the legs.  We also discussed skin biopsy for small fiber neuropathy, but this manifests with burning, tingling pain which she denies.    PLAN/RECOMMENDATIONS:  1.  NCS/EMG of the legs 2.  Consider arterial studies going forward  Further recommendations will be based on the results of the above testing   The duration of this appointment visit was 45 minutes of face-to-face time with the patient.  Greater than 50% of this time was spent in counseling, explanation of diagnosis, planning of further management, and coordination of care.   Thank you for allowing me to participate in patient's care.  If I can answer any additional questions, I would be pleased to do so.    Sincerely,    Sidharth Leverette K. Posey Pronto, DO

## 2016-07-02 ENCOUNTER — Ambulatory Visit (INDEPENDENT_AMBULATORY_CARE_PROVIDER_SITE_OTHER): Payer: 59 | Admitting: Neurology

## 2016-07-02 DIAGNOSIS — R202 Paresthesia of skin: Secondary | ICD-10-CM

## 2016-07-02 NOTE — Procedures (Signed)
[facility]  [place], [address]  [place], [place] [address] Tel: [phone] Fax:  [phone] Test Date:  07/02/2016  Patient: [name] DOB: 1955/10/07 Physician: Narda Amber, DO  Sex: Female Height: 5\' 5"  Ref Phys: Narda Amber, DO  ID#: [id number] Temp: 34.9C Technician: Jerilynn Mages. Dean   Patient Complaints: This is a 60 year-old female with history of sarcoidosis referred for evaluation of bilateral feet paresthesias.   NCV & EMG Findings: Extensive electrodiagnostic testing of the right lower extremity and additional studies of the left shows:  1. Bilateral sural and superficial peroneal sensory responses are within normal limits. 2. Bilateral peroneal and tibial motor responses are within normal limits. 3. Bilateral tibial H reflex studies are within normal limits. 4. There is no evidence of active or chronic motor axon loss changes affecting any of the tested muscles. Motor unit configuration and recruitment pattern is within normal limits.  Impression: This is a normal study of the lower extremities. In particular, there is no evidence of a generalized sensorimotor polyneuropathy or lumbosacral radiculopathy.   ___________________________ Narda Amber, DO    Nerve Conduction Studies Anti Sensory Summary Table   Stim Site NR Peak (ms) Norm Peak (ms) P-T Amp (V) Norm P-T Amp  Left Sup Peroneal Anti Sensory (Ant Lat Mall)  34.9C  12 cm    4.0 <4.6 6.5 >4  Right Sup Peroneal Anti Sensory (Ant Lat Mall)  34.9C  12 cm    3.2 <4.6 10.0 >4  Left Sural Anti Sensory (Lat Mall)  34.9C  Calf    3.3 <4.6 12.0 >4  Right Sural Anti Sensory (Lat Mall)  34.9C  Calf    3.5 <4.6 8.2 >4   Motor Summary Table   Stim Site NR Onset (ms) Norm Onset (ms) O-P Amp (mV) Norm O-P Amp Site1 Site2 Delta-0 (ms) Dist (cm) Vel (m/s) Norm Vel (m/s)  Left Peroneal Motor (Ext Dig Brev)  34.9C  Ankle    3.8 <6.0 4.5 >2.5 B Fib Ankle 7.3 34.0 47 >40  B Fib    11.1  4.4  Poplt  B Fib 1.7 10.0 59 >40  Poplt    12.8  3.5         Right Peroneal Motor (Ext Dig Brev)  34.9C  Ankle    3.4 <6.0 4.5 >2.5 B Fib Ankle 6.8 34.0 50 >40  B Fib    10.2  4.5  Poplt B Fib 1.8 10.0 56 >40  Poplt    12.0  4.3         Left Tibial Motor (Abd Hall Brev)  34.9C  Ankle    4.2 <6.0 10.5 >4 Knee Ankle 9.2 39.0 42 >40  Knee    13.4  6.1         Right Tibial Motor (Abd Hall Brev)  34.9C  Ankle    4.5 <6.0 11.5 >4 Knee Ankle 7.8 38.0 49 >40  Knee    12.3  8.9          H Reflex Studies   NR H-Lat (ms) Lat Norm (ms) L-R H-Lat (ms) M-Lat (ms) HLat-MLat (ms)  Left Tibial (Gastroc)  34.9C     32.79 <35 0.41 3.81 28.98  Right Tibial (Gastroc)  34.9C     32.38 <35 0.41 3.81 28.57   EMG   Side Muscle Ins Act Fibs Psw Fasc Number Recrt Dur Dur. Amp Amp. Poly Poly. Comment  Right AntTibialis Nml Nml Nml Nml Nml Nml Nml Nml  Nml Nml Nml Nml N/A  Right Gastroc Nml Nml Nml Nml Nml Nml Nml Nml Nml Nml Nml Nml N/A  Right Flex Dig Long Nml Nml Nml Nml Nml Nml Nml Nml Nml Nml Nml Nml N/A  Right RectFemoris Nml Nml Nml Nml Nml Nml Nml Nml Nml Nml Nml Nml N/A  Right GluteusMed Nml Nml Nml Nml Nml Nml Nml Nml Nml Nml Nml Nml N/A  Right BicepsFemS Nml Nml Nml Nml Nml Nml Nml Nml Nml Nml Nml Nml N/A  Left BicepsFemS Nml Nml Nml Nml Nml Nml Nml Nml Nml Nml Nml Nml N/A  Left AntTibialis Nml Nml Nml Nml Nml Nml Nml Nml Nml Nml Nml Nml N/A  Left Gastroc Nml Nml Nml Nml Nml Nml Nml Nml Nml Nml Nml Nml N/A  Left Flex Dig Long Nml Nml Nml Nml Nml Nml Nml Nml Nml Nml Nml Nml N/A  Left RectFemoris Nml Nml Nml Nml Nml Nml Nml Nml Nml Nml Nml Nml N/A  Left GluteusMed Nml Nml Nml Nml Nml Nml Nml Nml Nml Nml Nml Nml N/A      Waveforms:

## 2016-07-14 ENCOUNTER — Other Ambulatory Visit: Payer: Self-pay | Admitting: *Deleted

## 2016-07-14 DIAGNOSIS — R202 Paresthesia of skin: Secondary | ICD-10-CM

## 2016-07-24 ENCOUNTER — Telehealth: Payer: Self-pay | Admitting: *Deleted

## 2016-07-24 ENCOUNTER — Encounter: Payer: Self-pay | Admitting: *Deleted

## 2016-07-24 NOTE — Telephone Encounter (Signed)
Called patient x 2 to let her know about her cardiology appointment.  Left 2 messages and sent a letter.

## 2016-08-05 ENCOUNTER — Other Ambulatory Visit: Payer: Self-pay | Admitting: Neurology

## 2016-08-05 DIAGNOSIS — R202 Paresthesia of skin: Secondary | ICD-10-CM

## 2016-08-07 ENCOUNTER — Emergency Department (HOSPITAL_COMMUNITY): Payer: 59

## 2016-08-07 ENCOUNTER — Encounter (HOSPITAL_COMMUNITY): Payer: Self-pay

## 2016-08-07 ENCOUNTER — Emergency Department (HOSPITAL_COMMUNITY)
Admission: EM | Admit: 2016-08-07 | Discharge: 2016-08-07 | Disposition: A | Discharge status: Home / Self Care | Payer: 59 | Attending: Emergency Medicine | Admitting: Emergency Medicine

## 2016-08-07 ENCOUNTER — Telehealth: Payer: Self-pay | Admitting: Emergency Medicine

## 2016-08-07 DIAGNOSIS — J181 Lobar pneumonia, unspecified organism: Secondary | ICD-10-CM | POA: Diagnosis not present

## 2016-08-07 DIAGNOSIS — R091 Pleurisy: Secondary | ICD-10-CM | POA: Insufficient documentation

## 2016-08-07 DIAGNOSIS — I10 Essential (primary) hypertension: Secondary | ICD-10-CM | POA: Insufficient documentation

## 2016-08-07 DIAGNOSIS — E119 Type 2 diabetes mellitus without complications: Secondary | ICD-10-CM | POA: Insufficient documentation

## 2016-08-07 DIAGNOSIS — Z7982 Long term (current) use of aspirin: Secondary | ICD-10-CM | POA: Insufficient documentation

## 2016-08-07 DIAGNOSIS — J189 Pneumonia, unspecified organism: Secondary | ICD-10-CM

## 2016-08-07 DIAGNOSIS — R071 Chest pain on breathing: Secondary | ICD-10-CM | POA: Diagnosis present

## 2016-08-07 LAB — BASIC METABOLIC PANEL
Anion gap: 10 (ref 5–15)
BUN: 16 mg/dL (ref 6–20)
CHLORIDE: 104 mmol/L (ref 101–111)
CO2: 24 mmol/L (ref 22–32)
CREATININE: 1.01 mg/dL — AB (ref 0.44–1.00)
Calcium: 9.9 mg/dL (ref 8.9–10.3)
GFR calc non Af Amer: >60 mL/min (ref >60)
Glucose, Bld: 110 mg/dL — ABNORMAL HIGH (ref 65–99)
POTASSIUM: 3.6 mmol/L (ref 3.5–5.1)
Sodium: 138 mmol/L (ref 135–145)

## 2016-08-07 LAB — CBC
HCT: 38.4 % (ref 36.0–46.0)
Hemoglobin: 12.5 g/dL (ref 12.0–15.0)
MCH: 27.7 pg (ref 26.0–34.0)
MCHC: 32.6 g/dL (ref 30.0–36.0)
MCV: 85.1 fL (ref 78.0–100.0)
PLATELETS: 236 10*3/uL (ref 150–400)
RBC: 4.51 MIL/uL (ref 3.87–5.11)
RDW: 13.7 % (ref 11.5–15.5)
WBC: 13.1 10*3/uL — ABNORMAL HIGH (ref 4.0–10.5)

## 2016-08-07 LAB — I-STAT BETA HCG BLOOD, ED (MC, WL, AP ONLY): I-stat hCG, quantitative: <5 m[IU]/mL (ref <5)

## 2016-08-07 LAB — I-STAT TROPONIN, ED: Troponin i, poc: 0 ng/mL (ref 0.00–0.08)

## 2016-08-07 MED ORDER — DEXTROSE 5 % IV SOLN
500.0000 mg | Freq: Once | INTRAVENOUS | Status: AC
  Administered 2016-08-07: 500 mg via INTRAVENOUS
  Filled 2016-08-07: qty 500

## 2016-08-07 MED ORDER — LEVOFLOXACIN 750 MG PO TABS: 750.0000 mg | ORAL_TABLET | Freq: Every day | ORAL | 0 refills | Status: AC

## 2016-08-07 MED ORDER — KETOROLAC TROMETHAMINE 15 MG/ML IJ SOLN
15.0000 mg | Freq: Once | INTRAMUSCULAR | Status: AC
  Administered 2016-08-07: 15 mg via INTRAVENOUS
  Filled 2016-08-07: qty 1

## 2016-08-07 MED ORDER — MORPHINE SULFATE (PF) 4 MG/ML IV SOLN
4.0000 mg | Freq: Once | INTRAVENOUS | Status: AC
  Administered 2016-08-07: 4 mg via INTRAVENOUS
  Filled 2016-08-07: qty 1

## 2016-08-07 MED ORDER — IOPAMIDOL (ISOVUE-370) INJECTION 76%
INTRAVENOUS | Status: AC
  Administered 2016-08-07: 100 mL
  Filled 2016-08-07: qty 100

## 2016-08-07 MED ORDER — CEFTRIAXONE SODIUM 1 G IJ SOLR
1.0000 g | Freq: Once | INTRAMUSCULAR | Status: AC
  Administered 2016-08-07: 1 g via INTRAVENOUS
  Filled 2016-08-07: qty 10

## 2016-08-07 MED ORDER — HYDROCODONE-ACETAMINOPHEN 5-325 MG PO TABS: 1.0000 | ORAL_TABLET | ORAL | 0 refills | Status: DC | PRN

## 2016-08-07 MED ORDER — SODIUM CHLORIDE 0.9 % IV BOLUS (SEPSIS)
1000.0000 mL | Freq: Once | INTRAVENOUS | Status: AC
  Administered 2016-08-07: 1000 mL via INTRAVENOUS

## 2016-08-07 MED ORDER — NAPROXEN 375 MG PO TABS: 375.0000 mg | ORAL_TABLET | Freq: Two times a day (BID) | ORAL | 0 refills | Status: AC | PRN

## 2016-08-07 NOTE — ED Notes (Signed)
Gave patient sprite to drink

## 2016-08-07 NOTE — ED Provider Notes (Signed)
[facility] DEPT Provider Note   CSN: [id number] Arrival date & time: 08/07/16  1008     History   Chief Complaint Chief Complaint  Patient presents with  . Chest Pain    HPI [name] is a 60 y.o. female.  HPI 59 yo F with PMHx of sarcoid, DM who presents with a 1 week h/o nasal congestion, sinus pressure now with right-sided pain and SOB. Pt states that her sx started 1 week ago as mild nasal congestion and rhinorrhea with sore throat. These sx have improved but over the past 24 hours, she has developed cough, mild SOB with exertion along with significant right-sided CP that is sharp, worse with inspiration, but also worse with movement. She has had chills but no fevers. No alleviating factors. No h/o PE. She is not on any immunosuppressants for her sarcoid.  Past Medical History:  Diagnosis Date  . Chronic sinusitis   . Cough   . Diabetes mellitus without complication ([place])   . Hypertension   . Sarcoidosis [facility])     Patient Active Problem List   Diagnosis Date Noted  . 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 Sarcoid arthropathy ([place]) 01/08/2016  . Hereditary and idiopathic peripheral neuropathy 05/20/2014  . Anal condyloma 08/03/2013  . Chronic sinusitis 09/15/2011  . COUGH 06/16/2010  . SARCOIDOSIS 07/10/2008    Past Surgical History:  Procedure Laterality Date  . LYMPHADENECTOMY    . NOSE SURGERY    . TUBAL LIGATION    . VESICOVAGINAL FISTULA CLOSURE W/ TAH      OB History    No data available       Home Medications    Prior to Admission medications   Medication Sig Start Date End Date Taking? Authorizing Provider  aspirin 81 MG tablet Take 81 mg by mouth daily.    Historical Provider, MD  aspirin-acetaminophen-caffeine (EXCEDRIN MIGRAINE) [phone] MG per tablet Take 1 tablet by mouth every 6 (six) hours as needed for pain.    Historical Provider, MD  atorvastatin (LIPITOR) 20 MG tablet Take 20 mg by mouth daily.    Historical Provider, MD  Cholecalciferol  (VITAMIN D-3) 5000 UNITS TABS Take 5,000 Units by mouth daily.    Historical Provider, MD  clindamycin (CLEOCIN) 150 MG capsule Take 3 capsules (450 mg total) by mouth 3 (three) times daily. 04/30/15   Collene Gobble, MD  ergocalciferol (VITAMIN D2) 50000 UNITS capsule Take 50,000 Units by mouth once a week.    Historical Provider, MD  esomeprazole (NEXIUM) 40 MG capsule Take 1 capsule twice a day for 1 week, then take 1 capsule daily 11/08/14   Collene Gobble, MD  fenofibrate 160 MG tablet Take 160 mg by mouth daily.    Historical Provider, MD  fish oil-omega-3 fatty acids 1000 MG capsule Take 2 g by mouth daily.    Historical Provider, MD  fluticasone (FLONASE) 50 MCG/ACT nasal spray Use 1 spray into both  nostrils daily as needed 04/23/16   Collene Gobble, MD  gabapentin (NEURONTIN) 100 MG capsule Take 300 mg by mouth daily as needed. 03/28/14   Star Age, MD  guaiFENesin (MUCINEX) 600 MG 12 hr tablet Take by mouth 2 (two) times daily.    Historical Provider, MD  HYDROcodone-acetaminophen (NORCO/VICODIN) 5-325 MG tablet Take 1-2 tablets by mouth every 4 (four) hours as needed for moderate pain or severe pain. 08/07/16   Duffy Bruce, MD  Ibuprofen (MOTRIN PO) Take 200 mg by mouth as needed.  Historical Provider, MD  levofloxacin (LEVAQUIN) 750 MG tablet Take 1 tablet (750 mg total) by mouth daily. 08/07/16 08/14/16  Duffy Bruce, MD  loratadine (CLARITIN) 10 MG tablet Take 1 tablet (10 mg total) by mouth daily. 12/02/15   Collene Gobble, MD  losartan-hydrochlorothiazide (HYZAAR) 50-12.5 MG per tablet Take 1 tablet by mouth daily.    Historical Provider, MD  Multiple Vitamin (MULTIVITAMIN) capsule Take 1 capsule by mouth daily.      Historical Provider, MD  naproxen (NAPROSYN) 375 MG tablet Take 1 tablet (375 mg total) by mouth 2 (two) times daily as needed for moderate pain. 08/07/16 08/14/16  Duffy Bruce, MD  Naproxen Sod-Diphenhydramine (ALEVE PM PO) Take 1 tablet by mouth at bedtime as  needed and may repeat dose one time if needed.    Historical Provider, MD  niacin (NIASPAN) 1000 MG CR tablet Take 1,000 mg by mouth at bedtime.    Historical Provider, MD    Family History Family History  Problem Relation Age of Onset  . Stroke Father   . Heart murmur Father   . Diabetes Father   . Emphysema Mother   . COPD Mother   . Breast cancer Sister     Social History Social History  Substance Use Topics  . Smoking status: Never Smoker  . Smokeless tobacco: Never Used  . Alcohol use No     Allergies   Patient has no known allergies.   Review of Systems Review of Systems  Constitutional: Positive for chills and fatigue. Negative for fever.  HENT: Positive for congestion, rhinorrhea and sinus pressure.   Eyes: Negative for visual disturbance.  Respiratory: Positive for cough and shortness of breath. Negative for wheezing.   Cardiovascular: Positive for chest pain. Negative for leg swelling.  Gastrointestinal: Negative for abdominal pain, diarrhea, nausea and vomiting.  Genitourinary: Negative for dysuria and flank pain.  Musculoskeletal: Negative for neck pain and neck stiffness.  Skin: Negative for rash and wound.  Allergic/Immunologic: Negative for immunocompromised state.  Neurological: Negative for syncope, weakness and headaches.  All other systems reviewed and are negative.    Physical Exam Updated Vital Signs BP 117/74   Pulse 77   Temp 98.9 F (37.2 C) (Oral)   Resp 22   Ht 5\' 5"  (1.651 m)   Wt 163 lb (73.9 kg)   SpO2 94%   BMI 27.12 kg/m   Physical Exam  Constitutional: She is oriented to person, place, and time. She appears well-developed and well-nourished. No distress.  HENT:  Head: Normocephalic and atraumatic.  Moderate nasal congestion and rhinorrhea. Mild bilateral maxillary sinus TTP.  Eyes: Conjunctivae are normal.  Neck: Neck supple.  Cardiovascular: Normal rate, regular rhythm and normal heart sounds.  Exam reveals no friction  rub.   No murmur heard. Pulmonary/Chest: Effort normal. No respiratory distress. She has no wheezes. She has rales in the right lower field and the left lower field.  Abdominal: Soft. Bowel sounds are normal. She exhibits no distension.  Musculoskeletal: She exhibits no edema.  Neurological: She is alert and oriented to person, place, and time. She exhibits normal muscle tone.  Skin: Skin is warm. Capillary refill takes less than 2 seconds.  Psychiatric: She has a normal mood and affect.  Nursing note and vitals reviewed.    ED Treatments / Results  Labs (all labs ordered are listed, but only abnormal results are displayed) Labs Reviewed  BASIC METABOLIC PANEL - Abnormal; Notable for the following:  Result Value   Glucose, Bld 110 (*)    Creatinine, Ser 1.01 (*)    All other components within normal limits  CBC - Abnormal; Notable for the following:    WBC 13.1 (*)    All other components within normal limits  I-STAT TROPOININ, ED  I-STAT BETA HCG BLOOD, ED ([facility], [facility], [facility] ONLY)    EKG  EKG Interpretation  Date/Time:  Friday August 07 2016 10:14:24 EST Ventricular Rate:  90 PR Interval:  170 QRS Duration: 82 QT Interval:  338 QTC Calculation: 413 R Axis:   -9 Text Interpretation:  Normal sinus rhythm Possible Left atrial enlargement Borderline ECG No old tracing to compare No acute ST elevations Confirmed by Ellender Hose MD, Mathhew Buysse [phone]) on 08/07/2016 11:05:13 AM       Radiology Ct Angio Chest Pe W Or Wo Contrast  Result Date: 08/07/2016 CLINICAL DATA:  Right-sided chest pain and shortness breath for 2 days. Sarcoidosis. EXAM: CT ANGIOGRAPHY CHEST WITH CONTRAST TECHNIQUE: Multidetector CT imaging of the chest was performed using the standard protocol during bolus administration of intravenous contrast. Multiplanar CT image reconstructions and MIPs were obtained to evaluate the vascular anatomy. CONTRAST:  75 mL Isovue 370 COMPARISON:  01/13/2016 FINDINGS:  Cardiovascular: Satisfactory opacification of pulmonary arteries noted, and no pulmonary emboli identified. No evidence of thoracic aortic dissection or aneurysm. Coronary artery calcification. Mediastinum/Nodes: Symmetric bilateral hilar and mediastinal lymphadenopathy with focal calcifications shows no significant change compared to prior study, consistent with known sarcoidosis. Mild left axillary lymphadenopathy is also stable. Lungs/Pleura: Bibasilar chronic interstitial lung disease shows no significant interval change, and is most consistent with usual interstitial pneumonia. New airspace disease is seen in the posterior right upper lobe surrounding chronic bullous disease, suspicious for pneumonia. 2 cm nodule within bulla in the left lung apex is stable and consistent with thrombus ball/mycetoma . No evidence of pleural effusion. Upper abdomen: No acute findings. Musculoskeletal: No suspicious bone lesions or other significant abnormality identified. Review of the MIP images confirms the above findings. IMPRESSION: No evidence of pulmonary embolism. New airspace disease in posterior right upper lobe, suspicious for pneumonia. No significant change in symmetric bilateral hilar and mediastinal lymphadenopathy, consistent with known sarcoidosis. Stable mycetoma in left lung apex. Stable chronic bibasilar interstitial lung disease, with radiographic features of usual interstitial pneumonia (UIP). Electronically Signed   By: Earle Gell M.D.   On: 08/07/2016 13:02    Procedures Procedures (including critical care time)  Medications Ordered in ED Medications  sodium chloride 0.9 % bolus 1,000 mL (0 mLs Intravenous Stopped 08/07/16 1250)  cefTRIAXone (ROCEPHIN) 1 g in dextrose 5 % 50 mL IVPB (0 g Intravenous Stopped 08/07/16 1245)  azithromycin (ZITHROMAX) 500 mg in dextrose 5 % 250 mL IVPB (0 mg Intravenous Stopped 08/07/16 1435)  iopamidol (ISOVUE-370) 76 % injection (100 mLs  Contrast Given 08/07/16  1230)  ketorolac (TORADOL) 15 MG/ML injection 15 mg (15 mg Intravenous Given 08/07/16 1200)  morphine 4 MG/ML injection 4 mg (4 mg Intravenous Given 08/07/16 1200)     Initial Impression / Assessment and Plan / ED Course  I have reviewed the triage vital signs and the nursing notes.  Pertinent labs & imaging results that were available during my care of the patient were reviewed by me and considered in my medical decision making (see chart for details).  Clinical Course     60 yo F with PMHx of sarcoid, not on immune suppressants, sent here from PCP for eval of cough,  SOB. CXR at PCP c/w possible PNA versus infarct. On arrival, VSS and WNL. Satting well on RA. Exam shows R>L bibasilar rales. Given pleuritic pain, SOB, and high risk of PE given h/o sarcoid, will obtain CTA. Otherwise, screening labs show mild leukocytosis, cw infection, but are o/w unremarkable. Trop negative, EKG non-ischemic and I do not suspect ACS.  CTA neg for PE and is c/w CAP. Pt has not been on ABX recently. She is well appearing with low risk CURB-65 score. Will place on outpt ABX, encourage fluids, and close PCP f/u.  Final Clinical Impressions(s) / ED Diagnoses   Final diagnoses:  Pleurisy  Community acquired pneumonia of right lung, unspecified part of lung    New Prescriptions Discharge Medication List as of 08/07/2016  1:36 PM    START taking these medications   Details  HYDROcodone-acetaminophen (NORCO/VICODIN) 5-325 MG tablet Take 1-2 tablets by mouth every 4 (four) hours as needed for moderate pain or severe pain., Starting Fri 08/07/2016, Print    levofloxacin (LEVAQUIN) 750 MG tablet Take 1 tablet (750 mg total) by mouth daily., Starting Fri 08/07/2016, Until Fri 08/14/2016, Print    naproxen (NAPROSYN) 375 MG tablet Take 1 tablet (375 mg total) by mouth 2 (two) times daily as needed for moderate pain., Starting Fri 08/07/2016, Until Fri 08/14/2016, Print         Duffy Bruce, MD 08/08/16  0730

## 2016-08-07 NOTE — ED Notes (Signed)
Azithromycin still running. Waiting for it to be completed before discharging patient.

## 2016-08-07 NOTE — Telephone Encounter (Signed)
Spoke with pt. I have used one of the 15 min slots open on 08/10/16. Nothing further was needed.

## 2016-08-07 NOTE — ED Triage Notes (Signed)
Pt presents from [facility] for evaluation of chest pain x2 days to R side with no radiation, pt states some lightheadedness. Pt states also has headache x 1 week. States has had cough x 1.5 weeks with yellow sputum. Pt. With hx of sarcoidosis, had CXR at [facility] and showed atelectasis vs infilatrate. Pt. Denies SOB.

## 2016-08-10 ENCOUNTER — Ambulatory Visit (INDEPENDENT_AMBULATORY_CARE_PROVIDER_SITE_OTHER): Payer: 59 | Admitting: Emergency Medicine

## 2016-08-10 ENCOUNTER — Encounter: Payer: Self-pay | Admitting: Emergency Medicine

## 2016-08-10 VITALS — BP 136/80 | HR 61 | Ht 65.5 in | Wt 162.4 lb

## 2016-08-10 DIAGNOSIS — J189 Pneumonia, unspecified organism: Secondary | ICD-10-CM

## 2016-08-10 NOTE — Patient Instructions (Addendum)
Please complete your levaquin once a day through Thursday 08/13/16.  Continue your allergy medications as you have been taking them.  We will follow up with Dr Lamonte Sakai or APP with a CXR in 1 month We will continue to plan to repeat your Ct scan in May unless symptoms persist and we feel that this needs to be checked sooner.  Follow with Dr Lamonte Sakai in 22 Dec 2016 after the CT scan to review.

## 2016-08-10 NOTE — Progress Notes (Signed)
[name] is a very pleasant 60 year old woman with a history of stable sarcoidosis. Last seen 11/08. She is not on maintanance meds for sarcoidosis. Since last visit has had sinus surgery for sarcoid involvement [name]). Post-op course comp by MRSA, treated for eradication. Seen at [facility] regarding possible ocular sarcoidosis.    [name] 04/14/16 --  Follow-up visit for history of stage 3-4 sarcoid that has chronic sinus involvement, chronic pulmonary changes with scarring, and apparent Left upper lobe mycetoma. She has been experiencing joint pains, Has continued to do so since our last visit. Prior rheumatology evaluation was unrevealing.  B thumbs, hands. Possibly some motor involvement as well with her thumbs. She has tolerated prednisone poorly. She has been on MTX before  [name] 06/22/16 -- [name] follows up for her history of pulmonary sarcoidosis, stage 3-4, with chronic sinus involvement. She has an apparent left upper lobe mycetoma. I have been hesitant to restart her on image suppression therapy given the possibility of left upper lobe fungal involvement. I asked her to see Rheumatology again for arthralgias, neurology for a 2nd opinion regarding possible neuro - sarcoid.  She was seen by Rheum, still working on eval for possible sarcoid arthropathy. She saw neurology this am - unclear that this is neuro sarcoid. Her breathing is doing well.   [name] 08/10/16 -- follow up visit for stage 3-4 sarcoidosis with significant interstitial scar, LUL mycetoma. She was seen in the emergency department on 08/07/16 for sinus drainage, about a week of cough. Developed some R posterior flank pain. Cough was productive of foamy mucous. CT scan of her chest was done on that date that I personally reviewed. This showed no evidence of pulmonary embolism, chronic interstitial changes, stable bilateral hilar and mediastinal lymphadenopathy and a new posterior right upper lobe area of airspace disease  suspicious for PNA. She was treated with levaquin. Pain and cough are better.    EXAM :  Vitals:   08/10/16 1158 08/10/16 1201  BP:  136/80  BP Location:  Left Arm  Cuff Size:  Normal  Pulse:  61  SpO2:  100%  Weight: 162 lb 6.4 oz (73.7 kg)   Height: 5' 5.5" (1.664 m)    Gen: Pleasant, well-nourished, in no distress,  normal affect  ENT: No lesions,  mouth clear,  oropharynx clear, no postnasal drip, max sinus tenderness   Neck: No JVD, no TMG, no carotid bruits  Lungs: No use of accessory muscles, no dullness to percussion, coarse BS with no wheezing   Cardiovascular: RRR, heart sounds normal, no murmur or gallops, no peripheral edema  Musculoskeletal: No deformities, no cyanosis or clubbing  Neuro: alert, non focal  Skin: Warm, no lesions or rashes   CT chest 01/13/16 --  COMPARISON:  Chest CT 04/14/2015.  FINDINGS: Mediastinum/Lymph Nodes: Heart size is borderline enlarged. There is no significant pericardial fluid, thickening or pericardial calcification. There is atherosclerosis of the thoracic aorta, the great vessels of the mediastinum and the coronary arteries, including calcified atherosclerotic plaque in the left anterior descending coronary artery. Multiple enlarged mediastinal and bilateral hilar lymph nodes again noted, many of which are coarsely calcified. Lymph nodes measure up to 1.8 cm in short axis in the right paratracheal nodal station and subcarinal nodal station. Esophagus is unremarkable in appearance. Left axillary lymphadenopathy with lymph nodes measuring up to 1.5 cm in short axis.  Lungs/Pleura: High-resolution images again demonstrate extensive subpleural reticulation, parenchymal banding, traction bronchiectasis and honeycombing throughout the lower lungs  bilaterally, progressive compared to the prior study, compatible with underlying usual interstitial pneumonia. In addition, in a more patchy distribution with a mid to upper lung  predominance there are extensive areas of thickening of the peribronchovascular interstitium with nodular thickening of the septa and fissures, with some associated cylindrical and varicose bronchiectasis. This results in some fibrocavitary changes in the upper lungs bilaterally, similar to the prior examination, and is compatible with the reported clinical history of sarcoidosis. Notably, within a pre-existing cavity in the medial aspect of the left upper lobe near the apex there is increasing soft tissue on today's examination, measuring up to 1.6 x 1.8 cm (image 19 of series 5), compatible with a mycetoma. Inspiratory and expiratory imaging demonstrates some mild air trapping, indicative of mild small airways disease. No acute consolidative airspace disease. No pleural effusions.  Upper abdomen: Calcifications in the body of the pancreas are similar to the prior study, likely sequela of prior episodes of pancreatitis. 1.7 cm low-attenuation lesion in the upper pole of the right kidney is incompletely characterized on today's noncontrast CT examination, but is similar to the prior study, likely a cyst.  Musculoskeletal: There are no aggressive appearing lytic or blastic lesions noted in the visualized portions of the skeleton.  IMPRESSION: 1. Previously suspected mycetoma in the medial aspect of the left upper lobe is now clearly evident, in currently measures 1.6 x 1.8 cm (image 19 of series 5). 2. Chronic findings in the thorax compatible with the reported clinical history of sarcoidosis, including mediastinal and bilateral hilar lymphadenopathy, as well as chronic lung findings, as above. 3. In addition, there is progressive interstitial lung disease most evident throughout the lung bases which is considered diagnostic from an imaging standpoint of the usual interstitial pneumonia (UIP). 4. Atherosclerosis, including left anterior descending coronary artery disease. Please  note that although the presence of coronary artery calcium documents the presence of coronary artery disease, the severity of this disease and any potential stenosis cannot be assessed on this non-gated CT examination. Assessment for potential risk factor modification, dietary therapy or pharmacologic therapy may be warranted, if clinically indicated.   .  CT sinuses 07/13/12 --  Comparison: 04/15/2011.  Findings: Negative visualized noncontrast brain parenchyma.  Grossly normal orbit and face soft tissues.  Visualized tympanic cavities are clear.  Visualized sphenoid sinuses are clear.  Previous maxillary antrostomies, turbinatectomies, and  ethmoidectomies. Stable mucoperiosteal thickening throughout the  maxillary and residual ethmoid sinuses.  Frontal sinuses left well visualized today but appear clear.  No acute osseous abnormality identified.  IMPRESSION:  Extensive changes of paranasal sinus surgery again noted with  chronic paranasal sinusitis, no significant change since 2012 and  no new areas of involvement.    SARCOIDOSIS Her recent symptoms are related to a right sided community acquired pneumonia. Certainly must also consider a flare for sarcoidosis. Because she is clinically improving on Levaquin I like for her to complete this. We will defer any immunosuppression at this time. If her sx persist Or if an infiltrate persists on chest x-ray then I will likely repeat her CT scan earlier than our planned in May 2018  CAP (community acquired pneumonia) Pneumonia noted on CT scan of the chest 08/07/16. Complete levaquin x 7 days. Repeat chest x-ray in 1 month and assess clinical improvement.   Baltazar Apo, MD, PhD 08/10/2016, 12:17 PM [facility] [phone] or if no answer [phone]

## 2016-08-10 NOTE — Assessment & Plan Note (Signed)
Her recent symptoms are related to a right sided community acquired pneumonia. Certainly must also consider a flare for sarcoidosis. Because she is clinically improving on Levaquin I like for her to complete this. We will defer any immunosuppression at this time. If her sx persist Or if an infiltrate persists on chest x-ray then I will likely repeat her CT scan earlier than our planned in May 2018

## 2016-08-10 NOTE — Assessment & Plan Note (Signed)
Pneumonia noted on CT scan of the chest 08/07/16. Complete levaquin x 7 days. Repeat chest x-ray in 1 month and assess clinical improvement.

## 2016-08-13 ENCOUNTER — Encounter (HOSPITAL_COMMUNITY): Payer: 59

## 2016-08-13 ENCOUNTER — Ambulatory Visit (HOSPITAL_COMMUNITY)
Admission: RE | Admit: 2016-08-13 | Discharge: 2016-08-13 | Disposition: A | Discharge status: Home / Self Care | Payer: 59 | Source: Ambulatory Visit | Attending: Cardiology | Admitting: Cardiology

## 2016-08-13 DIAGNOSIS — I10 Essential (primary) hypertension: Secondary | ICD-10-CM | POA: Diagnosis not present

## 2016-08-13 DIAGNOSIS — E785 Hyperlipidemia, unspecified: Secondary | ICD-10-CM | POA: Diagnosis not present

## 2016-08-13 DIAGNOSIS — R202 Paresthesia of skin: Secondary | ICD-10-CM | POA: Diagnosis not present

## 2016-08-14 ENCOUNTER — Encounter: Payer: Self-pay | Admitting: *Deleted

## 2016-09-07 ENCOUNTER — Ambulatory Visit (INDEPENDENT_AMBULATORY_CARE_PROVIDER_SITE_OTHER): Payer: 59 | Admitting: Adult Health

## 2016-09-07 ENCOUNTER — Encounter: Payer: Self-pay | Admitting: Adult Health

## 2016-09-07 ENCOUNTER — Ambulatory Visit (INDEPENDENT_AMBULATORY_CARE_PROVIDER_SITE_OTHER)
Admission: RE | Admit: 2016-09-07 | Discharge: 2016-09-07 | Disposition: A | Discharge status: Home / Self Care | Payer: 59 | Source: Ambulatory Visit | Attending: Adult Health | Admitting: Adult Health

## 2016-09-07 VITALS — BP 140/88 | HR 85 | Ht 65.5 in | Wt 165.8 lb

## 2016-09-07 DIAGNOSIS — J189 Pneumonia, unspecified organism: Secondary | ICD-10-CM | POA: Diagnosis not present

## 2016-09-07 DIAGNOSIS — D869 Sarcoidosis, unspecified: Secondary | ICD-10-CM | POA: Diagnosis not present

## 2016-09-07 MED ORDER — PREDNISONE 10 MG PO TABS: ORAL_TABLET | ORAL | 0 refills | Status: DC

## 2016-09-07 MED ORDER — AMOXICILLIN-POT CLAVULANATE 875-125 MG PO TABS: 1.0000 | ORAL_TABLET | Freq: Two times a day (BID) | ORAL | 0 refills | Status: AC

## 2016-09-07 NOTE — Progress Notes (Signed)
@Patient  ID: [name], female    DOB: 11/06/1955, 61 y.o.   MRN: [id number]  Chief Complaint  Patient presents with  . 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   09/07/2016 Acute OV : Cough/PNA  Pt presents for an acute office visit she was seen in ER on 12/15 dx w/ CAP with RUL aspdz on CT chest .  She was started on levaquin  X 7 days . Says she was feeling some better but cough never went totally away . Cough , congestion and low grade fever returns few days ago. She has body aches that have been on/off as well. CXR today with chronic changes , no acute infiltrate  Denies fever, hemoptysis , chest pain or orthopnea.    No Known Allergies  Immunization History  Administered Date(s) Administered  . Influenza Split 06/21/2011, 05/23/2012, 05/24/2013  . Influenza Whole 05/24/2010  . Influenza,inj,Quad PF,36+ Mos 06/07/2015, 06/05/2016  . Pneumococcal Conjugate-13 06/07/2015    Past Medical History:  Diagnosis Date  . Chronic sinusitis   . Cough   . Diabetes mellitus without complication ([place])   . Hypertension   . Sarcoidosis ([place])     Tobacco History: History  Smoking Status  . Never Smoker  Smokeless Tobacco  . Never Used   Counseling given: Not Answered   Outpatient Encounter Prescriptions as of 09/07/2016  Medication Sig  . aspirin 81 MG tablet Take 81 mg by mouth daily.  Marland Kitchen aspirin-acetaminophen-caffeine (EXCEDRIN MIGRAINE) 250-250-65 MG per tablet Take 1 tablet by mouth every 6 (six) hours as needed for pain.  Marland Kitchen atorvastatin (LIPITOR) 20 MG tablet Take 20 mg by mouth daily.  . ergocalciferol (VITAMIN D2) 50000 UNITS capsule Take 50,000 Units by mouth once a week.  . esomeprazole (NEXIUM) 40 MG capsule Take 1 capsule twice a day for 1 week, then take 1 capsule daily  . fenofibrate 160 MG tablet Take 160 mg  by mouth daily.  . fish oil-omega-3 fatty acids 1000 MG capsule Take 2 g by mouth daily.  . fluticasone (FLONASE) 50 MCG/ACT nasal spray Use 1 spray into both  nostrils daily as needed  . gabapentin (NEURONTIN) 100 MG capsule Take 300 mg by mouth daily as needed.  Marland Kitchen guaiFENesin (MUCINEX) 600 MG 12 hr tablet Take by mouth 2 (two) times daily.  . Ibuprofen (MOTRIN PO) Take 200 mg by mouth as needed.  . loratadine (CLARITIN) 10 MG tablet Take 1 tablet (10 mg total) by mouth daily.  Marland Kitchen losartan-hydrochlorothiazide (HYZAAR) 50-12.5 MG per tablet Take 1 tablet by mouth daily.  . Multiple Vitamin (MULTIVITAMIN) capsule Take 1 capsule by mouth daily.    . Naproxen Sod-Diphenhydramine (ALEVE PM PO) Take 1 tablet by mouth at bedtime as needed and may repeat dose one time if needed.  . niacin (NIASPAN) 1000 MG CR tablet Take 1,000 mg by mouth at bedtime.  Marland Kitchen amoxicillin-clavulanate (AUGMENTIN) 875-125 MG tablet Take 1 tablet by mouth 2 (two) times daily.  . Cholecalciferol (VITAMIN D-3) 5000 UNITS TABS Take 5,000 Units by mouth daily.  Marland Kitchen HYDROcodone-acetaminophen (NORCO/VICODIN) 5-325 MG tablet Take 1-2 tablets by mouth every 4 (four) hours as needed for moderate pain or severe pain. (Patient not taking: Reported on 09/07/2016)  . predniSONE (DELTASONE) 10 MG tablet 4 tabs for 2 days, then 3 tabs for 2  days, 2 tabs for 2 days, then 1 tab for 2 days, then stop  . [DISCONTINUED] clindamycin (CLEOCIN) 150 MG capsule Take 3 capsules (450 mg total) by mouth 3 (three) times daily. (Patient not taking: Reported on 09/07/2016)   No facility-administered encounter medications on file as of 09/07/2016.      Review of Systems  Constitutional:   No  weight loss, night sweats,   +Fevers, chills,  +fatigue, or  lassitude.  HEENT:   No headaches,  Difficulty swallowing,  Tooth/dental problems, or  Sore throat,                No sneezing, itching, ear ache,  +nasal congestion, post nasal drip,   CV:  No chest pain,   Orthopnea, PND, swelling in lower extremities, anasarca, dizziness, palpitations, syncope.   GI  No heartburn, indigestion, abdominal pain, nausea, vomiting, diarrhea, change in bowel habits, loss of appetite, bloody stools.   Resp:    No chest wall deformity  Skin: no rash or lesions.  GU: no dysuria, change in color of urine, no urgency or frequency.  No flank pain, no hematuria   MS:  No joint pain or swelling.  No decreased range of motion.  No back pain.    Physical Exam  BP 140/88 (BP Location: Right Arm, Patient Position: Sitting, Cuff Size: Normal)   Pulse 85   Ht 5' 5.5" (1.664 m)   Wt 165 lb 12.8 oz (75.2 kg)   SpO2 100%   BMI 27.17 kg/m   GEN: A/Ox3; pleasant , NAD   HEENT:  Neche/AT,  EACs-clear, TMs-wnl, NOSE-clear, THROAT-clear, no lesions, no postnasal drip or exudate noted.   NECK:  Supple w/ fair ROM; no JVD; normal carotid impulses w/o bruits; no thyromegaly or nodules palpated; no lymphadenopathy.    RESP  Clear  P & A; w/o, wheezes/ rales/ or rhonchi. no accessory muscle use, no dullness to percussion  CARD:  RRR, no m/r/g, no peripheral edema, pulses intact, no cyanosis or clubbing.  GI:   Soft & nt; nml bowel sounds; no organomegaly or masses detected.   Musco: Warm bil, no deformities or joint swelling noted.   Neuro: alert, no focal deficits noted.    Skin: Warm, no lesions or rashes  Psych:  No change in mood or affect. No depression or anxiety.  No memory loss.  Lab Results:  CBC    Component Value Date/Time   WBC 13.1 (H) 08/07/2016 1021   RBC 4.51 08/07/2016 1021   HGB 12.5 08/07/2016 1021   HCT 38.4 08/07/2016 1021   PLT 236 08/07/2016 1021   MCV 85.1 08/07/2016 1021   MCH 27.7 08/07/2016 1021   MCHC 32.6 08/07/2016 1021   RDW 13.7 08/07/2016 1021   LYMPHSABS 1.5 07/07/2012 0743   MONOABS 0.4 07/07/2012 0743   EOSABS 0.3 07/07/2012 0743   BASOSABS 0.0 07/07/2012 0743    BMET    Component Value Date/Time   NA 138 08/07/2016  1021   K 3.6 08/07/2016 1021   CL 104 08/07/2016 1021   CO2 24 08/07/2016 1021   GLUCOSE 110 (H) 08/07/2016 1021   BUN 16 08/07/2016 1021   CREATININE 1.01 (H) 08/07/2016 1021   CALCIUM 9.9 08/07/2016 1021   GFRNONAA >60 08/07/2016 1021   GFRAA >60 08/07/2016 1021    BNP No results found for: BNP  ProBNP No results found for: PROBNP  Imaging: Dg Chest 2 View  Result Date: 09/07/2016 CLINICAL DATA:  Recent pneumonia.  Fever with cough and congestion. History of sarcoidosis EXAM: CHEST  2 VIEW COMPARISON:  Chest radiograph November 08, 2014 and chest CT August 07, 2016 FINDINGS: There is persistent bibasilar fibrosis. There is scarring in the right upper lobe region. There is no frank edema or consolidation. Heart is upper normal in size, stable. The pulmonary vascularity is within normal limits. Mild adenopathy in the hilar regions is appreciable radiography, unchanged. There is also sub- carinal adenopathy and equivocal right peritracheal adenopathy. Adenopathy is much better seen on recent CT. No bone lesions. IMPRESSION: Areas of scarring and fibrosis with adenopathy, likely secondary to known sarcoidosis. Adenopathy is much better seen by CT than on this current radiographic examination. No new opacity evident. Note that there is calcification in the aortopulmonary window region, stable, and likely within a lymph node, again due to sarcoidosis. Electronically Signed   By: Lowella Grip III M.D.   On: 09/07/2016 15:38     Assessment & Plan:   CAP (community acquired pneumonia) Slow to resolve clinically with bronchitic like symptoms  CXR does not show any acute infiltrate  Will treat with additional abx  Plan  Patient Instructions  Augmentin 875mg  Twice daily  For 7 days  Prednisone taper over next week .  Mucinex DM Twice daily  As needed  Cough/congestion .  Fluids and rest  follow up Dr. Lamonte Sakai  6-8 weeks and As needed .  Please contact office for sooner follow up if  symptoms do not improve or worsen or seek emergency care      SARCOIDOSIS ?flare with bronchitis   Plan  Patient Instructions  Augmentin 875mg  Twice daily  For 7 days  Prednisone taper over next week .  Mucinex DM Twice daily  As needed  Cough/congestion .  Fluids and rest  follow up Dr. Lamonte Sakai  6-8 weeks and As needed .  Please contact office for sooner follow up if symptoms do not improve or worsen or seek emergency care         Rexene Edison, NP 09/07/2016

## 2016-09-07 NOTE — Patient Instructions (Signed)
Augmentin 875mg  Twice daily  For 7 days  Prednisone taper over next week .  Mucinex DM Twice daily  As needed  Cough/congestion .  Fluids and rest  follow up Dr. Lamonte Sakai  6-8 weeks and As needed .  Please contact office for sooner follow up if symptoms do not improve or worsen or seek emergency care

## 2016-09-07 NOTE — Assessment & Plan Note (Signed)
Slow to resolve clinically with bronchitic like symptoms  CXR does not show any acute infiltrate  Will treat with additional abx  Plan  Patient Instructions  Augmentin 875mg  Twice daily  For 7 days  Prednisone taper over next week .  Mucinex DM Twice daily  As needed  Cough/congestion .  Fluids and rest  follow up Dr. Lamonte Sakai  6-8 weeks and As needed .  Please contact office for sooner follow up if symptoms do not improve or worsen or seek emergency care

## 2016-09-07 NOTE — Assessment & Plan Note (Signed)
?  flare with bronchitis   Plan  Patient Instructions  Augmentin 875mg  Twice daily  For 7 days  Prednisone taper over next week .  Mucinex DM Twice daily  As needed  Cough/congestion .  Fluids and rest  follow up Dr. Lamonte Sakai  6-8 weeks and As needed .  Please contact office for sooner follow up if symptoms do not improve or worsen or seek emergency care

## 2016-09-21 ENCOUNTER — Ambulatory Visit: Payer: 59 | Admitting: Emergency Medicine

## 2016-09-21 ENCOUNTER — Telehealth: Payer: Self-pay | Admitting: Emergency Medicine

## 2016-09-21 ENCOUNTER — Telehealth: Payer: Self-pay | Admitting: Adult Health

## 2016-09-21 MED ORDER — PREDNISONE 10 MG PO TABS: ORAL_TABLET | ORAL | 0 refills | Status: DC

## 2016-09-21 NOTE — Telephone Encounter (Signed)
Called and spoke to pt. Pt was seen by TP on 09/07/16 and was given Augmentin. Pt states the abx helped slightly but is still c/o prod cough with yellow mucus, fatigue, weakness, SOB is unchanged, chest tightness. Pt states she had 99.7-99.9 over the weekend. Pt states she completed Augmentin on 1/22. Pt taking Ibuprofen prn to help with the headache and fever. Pt is requesting recs.   TP please advise. Thanks.

## 2016-09-21 NOTE — Telephone Encounter (Signed)
Patient is returning phone call.  °

## 2016-09-21 NOTE — Telephone Encounter (Signed)
Patient request to call her [phone].

## 2016-09-21 NOTE — Telephone Encounter (Signed)
lmtcb for pt.  

## 2016-09-21 NOTE — Telephone Encounter (Signed)
lmtcb x1 for pt. 

## 2016-09-21 NOTE — Telephone Encounter (Signed)
Pt has been treated with 2 abx . Last cxr showed chronic changes of sarcoid . Marland Kitchen Did the prednisone seem to help?  If so can restart prednisone 10mg  2 tabs for 1 week then 10mg  daily for 1 week then stop . #21.  If not needs ov with Dr. Lamonte Sakai  .  Please contact office for sooner follow up if symptoms do not improve or worsen or seek emergency care

## 2016-09-21 NOTE — Telephone Encounter (Signed)
Returned call, please call her on cell [phone].

## 2016-09-21 NOTE — Telephone Encounter (Signed)
Spoke with Raquel Sarna at [facility]. Advised her that I sent that sig incorrectly. I have fixed this mistake. Nothing further was needed.

## 2016-09-21 NOTE — Telephone Encounter (Signed)
Patient returned call, Delana Meyer unable to speak with her when she called back due to with a patient in the lobby.  Please call patient on cell phone, [phone].  She had ringer off and will have this on now and it is in her hand.

## 2016-09-21 NOTE — Telephone Encounter (Signed)
Spoke with pt. She is aware of TP's recommendations. Pt would like to have prednisone sent in. She has also been scheduled to see RB after she finishes the prednisone. Nothing further was needed.

## 2016-10-12 ENCOUNTER — Ambulatory Visit: Payer: 59 | Admitting: Emergency Medicine

## 2016-10-13 ENCOUNTER — Encounter: Payer: Self-pay | Admitting: Emergency Medicine

## 2016-10-13 ENCOUNTER — Ambulatory Visit (INDEPENDENT_AMBULATORY_CARE_PROVIDER_SITE_OTHER): Payer: 59 | Admitting: Emergency Medicine

## 2016-10-13 VITALS — BP 136/78 | HR 60 | Ht 65.5 in | Wt 167.0 lb

## 2016-10-13 DIAGNOSIS — D869 Sarcoidosis, unspecified: Secondary | ICD-10-CM | POA: Diagnosis not present

## 2016-10-13 DIAGNOSIS — D8686 Sarcoid arthropathy: Secondary | ICD-10-CM | POA: Diagnosis not present

## 2016-10-13 NOTE — Patient Instructions (Signed)
Please continue your current medications as you have been taking them We will repeat a high-resolution CT scan of the chest without contrast in May 2018 to follow your sarcoidosis. Follow with Dr Lamonte Sakai in May to review the results of your scan, or sooner if you have any problems.

## 2016-10-13 NOTE — Progress Notes (Signed)
[name] is a very pleasant 61 year old woman with a history of stable sarcoidosis. Last seen 11/08. She is not on maintanance meds for sarcoidosis. Since last visit has had sinus surgery for sarcoid involvement [name]). Post-op course comp by MRSA, treated for eradication. Seen at [facility] regarding possible ocular sarcoidosis.    [name] 04/14/16 --  Follow-up visit for history of stage 3-4 sarcoid that has chronic sinus involvement, chronic pulmonary changes with scarring, and apparent Left upper lobe mycetoma. She has been experiencing joint pains, Has continued to do so since our last visit. Prior rheumatology evaluation was unrevealing.  B thumbs, hands. Possibly some motor involvement as well with her thumbs. She has tolerated prednisone poorly. She has been on MTX before  [name] 06/22/16 -- [name] follows up for her history of pulmonary sarcoidosis, stage 3-4, with chronic sinus involvement. She has an apparent left upper lobe mycetoma. I have been hesitant to restart her on image suppression therapy given the possibility of left upper lobe fungal involvement. I asked her to see Rheumatology again for arthralgias, neurology for a 2nd opinion regarding possible neuro - sarcoid.  She was seen by Rheum, still working on eval for possible sarcoid arthropathy. She saw neurology this am - unclear that this is neuro sarcoid. Her breathing is doing well.   [name] 08/10/16 -- follow up visit for stage 3-4 sarcoidosis with significant interstitial scar, LUL mycetoma. She was seen in the emergency department on 08/07/16 for sinus drainage, about a week of cough. Developed some R posterior flank pain. Cough was productive of foamy mucous. CT scan of her chest was done on that date that I personally reviewed. This showed no evidence of pulmonary embolism, chronic interstitial changes, stable bilateral hilar and mediastinal lymphadenopathy and a new posterior right upper lobe area of airspace disease  suspicious for PNA. She was treated with levaquin. Pain and cough are better.   [name] 10/13/16 -- history of stage 3-4 sarcoidosis with significant interstitial lung disease. She has a left upper lobe mycetoma. She unfortunately had a recent right upper lobe community-acquired pneumonia. She was treated with additional Augmentin and prednisone on 09/07/16 for persistence of symptoms, then another round of pred after. Her chest x-ray in January showed improvement in her infiltrate. She is better. Cough is almost fully resolved. No sputum. Still not back to full energy level. Has some stable exertional dyspnea.    EXAM :  Vitals:   10/13/16 1607  BP: 136/78  BP Location: Left Arm  Cuff Size: Normal  Pulse: 60  SpO2: 100%  Weight: 167 lb (75.8 kg)  Height: 5' 5.5" (1.664 m)   Gen: Pleasant, well-nourished, in no distress,  normal affect  ENT: No lesions,  mouth clear,  oropharynx clear, no postnasal drip, max sinus tenderness   Neck: No JVD, no TMG, no carotid bruits  Lungs: No use of accessory muscles, no dullness to percussion, coarse BS with no wheezing   Cardiovascular: RRR, heart sounds normal, no murmur or gallops, no peripheral edema  Musculoskeletal: No deformities, no cyanosis or clubbing  Neuro: alert, non focal  Skin: Warm, no lesions or rashes   CT chest 01/13/16 --  COMPARISON:  Chest CT 04/14/2015.  FINDINGS: Mediastinum/Lymph Nodes: Heart size is borderline enlarged. There is no significant pericardial fluid, thickening or pericardial calcification. There is atherosclerosis of the thoracic aorta, the great vessels of the mediastinum and the coronary arteries, including calcified atherosclerotic plaque in the left anterior descending coronary artery. Multiple  enlarged mediastinal and bilateral hilar lymph nodes again noted, many of which are coarsely calcified. Lymph nodes measure up to 1.8 cm in short axis in the right paratracheal nodal station and subcarinal  nodal station. Esophagus is unremarkable in appearance. Left axillary lymphadenopathy with lymph nodes measuring up to 1.5 cm in short axis.  Lungs/Pleura: High-resolution images again demonstrate extensive subpleural reticulation, parenchymal banding, traction bronchiectasis and honeycombing throughout the lower lungs bilaterally, progressive compared to the prior study, compatible with underlying usual interstitial pneumonia. In addition, in a more patchy distribution with a mid to upper lung predominance there are extensive areas of thickening of the peribronchovascular interstitium with nodular thickening of the septa and fissures, with some associated cylindrical and varicose bronchiectasis. This results in some fibrocavitary changes in the upper lungs bilaterally, similar to the prior examination, and is compatible with the reported clinical history of sarcoidosis. Notably, within a pre-existing cavity in the medial aspect of the left upper lobe near the apex there is increasing soft tissue on today's examination, measuring up to 1.6 x 1.8 cm (image 19 of series 5), compatible with a mycetoma. Inspiratory and expiratory imaging demonstrates some mild air trapping, indicative of mild small airways disease. No acute consolidative airspace disease. No pleural effusions.  Upper abdomen: Calcifications in the body of the pancreas are similar to the prior study, likely sequela of prior episodes of pancreatitis. 1.7 cm low-attenuation lesion in the upper pole of the right kidney is incompletely characterized on today's noncontrast CT examination, but is similar to the prior study, likely a cyst.  Musculoskeletal: There are no aggressive appearing lytic or blastic lesions noted in the visualized portions of the skeleton.  IMPRESSION: 1. Previously suspected mycetoma in the medial aspect of the left upper lobe is now clearly evident, in currently measures 1.6 x 1.8 cm (image 19  of series 5). 2. Chronic findings in the thorax compatible with the reported clinical history of sarcoidosis, including mediastinal and bilateral hilar lymphadenopathy, as well as chronic lung findings, as above. 3. In addition, there is progressive interstitial lung disease most evident throughout the lung bases which is considered diagnostic from an imaging standpoint of the usual interstitial pneumonia (UIP). 4. Atherosclerosis, including left anterior descending coronary artery disease. Please note that although the presence of coronary artery calcium documents the presence of coronary artery disease, the severity of this disease and any potential stenosis cannot be assessed on this non-gated CT examination. Assessment for potential risk factor modification, dietary therapy or pharmacologic therapy may be warranted, if clinically indicated.   .  CT sinuses 07/13/12 --  Comparison: 04/15/2011.  Findings: Negative visualized noncontrast brain parenchyma.  Grossly normal orbit and face soft tissues.  Visualized tympanic cavities are clear.  Visualized sphenoid sinuses are clear.  Previous maxillary antrostomies, turbinatectomies, and  ethmoidectomies. Stable mucoperiosteal thickening throughout the  maxillary and residual ethmoid sinuses.  Frontal sinuses left well visualized today but appear clear.  No acute osseous abnormality identified.  IMPRESSION:  Extensive changes of paranasal sinus surgery again noted with  chronic paranasal sinusitis, no significant change since 2012 and  no new areas of involvement.    CAP (community acquired pneumonia) Recent right upper lobe community-acquired pneumonia. Given her slow resolution and the need for prednisone in order to get over this illness I question whether this may have also been a component of sarcoidosis. Antibiotics alone did not appear to resolve the problem. Clinically she is better and her chest x-ray shows  resolution.  Sarcoid arthropathy ([place]) Improved after recent steroids.Improved after recent steroids.   SARCOIDOSIS Unclear how much of her recent illness and RUL infiltrate may have been sarcoidosis as opposed to CAP. She was treated with abx and then with steroids. Given her improvement and resolution of her infiltrate I believe that we can wait to repeat her CT scan in May as planned. Continue allergy regiman. Not currently on scheduled BD's  Baltazar Apo, MD, PhD 10/13/2016, 4:32 PM [facility] [phone] or if no answer [phone]

## 2016-10-13 NOTE — Assessment & Plan Note (Signed)
Improved after recent steroids.Improved after recent steroids.

## 2016-10-13 NOTE — Assessment & Plan Note (Signed)
Recent right upper lobe community-acquired pneumonia. Given her slow resolution and the need for prednisone in order to get over this illness I question whether this may have also been a component of sarcoidosis. Antibiotics alone did not appear to resolve the problem. Clinically she is better and her chest x-ray shows resolution.

## 2016-10-13 NOTE — Assessment & Plan Note (Signed)
Unclear how much of her recent illness and RUL infiltrate may have been sarcoidosis as opposed to CAP. She was treated with abx and then with steroids. Given her improvement and resolution of her infiltrate I believe that we can wait to repeat her CT scan in May as planned. Continue allergy regiman. Not currently on scheduled BD's

## 2016-10-23 ENCOUNTER — Other Ambulatory Visit: Payer: Self-pay | Admitting: Emergency Medicine

## 2016-10-29 ENCOUNTER — Telehealth: Payer: Self-pay | Admitting: Emergency Medicine

## 2016-10-29 MED ORDER — PREDNISONE 20 MG PO TABS: 20.0000 mg | ORAL_TABLET | Freq: Every day | ORAL | 1 refills | Status: DC

## 2016-10-29 NOTE — Telephone Encounter (Signed)
Pt aware of rec's per Dr Lamonte Sakai.  Prednisone 20mg  was sent to [facility].  Appt scheduled with TP 10/19/16 at 215p Nothing further needed.

## 2016-10-29 NOTE — Telephone Encounter (Signed)
I believe she needs to start prednisone 20mg  daily. We will likely need to treat for several weeks and then repeat her Ct chest. Have her come in to be seen by me or APP in next 2 weeks to assess her and to order the scan.

## 2016-10-29 NOTE — Telephone Encounter (Signed)
Pt c/o cough and chest tightness x 2 weeks.  Pt denies any mucus production with cough, SOB or wheezing.  Pt notes that cough is deep. Worsens with exertion.  Decreased energy levels.  Using Delsym for cough -- little help with symptoms.   Please advise Dr Lamonte Sakai. Thanks.

## 2016-11-16 ENCOUNTER — Encounter: Payer: Self-pay | Admitting: Adult Health

## 2016-11-16 ENCOUNTER — Ambulatory Visit (INDEPENDENT_AMBULATORY_CARE_PROVIDER_SITE_OTHER): Payer: 59 | Admitting: Adult Health

## 2016-11-16 DIAGNOSIS — J320 Chronic maxillary sinusitis: Secondary | ICD-10-CM | POA: Diagnosis not present

## 2016-11-16 DIAGNOSIS — D869 Sarcoidosis, unspecified: Secondary | ICD-10-CM

## 2016-11-16 DIAGNOSIS — B479 Mycetoma, unspecified: Secondary | ICD-10-CM | POA: Diagnosis not present

## 2016-11-16 MED ORDER — PREDNISONE 10 MG PO TABS: ORAL_TABLET | ORAL | 1 refills | Status: DC

## 2016-11-16 NOTE — Addendum Note (Signed)
Addended by: Parke Poisson E on: 11/16/2016 04:44 PM   Modules accepted: Orders

## 2016-11-16 NOTE — Patient Instructions (Addendum)
Continue on Prednisone 20mg  daily for 2 weeks then 10mg  daily  Set up HRCT CHest in 4-6 weeks .  follow up Dr. Lamonte Sakai  In 4-6 weeks with PFT and As needed  Please contact office for sooner follow up if symptoms do not improve or worsen or seek emergency care

## 2016-11-16 NOTE — Assessment & Plan Note (Addendum)
Slow to resolve flare -improving with steroids .  Will slow taper and repeat CT chest and PFT on return in 4 weeks.  Steroid pt ediucation given   Plan  Patient Instructions  Continue on Prednisone 20mg  daily for 2 weeks then 10mg  daily  Set up HRCT CHest in 4-6 weeks .  follow up Dr. Lamonte Sakai  In 4-6 weeks with PFT and As needed  Please contact office for sooner follow up if symptoms do not improve or worsen or seek emergency care

## 2016-11-16 NOTE — Progress Notes (Signed)
Reviewed, agree 

## 2016-11-16 NOTE — Addendum Note (Signed)
Addended by: Parke Poisson E on: 11/16/2016 03:07 PM   Modules accepted: Orders

## 2016-11-16 NOTE — Assessment & Plan Note (Signed)
Repeat CT chest in  4weeks.

## 2016-11-16 NOTE — Progress Notes (Signed)
@Patient  ID: [name], female    DOB: 1956-08-02, 61 y.o.   MRN: [id number]  Chief Complaint  Patient presents with  . 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   11/16/2016 Follow up : Stage 3-4 Sarcoid w/ significant ILD and LUL mycetoma .  Pt returns for 4 week follow up . She called into the clinic 3 weeks ago with sarcoid flare with cough and tightness. She was called in prednisone 20mg  daily . She is feeling better with decreased cough but still gets winded easily and has dry cough. Has sinus congestion and bloody nasal discharge. No fever or discolored mucus.  She denies chest pain, orthopnea, edema or hemoptysis .     No Known Allergies  Immunization History  Administered Date(s) Administered  . Influenza Split 06/21/2011, 05/23/2012, 05/24/2013  . Influenza Whole 05/24/2010  . Influenza,inj,Quad PF,36+ Mos 06/07/2015, 06/05/2016  . Pneumococcal Conjugate-13 06/07/2015    Past Medical History:  Diagnosis Date  . Chronic sinusitis   . Cough   . Diabetes mellitus without complication ([place])   . Hypertension   . Sarcoidosis ([place])     Tobacco History: History  Smoking Status  . Never Smoker  Smokeless Tobacco  . Never Used   Counseling given: Not Answered   Outpatient Encounter Prescriptions as of 11/16/2016  Medication Sig  . aspirin 81 MG tablet Take 81 mg by mouth daily.  Marland Kitchen aspirin-acetaminophen-caffeine (EXCEDRIN MIGRAINE) 250-250-65 MG per tablet Take 1 tablet by mouth every 6 (six) hours as needed for pain.  Marland Kitchen atorvastatin (LIPITOR) 20 MG tablet Take 20 mg by mouth daily.  . Cholecalciferol (VITAMIN D-3) 5000 UNITS TABS Take 5,000 Units by mouth daily.  . ergocalciferol (VITAMIN D2) 50000 UNITS capsule Take 50,000 Units by mouth once a week.  . esomeprazole (NEXIUM) 40 MG capsule Take 1 capsule twice a  day for 1 week, then take 1 capsule daily  . fenofibrate 160 MG tablet Take 160 mg by mouth daily.  . fish oil-omega-3 fatty acids 1000 MG capsule Take 2 g by mouth daily.  . fluticasone (FLONASE) 50 MCG/ACT nasal spray USE 1 SPRAY INTO BOTH  NOSTRILS DAILY AS NEEDED  . gabapentin (NEURONTIN) 100 MG capsule Take 300 mg by mouth daily as needed.  Marland Kitchen guaiFENesin (MUCINEX) 600 MG 12 hr tablet Take by mouth 2 (two) times daily.  Marland Kitchen HYDROcodone-acetaminophen (NORCO/VICODIN) 5-325 MG tablet Take 1-2 tablets by mouth every 4 (four) hours as needed for moderate pain or severe pain.  . Ibuprofen (MOTRIN PO) Take 200 mg by mouth as needed.  . loratadine (CLARITIN) 10 MG tablet Take 1 tablet (10 mg total) by mouth daily.  Marland Kitchen losartan-hydrochlorothiazide (HYZAAR) 50-12.5 MG per tablet Take 1 tablet by mouth daily.  . Multiple Vitamin (MULTIVITAMIN) capsule Take 1 capsule by mouth daily.    . Naproxen Sod-Diphenhydramine (ALEVE PM PO) Take 1 tablet by mouth at bedtime as needed and may repeat dose one time if needed.  . niacin (NIASPAN) 1000 MG CR tablet Take 1,000 mg by mouth at bedtime.  . predniSONE (DELTASONE) 20 MG tablet Take 1 tablet (20 mg total) by mouth daily with breakfast.   No facility-administered encounter medications on file as of 11/16/2016.      Review of Systems  Constitutional:   No  weight loss, night sweats,  Fevers, chills, +fatigue, or  lassitude.  HEENT:   No headaches,  Difficulty swallowing,  Tooth/dental problems, or  Sore throat,                No sneezing, itching, ear ache, + nasal congestion, post nasal drip,   CV:  No chest pain,  Orthopnea, PND, swelling in lower extremities, anasarca, dizziness, palpitations, syncope.   GI  No heartburn, indigestion, abdominal pain, nausea, vomiting, diarrhea, change in bowel habits, loss of appetite, bloody stools.   Resp:  No chest wall deformity  Skin: no rash or lesions.  GU: no dysuria, change in color of urine, no urgency  or frequency.  No flank pain, no hematuria   MS:  No joint pain or swelling.  No decreased range of motion.  No back pain.    Physical Exam  BP 134/80 (BP Location: Left Arm, Cuff Size: Normal)   Pulse 62   Ht 5\' 5"  (1.651 m)   Wt 174 lb 12.8 oz (79.3 kg)   SpO2 99%   BMI 29.09 kg/m   GEN: A/Ox3; pleasant , NAD   HEENT:  [place]/AT,  EACs-clear, TMs-wnl, NOSE-clear, THROAT-clear, no lesions, no postnasal drip or exudate noted.   NECK:  Supple w/ fair ROM; no JVD; normal carotid impulses w/o bruits; no thyromegaly or nodules palpated; no lymphadenopathy.    RESP  Clear  P & A; w/o, wheezes/ rales/ or rhonchi. no accessory muscle use, no dullness to percussion  CARD:  RRR, no m/r/g, no peripheral edema, pulses intact, no cyanosis or clubbing.  GI:   Soft & nt; nml bowel sounds; no organomegaly or masses detected.   Musco: Warm bil, no deformities or joint swelling noted.   Neuro: alert, no focal deficits noted.    Skin: Warm, no lesions or rashes    Lab Results:  CBCBNP No results found for: BNP  ProBNP No results found for: PROBNP  Imaging: No results found.   Assessment & Plan:   SARCOIDOSIS Slow to resolve flare -improving with steroids .  Will slow taper and repeat CT chest and PFT on return in 4 weeks.   Plan  Patient Instructions  Continue on Prednisone 20mg  daily for 2 weeks then 10mg  daily  Set up HRCT CHest in 4-6 weeks .  follow up Dr. Lamonte Sakai  In 4-6 weeks with PFT and As needed  Please contact office for sooner follow up if symptoms do not improve or worsen or seek emergency care      Chronic sinusitis Add saline nasal rinses As needed   Continue on claritin As needed    Mycetoma Repeat CT chest in  4weeks.      Rexene Edison, NP 11/16/2016

## 2016-11-16 NOTE — Assessment & Plan Note (Signed)
Add saline nasal rinses As needed   Continue on claritin As needed

## 2016-12-28 ENCOUNTER — Ambulatory Visit (INDEPENDENT_AMBULATORY_CARE_PROVIDER_SITE_OTHER)
Admission: RE | Admit: 2016-12-28 | Discharge: 2016-12-28 | Disposition: A | Discharge status: Home / Self Care | Payer: 59 | Source: Ambulatory Visit | Attending: Emergency Medicine | Admitting: Emergency Medicine

## 2016-12-28 DIAGNOSIS — D869 Sarcoidosis, unspecified: Secondary | ICD-10-CM

## 2017-01-13 ENCOUNTER — Ambulatory Visit (INDEPENDENT_AMBULATORY_CARE_PROVIDER_SITE_OTHER): Payer: 59 | Admitting: Emergency Medicine

## 2017-01-13 ENCOUNTER — Encounter: Payer: Self-pay | Admitting: Emergency Medicine

## 2017-01-13 ENCOUNTER — Telehealth: Payer: Self-pay | Admitting: Emergency Medicine

## 2017-01-13 DIAGNOSIS — D869 Sarcoidosis, unspecified: Secondary | ICD-10-CM | POA: Diagnosis not present

## 2017-01-13 LAB — PULMONARY FUNCTION TEST
DL/VA % pred: 84 %
DL/VA: 4.22 ml/min/mmHg/L
DLCO COR % PRED: 56 %
DLCO UNC % PRED: 57 %
DLCO UNC: 15.18 ml/min/mmHg
DLCO cor: 14.74 ml/min/mmHg
FEF 25-75 POST: 2.99 L/s
FEF 25-75 PRE: 2.13 L/s
FEF2575-%Change-Post: 40 %
FEF2575-%PRED-POST: 134 %
FEF2575-%PRED-PRE: 95 %
FEV1-%Change-Post: 4 %
FEV1-%PRED-POST: 85 %
FEV1-%Pred-Pre: 81 %
FEV1-POST: 1.91 L
FEV1-Pre: 1.83 L
FEV1FVC-%Change-Post: 6 %
FEV1FVC-%PRED-PRE: 104 %
FEV6-%CHANGE-POST: -1 %
FEV6-%PRED-POST: 77 %
FEV6-%PRED-PRE: 79 %
FEV6-POST: 2.16 L
FEV6-Pre: 2.2 L
FEV6FVC-%PRED-POST: 103 %
FEV6FVC-%Pred-Pre: 103 %
FVC-%Change-Post: -1 %
FVC-%Pred-Post: 75 %
FVC-%Pred-Pre: 76 %
FVC-POST: 2.16 L
FVC-Pre: 2.2 L
POST FEV6/FVC RATIO: 100 %
PRE FEV1/FVC RATIO: 83 %
Post FEV1/FVC ratio: 89 %
Pre FEV6/FVC Ratio: 100 %
RV % pred: 66 %
RV: 1.37 L
TLC % PRED: 70 %
TLC: 3.7 L

## 2017-01-13 MED ORDER — PREDNISONE 10 MG PO TABS: 10.0000 mg | ORAL_TABLET | Freq: Every day | ORAL | 1 refills | Status: DC

## 2017-01-13 MED ORDER — ALBUTEROL SULFATE HFA 108 (90 BASE) MCG/ACT IN AERS: 1.0000 | INHALATION_SPRAY | RESPIRATORY_TRACT | 3 refills | Status: DC | PRN

## 2017-01-13 NOTE — Progress Notes (Signed)
PFT done today. 

## 2017-01-13 NOTE — Progress Notes (Signed)
[name] is a very pleasant 61 year old woman with a history of stable sarcoidosis. Last seen 11/08. She is not on maintanance meds for sarcoidosis. Since last visit has had sinus surgery for sarcoid involvement [name]). Post-op course comp by MRSA, treated for eradication. Seen at [facility] regarding possible ocular sarcoidosis.    [name] 04/14/16 --  Follow-up visit for history of stage 3-4 sarcoid that has chronic sinus involvement, chronic pulmonary changes with scarring, and apparent Left upper lobe mycetoma. She has been experiencing joint pains, Has continued to do so since our last visit. Prior rheumatology evaluation was unrevealing.  B thumbs, hands. Possibly some motor involvement as well with her thumbs. She has tolerated prednisone poorly. She has been on MTX before  [name] 06/22/16 -- [name] follows up for her history of pulmonary sarcoidosis, stage 3-4, with chronic sinus involvement. She has an apparent left upper lobe mycetoma. I have been hesitant to restart her on image suppression therapy given the possibility of left upper lobe fungal involvement. I asked her to see Rheumatology again for arthralgias, neurology for a 2nd opinion regarding possible neuro - sarcoid.  She was seen by Rheum, still working on eval for possible sarcoid arthropathy. She saw neurology this am - unclear that this is neuro sarcoid. Her breathing is doing well.   [name] 08/10/16 -- follow up visit for stage 3-4 sarcoidosis with significant interstitial scar, LUL mycetoma. She was seen in the emergency department on 08/07/16 for sinus drainage, about a week of cough. Developed some R posterior flank pain. Cough was productive of foamy mucous. CT scan of her chest was done on that date that I personally reviewed. This showed no evidence of pulmonary embolism, chronic interstitial changes, stable bilateral hilar and mediastinal lymphadenopathy and a new posterior right upper lobe area of airspace disease  suspicious for PNA. She was treated with levaquin. Pain and cough are better.   [name] 10/13/16 -- history of stage 3-4 sarcoidosis with significant interstitial lung disease. She has a left upper lobe mycetoma. She unfortunately had a recent right upper lobe community-acquired pneumonia. She was treated with additional Augmentin and prednisone on 09/07/16 for persistence of symptoms, then another round of pred after. Her chest x-ray in January showed improvement in her infiltrate. She is better. Cough is almost fully resolved. No sputum. Still not back to full energy level. Has some stable exertional dyspnea.   [name] 01/13/17 -- patient has a history of sarcoidosis with significant interstitial lung disease, a left upper lobe mycetoma. Visit she experienced progressive exertional dyspnea and I started her on a course of prednisone. She subsequently had CT chest on 12/28/16 that I have personally reviewed. This shows no significant changes compared with 08/07/16 (posttreatment). She underwent pulmonary function testing today that is consistent with restrictive lung disease, no bronchodilator response, restricted lung volumes and a decreased diffusing capacity that corrects to the normal range when adjusted for alveolar volume. Comparison of this with 06/16/10 shows that the absolute FEV1 and total lung capacity have decreased in the interval but her % predicted values have remained stable for her age. She has been off the pred for about 5-6 weeks. She improved with the pred, doesn;t feel as well since stopping it - she is coughing more, frequent. Clears a lot of phlegm. She s on flonase qd, loratadine.    EXAM :  Vitals:   01/13/17 1610  BP: (!) 140/56  Pulse: 83  SpO2: 95%  Weight: 173 lb (78.5 kg)  Height: 5' 5.5" (1.664 m)   Gen: Pleasant, well-nourished, in no distress,  normal affect  ENT: No lesions,  mouth clear,  oropharynx clear, no postnasal drip, max sinus tenderness   Neck: No JVD, no TMG, no  carotid bruits  Lungs: No use of accessory muscles, no dullness to percussion, coarse BS with no wheezing   Cardiovascular: RRR, heart sounds normal, no murmur or gallops, no peripheral edema  Musculoskeletal: No deformities, no cyanosis or clubbing  Neuro: alert, non focal  Skin: Warm, no lesions or rashes  CT chest 12/28/16 --  EXAM: CT CHEST WITHOUT CONTRAST  TECHNIQUE: Multidetector CT imaging of the chest was performed following the standard protocol without intravenous contrast. High resolution imaging of the lungs, as well as inspiratory and expiratory imaging, was performed.  COMPARISON:  Multiple chest CT exams dating back to 07/13/2012.  FINDINGS: Cardiovascular: Atherosclerotic calcification of the arterial vasculature, including coronary arteries. Heart is enlarged. No pericardial effusion.  Mediastinum/Nodes: There is bulky noncalcified and calcified mediastinal and hilar adenopathy, as before. Axillary lymph nodes measure up to 1.7 cm on the left, similar. Esophagus is grossly unremarkable.  Lungs/Pleura: Bilateral upper lobe varicose and cystic bronchiectasis with architectural distortion and volume loss. 2.4 cm ovoid soft tissue lesion within an apical left upper lobe cyst is unchanged. Traction bronchiectasis and honeycombing predominate at the lung bases. Overall, the appearance of the lungs is unchanged from 08/07/2016. Mild air trapping. No pleural fluid. Airway is unremarkable.  Upper Abdomen: Visualized portions of the liver and adrenal glands are unremarkable. Low-attenuation lesion in the right kidney measures 1.6 cm and is likely a cyst. Visualized portions of the left kidney, spleen, pancreas, stomach and bowel are grossly unremarkable.  Musculoskeletal: No worrisome lytic or sclerotic lesions.  IMPRESSION: 1. Mediastinal, hilar and upper/mid lung zone predominant changes of sarcoid, as before, with a stable mycetoma in the apical  left upper lobe. 2. Basilar pulmonary parenchymal changes of usual interstitial pneumonitis. 3. Aortic atherosclerosis (ICD10-170.0). Coronary artery calcification.  CT sinuses 07/13/12 --  Comparison: 04/15/2011.  Findings: Negative visualized noncontrast brain parenchyma.  Grossly normal orbit and face soft tissues.  Visualized tympanic cavities are clear.  Visualized sphenoid sinuses are clear.  Previous maxillary antrostomies, turbinatectomies, and  ethmoidectomies. Stable mucoperiosteal thickening throughout the  maxillary and residual ethmoid sinuses.  Frontal sinuses left well visualized today but appear clear.  No acute osseous abnormality identified.  IMPRESSION:  Extensive changes of paranasal sinus surgery again noted with  chronic paranasal sinusitis, no significant change since 2012 and  no new areas of involvement.    SARCOIDOSIS We will start prednisone 10mg  daily to see if your symptoms benefit. If so we will try to taper to the lowest effective dose.  We will hold off on starting an inhaled medication at this time.  We will restart albuterol Take albuterol 1 puff up to every 4 hours if needed for shortness of breath.  Your CT scan is stable since your last scan.  Follow with Dr Lamonte Sakai in 2 months or sooner if you have any problems.  Baltazar Apo, MD, PhD 01/13/2017, 4:37 PM [facility] [phone] or if no answer [phone]

## 2017-01-13 NOTE — Telephone Encounter (Signed)
RB please advise where pt can be worked in, as there is no availability in two months. Thanks.

## 2017-01-13 NOTE — Patient Instructions (Signed)
We will start prednisone 10mg  daily to see if your symptoms benefit. If so we will try to taper to the lowest effective dose.  We will hold off on starting an inhaled medication at this time.  We will restart albuterol Take albuterol 1 puff up to every 4 hours if needed for shortness of breath.  Your CT scan is stable since your last scan.  Follow with Dr Lamonte Sakai in 2 months or sooner if you have any problems.

## 2017-01-13 NOTE — Telephone Encounter (Signed)
What is my next available?

## 2017-01-13 NOTE — Assessment & Plan Note (Signed)
We will start prednisone 10mg  daily to see if your symptoms benefit. If so we will try to taper to the lowest effective dose.  We will hold off on starting an inhaled medication at this time.  We will restart albuterol Take albuterol 1 puff up to every 4 hours if needed for shortness of breath.  Your CT scan is stable since your last scan.  Follow with Dr Lamonte Sakai in 2 months or sooner if you have any problems.

## 2017-01-13 NOTE — Telephone Encounter (Signed)
Next available appt is 04/20/2017.  Thanks!

## 2017-01-13 NOTE — Addendum Note (Signed)
Addended by: Jannette Spanner on: 01/13/2017 04:46 PM   Modules accepted: Orders

## 2017-01-19 NOTE — Telephone Encounter (Signed)
RB please advise if this appt is acceptable or do you want to double book somewhere on your schedule?  thanks

## 2017-01-20 NOTE — Telephone Encounter (Signed)
RB please advise on appt for this pt.   Thanks

## 2017-01-21 NOTE — Telephone Encounter (Signed)
RB is currently in the hospital, several messages has been sent to him, will await his response

## 2017-01-21 NOTE — Telephone Encounter (Signed)
Double book her in 2 months

## 2017-01-21 NOTE — Telephone Encounter (Signed)
lmtcb X1 for pt- need to just schedule rov in 2 months with RB- double book ok per RB.

## 2017-01-22 NOTE — Telephone Encounter (Signed)
lmomtcb x 2 for the pt.  

## 2017-01-25 NOTE — Telephone Encounter (Signed)
Called and spoke to pt. Appt made with RB on 03/15/17. Pt verbalized understanding and denied any further questions or concerns at this time.

## 2017-03-15 ENCOUNTER — Ambulatory Visit (INDEPENDENT_AMBULATORY_CARE_PROVIDER_SITE_OTHER): Payer: 59 | Admitting: Emergency Medicine

## 2017-03-15 ENCOUNTER — Encounter: Payer: Self-pay | Admitting: Emergency Medicine

## 2017-03-15 DIAGNOSIS — D869 Sarcoidosis, unspecified: Secondary | ICD-10-CM

## 2017-03-15 MED ORDER — PREDNISONE 1 MG PO TABS: ORAL_TABLET | ORAL | 0 refills | Status: DC

## 2017-03-15 NOTE — Assessment & Plan Note (Signed)
We will try decreasing your prednisone to 7mg  daily for 3 weeks. If your symptoms are stable then decrease to 5mg  after 3 weeks.  Keep track of your symptoms as we decrease the prednisone dose.  We will consider whether you may need an alternative to prednisone as we go forward.  Follow with Dr Lamonte Sakai in 2 months or sooner if you have any problems.

## 2017-03-15 NOTE — Progress Notes (Signed)
[name] is a very pleasant 61 year old woman with a history of stable sarcoidosis. Last seen 11/08. She is not on maintanance meds for sarcoidosis. Since last visit has had sinus surgery for sarcoid involvement [name]). Post-op course comp by MRSA, treated for eradication. Seen at [facility] regarding possible ocular sarcoidosis.    [name] 04/14/16 --  Follow-up visit for history of stage 3-4 sarcoid that has chronic sinus involvement, chronic pulmonary changes with scarring, and apparent Left upper lobe mycetoma. She has been experiencing joint pains, Has continued to do so since our last visit. Prior rheumatology evaluation was unrevealing.  B thumbs, hands. Possibly some motor involvement as well with her thumbs. She has tolerated prednisone poorly. She has been on MTX before  [name] 06/22/16 -- [name] follows up for her history of pulmonary sarcoidosis, stage 3-4, with chronic sinus involvement. She has an apparent left upper lobe mycetoma. I have been hesitant to restart her on image suppression therapy given the possibility of left upper lobe fungal involvement. I asked her to see Rheumatology again for arthralgias, neurology for a 2nd opinion regarding possible neuro - sarcoid.  She was seen by Rheum, still working on eval for possible sarcoid arthropathy. She saw neurology this am - unclear that this is neuro sarcoid. Her breathing is doing well.   [name] 08/10/16 -- follow up visit for stage 3-4 sarcoidosis with significant interstitial scar, LUL mycetoma. She was seen in the emergency department on 08/07/16 for sinus drainage, about a week of cough. Developed some R posterior flank pain. Cough was productive of foamy mucous. CT scan of her chest was done on that date that I personally reviewed. This showed no evidence of pulmonary embolism, chronic interstitial changes, stable bilateral hilar and mediastinal lymphadenopathy and a new posterior right upper lobe area of airspace disease  suspicious for PNA. She was treated with levaquin. Pain and cough are better.   [name] 10/13/16 -- history of stage 3-4 sarcoidosis with significant interstitial lung disease. She has a left upper lobe mycetoma. She unfortunately had a recent right upper lobe community-acquired pneumonia. She was treated with additional Augmentin and prednisone on 09/07/16 for persistence of symptoms, then another round of pred after. Her chest x-ray in January showed improvement in her infiltrate. She is better. Cough is almost fully resolved. No sputum. Still not back to full energy level. Has some stable exertional dyspnea.   [name] 01/13/17 -- patient has a history of sarcoidosis with significant interstitial lung disease, a left upper lobe mycetoma. Visit she experienced progressive exertional dyspnea and I started her on a course of prednisone. She subsequently had CT chest on 12/28/16 that I have personally reviewed. This shows no significant changes compared with 08/07/16 (posttreatment). She underwent pulmonary function testing today that is consistent with restrictive lung disease, no bronchodilator response, restricted lung volumes and a decreased diffusing capacity that corrects to the normal range when adjusted for alveolar volume. Comparison of this with 06/16/10 shows that the absolute FEV1 and total lung capacity have decreased in the interval but her % predicted values have remained stable for her age. She has been off the pred for about 5-6 weeks. She improved with the pred, doesn;t feel as well since stopping it - she is coughing more, frequent. Clears a lot of phlegm. She s on flonase qd, loratadine.   [name] 03/15/17 -- patient follows up today for her sarcoidosis and associated interstitial lung disease. She also has a left upper lobe mycetoma. She has  associated restrictive lung disease. At her last visit we restarted prednisone 10 mg to see if she would benefit. She reports that she is having less joint pain, feels  that her breathing may be a bit better. Still having cough, has facail fullness especially when she misses [name]. She does this reliably. No rash, no LAD. Loratadine. Her CBG are slightly elevated per her PCP on the prednisone.    EXAM :  Vitals:   03/15/17 1327  BP: 138/80  Pulse: 70  SpO2: 99%  Weight: 174 lb 6.4 oz (79.1 kg)  Height: 5\' 5"  (1.651 m)   Gen: Pleasant, well-nourished, in no distress,  normal affect  ENT: No lesions,  mouth clear,  oropharynx clear, no postnasal drip,   Neck: No JVD, no TMG, no carotid bruits  Lungs: No use of accessory muscles, no Wheeze or crackles  Cardiovascular: RRR, heart sounds normal, no murmur or gallops, no peripheral edema  Musculoskeletal: No deformities, no cyanosis or clubbing  Neuro: alert, non focal  Skin: Warm, no lesions or rashes  CT chest 12/28/16 --  EXAM: CT CHEST WITHOUT CONTRAST  TECHNIQUE: Multidetector CT imaging of the chest was performed following the standard protocol without intravenous contrast. High resolution imaging of the lungs, as well as inspiratory and expiratory imaging, was performed.  COMPARISON:  Multiple chest CT exams dating back to 07/13/2012.  FINDINGS: Cardiovascular: Atherosclerotic calcification of the arterial vasculature, including coronary arteries. Heart is enlarged. No pericardial effusion.  Mediastinum/Nodes: There is bulky noncalcified and calcified mediastinal and hilar adenopathy, as before. Axillary lymph nodes measure up to 1.7 cm on the left, similar. Esophagus is grossly unremarkable.  Lungs/Pleura: Bilateral upper lobe varicose and cystic bronchiectasis with architectural distortion and volume loss. 2.4 cm ovoid soft tissue lesion within an apical left upper lobe cyst is unchanged. Traction bronchiectasis and honeycombing predominate at the lung bases. Overall, the appearance of the lungs is unchanged from 08/07/2016. Mild air trapping. No pleural fluid. Airway  is unremarkable.  Upper Abdomen: Visualized portions of the liver and adrenal glands are unremarkable. Low-attenuation lesion in the right kidney measures 1.6 cm and is likely a cyst. Visualized portions of the left kidney, spleen, pancreas, stomach and bowel are grossly unremarkable.  Musculoskeletal: No worrisome lytic or sclerotic lesions.  IMPRESSION: 1. Mediastinal, hilar and upper/mid lung zone predominant changes of sarcoid, as before, with a stable mycetoma in the apical left upper lobe. 2. Basilar pulmonary parenchymal changes of usual interstitial pneumonitis. 3. Aortic atherosclerosis (ICD10-170.0). Coronary artery calcification.  CT sinuses 07/13/12 --  Comparison: 04/15/2011.  Findings: Negative visualized noncontrast brain parenchyma.  Grossly normal orbit and face soft tissues.  Visualized tympanic cavities are clear.  Visualized sphenoid sinuses are clear.  Previous maxillary antrostomies, turbinatectomies, and  ethmoidectomies. Stable mucoperiosteal thickening throughout the  maxillary and residual ethmoid sinuses.  Frontal sinuses left well visualized today but appear clear.  No acute osseous abnormality identified.  IMPRESSION:  Extensive changes of paranasal sinus surgery again noted with  chronic paranasal sinusitis, no significant change since 2012 and  no new areas of involvement.    SARCOIDOSIS We will try decreasing your prednisone to 7mg  daily for 3 weeks. If your symptoms are stable then decrease to 5mg  after 3 weeks.  Keep track of your symptoms as we decrease the prednisone dose.  We will consider whether you may need an alternative to prednisone as we go forward.  Follow with Dr Lamonte Sakai in 2 months or sooner if you have any  problems.  Baltazar Apo, MD, PhD 03/15/2017, 1:46 PM [facility] [phone] or if no answer [phone]

## 2017-03-15 NOTE — Addendum Note (Signed)
Addended by: Maryanna Shape A on: 03/15/2017 02:00 PM   Modules accepted: Orders

## 2017-03-15 NOTE — Patient Instructions (Signed)
We will try decreasing your prednisone to 7mg  daily for 3 weeks. If your symptoms are stable then decrease to 5mg  after 3 weeks.  Keep track of your symptoms as we decrease the prednisone dose.  We will consider whether you may need an alternative to prednisone as we go forward.  Follow with Dr Lamonte Sakai in 2 months or sooner if you have any problems.

## 2017-05-12 ENCOUNTER — Telehealth: Payer: Self-pay | Admitting: Emergency Medicine

## 2017-05-12 NOTE — Telephone Encounter (Signed)
Have her increase prednisone to 15 mg daily and schedule [name] with NP or Dr. Lamonte Sakai.

## 2017-05-12 NOTE — Telephone Encounter (Signed)
Pt is aware of VS's recommendations and voiced her understanding. Pt has been scheduled for OV with RB on 05/17/17. Nothing further needed.

## 2017-05-12 NOTE — Telephone Encounter (Signed)
Called and spoke with pt. Pt reports of deep non prod cough, chest tightness & voice hoariness . Cough worsens at night with laying flat. Pt states she coughs to the point she vomits. Pt states sx have worsen over the past week but have been present x42mo.   Sob is stable.  Currently on 5mg  daily.   VS please advise, as RB is unavailable. Thanks.

## 2017-05-17 ENCOUNTER — Ambulatory Visit (INDEPENDENT_AMBULATORY_CARE_PROVIDER_SITE_OTHER): Payer: 59 | Admitting: Emergency Medicine

## 2017-05-17 ENCOUNTER — Encounter: Payer: Self-pay | Admitting: Emergency Medicine

## 2017-05-17 DIAGNOSIS — R05 Cough: Secondary | ICD-10-CM

## 2017-05-17 DIAGNOSIS — Z23 Encounter for immunization: Secondary | ICD-10-CM | POA: Diagnosis not present

## 2017-05-17 DIAGNOSIS — D869 Sarcoidosis, unspecified: Secondary | ICD-10-CM | POA: Diagnosis not present

## 2017-05-17 DIAGNOSIS — R059 Cough, unspecified: Secondary | ICD-10-CM

## 2017-05-17 MED ORDER — BUDESONIDE-FORMOTEROL FUMARATE 160-4.5 MCG/ACT IN AERO: 2.0000 | INHALATION_SPRAY | Freq: Two times a day (BID) | RESPIRATORY_TRACT | 11 refills | Status: DC

## 2017-05-17 MED ORDER — HYDROCODONE-HOMATROPINE 5-1.5 MG/5ML PO SYRP: 5.0000 mL | ORAL_SOLUTION | Freq: Four times a day (QID) | ORAL | 0 refills | Status: DC | PRN

## 2017-05-17 MED ORDER — BUDESONIDE-FORMOTEROL FUMARATE 160-4.5 MCG/ACT IN AERO: 2.0000 | INHALATION_SPRAY | Freq: Two times a day (BID) | RESPIRATORY_TRACT | 0 refills | Status: DC

## 2017-05-17 MED ORDER — PANTOPRAZOLE SODIUM 40 MG PO TBEC: 40.0000 mg | DELAYED_RELEASE_TABLET | Freq: Every day | ORAL | 2 refills | Status: DC

## 2017-05-17 NOTE — Patient Instructions (Addendum)
We will start pantoprazole 40mg  daily. Take either 1 hour before or 1 hour after eating We will decrease your prednisone back to 5mg  daily We will start symbicort 160 mcg, 2 puffs twice a day. Remember to rinse and gargle after using.  Use hycodan 5cc up to every 6 hours as needed for cough suppression.  Please follow with Dr Lamonte Sakai next available to review your status with these changes.

## 2017-05-17 NOTE — Assessment & Plan Note (Signed)
We will start pantoprazole 40mg  daily. Take either 1 hour before or 1 hour after eating Use hycodan 5cc up to every 6 hours as needed for cough suppression.  Please follow with Dr Lamonte Sakai next available to review your status with these changes.

## 2017-05-17 NOTE — Addendum Note (Signed)
Addended by: Maryanna Shape A on: 05/17/2017 05:29 PM   Modules accepted: Orders

## 2017-05-17 NOTE — Addendum Note (Signed)
Addended by: Maryanna Shape A on: 05/17/2017 05:09 PM   Modules accepted: Orders

## 2017-05-17 NOTE — Assessment & Plan Note (Signed)
We will start pantoprazole 40mg  daily. Take either 1 hour before or 1 hour after eating We will decrease your prednisone back to 5mg  daily We will start symbicort 160 mcg, 2 puffs twice a day. Remember to rinse and gargle after using.  Use hycodan 5cc up to every 6 hours as needed for cough suppression.  Please follow with Dr Lamonte Sakai next available to review your status with these changes.

## 2017-05-17 NOTE — Progress Notes (Signed)
[name] is a very pleasant 61 year old woman with a history of stable sarcoidosis. Last seen 11/08. She is not on maintanance meds for sarcoidosis. Since last visit has had sinus surgery for sarcoid involvement [name]). Post-op course comp by MRSA, treated for eradication. 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    [name] 03/15/17 -- patient follows up today for her sarcoidosis and associated interstitial lung disease. She also has a left upper lobe mycetoma. She has associated restrictive lung disease. At her last visit we restarted prednisone 10 mg to see if she would benefit. She reports that she is having less joint pain, feels that her breathing may be a bit better. Still having cough, has facail fullness especially when she misses [name]. She does this reliably. No rash, no LAD. Loratadine. Her CBG are slightly elevated per her PCP on the prednisone.   [name] 05/17/17 -- this follow-up visit for patient with a history of sarcoidosis and significant interstitial lung disease. She has a left upper lobe mycetoma. Also with a history of chronic cough, allergic rhinitis. We have been trying to wean prednisone, had made it down to 5mg  daily. Her cough progressed as we worked the pred down. Back to 15mg . Happens mainly at night when she is supine.     EXAM :  Vitals:   05/17/17 1619  BP: 122/80  Pulse: 87  SpO2: 98%  Weight: 171 lb 3.2 oz (77.7 kg)  Height: 5' 5.5" (1.664 m)   Gen: Pleasant, well-nourished, in no distress,  normal affect  ENT: No lesions,  mouth clear,  oropharynx clear, no postnasal drip,   Neck: No JVD, no TMG, no carotid bruits  Lungs: No use of accessory muscles, no Wheeze or crackles  Cardiovascular: RRR, heart sounds normal, no murmur or gallops, no peripheral edema  Musculoskeletal: No deformities, no cyanosis or clubbing  Neuro: alert, non focal  Skin: Warm, no lesions or rashes  CT chest 12/28/16 --  EXAM: CT CHEST WITHOUT  CONTRAST  TECHNIQUE: Multidetector CT imaging of the chest was performed following the standard protocol without intravenous contrast. High resolution imaging of the lungs, as well as inspiratory and expiratory imaging, was performed.  COMPARISON:  Multiple chest CT exams dating back to 07/13/2012.  FINDINGS: Cardiovascular: Atherosclerotic calcification of the arterial vasculature, including coronary arteries. Heart is enlarged. No pericardial effusion.  Mediastinum/Nodes: There is bulky noncalcified and calcified mediastinal and hilar adenopathy, as before. Axillary lymph nodes measure up to 1.7 cm on the left, similar. Esophagus is grossly unremarkable.  Lungs/Pleura: Bilateral upper lobe varicose and cystic bronchiectasis with architectural distortion and volume loss. 2.4 cm ovoid soft tissue lesion within an apical left upper lobe cyst is unchanged. Traction bronchiectasis and honeycombing predominate at the lung bases. Overall, the appearance of the lungs is unchanged from 08/07/2016. Mild air trapping. No pleural fluid. Airway is unremarkable.  Upper Abdomen: Visualized portions of the liver and adrenal glands are unremarkable. Low-attenuation lesion in the right kidney measures 1.6 cm and is likely a cyst. Visualized portions of the left kidney, spleen, pancreas, stomach and bowel are grossly unremarkable.  Musculoskeletal: No worrisome lytic or sclerotic lesions.  IMPRESSION: 1. Mediastinal, hilar and upper/mid lung zone predominant changes of sarcoid, as before, with a stable mycetoma in the apical left upper lobe. 2. Basilar pulmonary parenchymal changes of usual interstitial pneumonitis. 3. Aortic atherosclerosis (ICD10-170.0). Coronary artery calcification.  CT sinuses 07/13/12 --  Comparison: 04/15/2011.  Findings: Negative visualized noncontrast brain parenchyma.  Grossly  normal orbit and face soft tissues.  Visualized tympanic cavities are  clear.  Visualized sphenoid sinuses are clear.  Previous maxillary antrostomies, turbinatectomies, and  ethmoidectomies. Stable mucoperiosteal thickening throughout the  maxillary and residual ethmoid sinuses.  Frontal sinuses left well visualized today but appear clear.  No acute osseous abnormality identified.  IMPRESSION:  Extensive changes of paranasal sinus surgery again noted with  chronic paranasal sinusitis, no significant change since 2012 and  no new areas of involvement.    SARCOIDOSIS We will start pantoprazole 40mg  daily. Take either 1 hour before or 1 hour after eating We will decrease your prednisone back to 5mg  daily We will start symbicort 160 mcg, 2 puffs twice a day. Remember to rinse and gargle after using.  Use hycodan 5cc up to every 6 hours as needed for cough suppression.  Please follow with Dr Lamonte Sakai next available to review your status with these changes.   COUGH We will start pantoprazole 40mg  daily. Take either 1 hour before or 1 hour after eating Use hycodan 5cc up to every 6 hours as needed for cough suppression.  Please follow with Dr Lamonte Sakai next available to review your status with these changes.   Baltazar Apo, MD, PhD 05/17/2017, 4:58 PM  Pulmonary and [facility] Care [phone] or if no answer [phone]

## 2017-05-28 ENCOUNTER — Other Ambulatory Visit: Payer: Self-pay | Admitting: *Deleted

## 2017-05-28 ENCOUNTER — Ambulatory Visit: Payer: 59 | Admitting: Emergency Medicine

## 2017-05-28 MED ORDER — ALBUTEROL SULFATE HFA 108 (90 BASE) MCG/ACT IN AERS: 1.0000 | INHALATION_SPRAY | RESPIRATORY_TRACT | 1 refills | Status: DC | PRN

## 2017-05-28 MED ORDER — BUDESONIDE-FORMOTEROL FUMARATE 160-4.5 MCG/ACT IN AERO: 2.0000 | INHALATION_SPRAY | Freq: Two times a day (BID) | RESPIRATORY_TRACT | 1 refills | Status: DC

## 2017-05-28 MED ORDER — PANTOPRAZOLE SODIUM 40 MG PO TBEC: 40.0000 mg | DELAYED_RELEASE_TABLET | Freq: Every day | ORAL | 1 refills | Status: DC

## 2017-05-28 MED ORDER — PREDNISONE 5 MG PO TABS: 5.0000 mg | ORAL_TABLET | Freq: Every day | ORAL | 1 refills | Status: DC

## 2017-06-15 ENCOUNTER — Ambulatory Visit (INDEPENDENT_AMBULATORY_CARE_PROVIDER_SITE_OTHER): Payer: 59 | Admitting: Emergency Medicine

## 2017-06-15 ENCOUNTER — Encounter: Payer: Self-pay | Admitting: Emergency Medicine

## 2017-06-15 DIAGNOSIS — R05 Cough: Secondary | ICD-10-CM

## 2017-06-15 DIAGNOSIS — D869 Sarcoidosis, unspecified: Secondary | ICD-10-CM

## 2017-06-15 DIAGNOSIS — R059 Cough, unspecified: Secondary | ICD-10-CM

## 2017-06-15 MED ORDER — HYDROCODONE-HOMATROPINE 5-1.5 MG/5ML PO SYRP: 5.0000 mL | ORAL_SOLUTION | Freq: Four times a day (QID) | ORAL | 0 refills | Status: DC | PRN

## 2017-06-15 NOTE — Assessment & Plan Note (Addendum)
She continues to have severe dry cough.  I have treated her for rhinitis, GERD without significant improvement.  I question whether this may be related to her sarcoid.  One confounding issue is that I also started Symbicort last time which could be an irritant to her upper airway.  Please stop Symbicort now Continue prednisone 5 mg daily Continue loratadine 10 mg daily Continue pantoprazole 40 mg once a day

## 2017-06-15 NOTE — Assessment & Plan Note (Signed)
Question whether her sarcoidosis and or opportunistic infection are contributing to her chronic cough.  She needs a repeat CT scan to compare with priors.  Based on the results we will determine whether she needs bronchoscopy versus increased prednisone, etc.  Will repeat a CT scan of the chest with contrast to evaluate for sarcoidosis Please stop Symbicort now Continue prednisone 5 mg daily Depending on your CT scan results we will likely decide to increase her prednisone for period of time Use Hycodan 5 cc up to every 6 hours if needed for coughing Follow with Dr Lamonte Sakai in 1 month or next available

## 2017-06-15 NOTE — Progress Notes (Signed)
[name] is a very pleasant 61 year old woman with a history of stable sarcoidosis. Last seen 11/08. She is not on maintanance meds for sarcoidosis. Since last visit has had sinus surgery for sarcoid involvement [name]). Post-op course comp by MRSA, treated for eradication. Seen at [facility] 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    [name] 03/15/17 -- patient follows up today for her sarcoidosis and associated interstitial lung disease. She also has a left upper lobe mycetoma. She has associated restrictive lung disease. At her last visit we restarted prednisone 10 mg to see if she would benefit. She reports that she is having less joint pain, feels that her breathing may be a bit better. Still having cough, has facail fullness especially when she misses [name]. She does this reliably. No rash, no LAD. Loratadine. Her CBG are slightly elevated per her PCP on the prednisone.   [name] 05/17/17 -- this follow-up visit for patient with a history of sarcoidosis and significant interstitial lung disease. She has a left upper lobe mycetoma. Also with a history of chronic cough, allergic rhinitis. We have been trying to wean prednisone, had made it down to 5mg  daily. Her cough progressed as we worked the pred down. Back to 15mg . Happens mainly at night when she is supine.   [name] 06/15/17 --this is a follow-up visit for sarcoidosis with associated interstitial lung disease.  She also has a presumed left upper lobe mycetoma.  She deals with chronic cough in the setting of allergic rhinitis.  At her last visit I started pantoprazole to see if she would benefit.  I also decreased her prednisone back to 5 mg daily, started Symbicort twice a day. She has not had any changes in breathing. Biggest issue is persistent cough > no change with the above. Non-productive. Some mild benefit from the hycodan.    EXAM :  Vitals:   06/15/17 0948  BP: 126/80  Pulse: 75  SpO2: 96%  Weight: 173 lb (78.5 kg)  Height: 5\' 5"   (1.651 m)   Gen: Pleasant, well-nourished, in no distress,  normal affect  ENT: No lesions,  mouth clear,  oropharynx clear, no postnasal drip,   Neck: No JVD, no TMG, no carotid bruits  Lungs: No use of accessory muscles, no Wheeze or crackles  Cardiovascular: RRR, heart sounds normal, no murmur or gallops, no peripheral edema  Musculoskeletal: No deformities, no cyanosis or clubbing  Neuro: alert, non focal  Skin: Warm, no lesions or rashes  CT chest 12/28/16 --  EXAM: CT CHEST WITHOUT CONTRAST  TECHNIQUE: Multidetector CT imaging of the chest was performed following the standard protocol without intravenous contrast. High resolution imaging of the lungs, as well as inspiratory and expiratory imaging, was performed.  COMPARISON:  Multiple chest CT exams dating back to 07/13/2012.  FINDINGS: Cardiovascular: Atherosclerotic calcification of the arterial vasculature, including coronary arteries. Heart is enlarged. No pericardial effusion.  Mediastinum/Nodes: There is bulky noncalcified and calcified mediastinal and hilar adenopathy, as before. Axillary lymph nodes measure up to 1.7 cm on the left, similar. Esophagus is grossly unremarkable.  Lungs/Pleura: Bilateral upper lobe varicose and cystic bronchiectasis with architectural distortion and volume loss. 2.4 cm ovoid soft tissue lesion within an apical left upper lobe cyst is unchanged. Traction bronchiectasis and honeycombing predominate at the lung bases. Overall, the appearance of the lungs is unchanged from 08/07/2016. Mild air trapping. No pleural fluid. Airway is unremarkable.  Upper Abdomen: Visualized portions of the liver and adrenal glands are unremarkable. Low-attenuation lesion in  the right kidney measures 1.6 cm and is likely a cyst. Visualized portions of the left kidney, spleen, pancreas, stomach and bowel are grossly unremarkable.  Musculoskeletal: No worrisome lytic or sclerotic  lesions.  IMPRESSION: 1. Mediastinal, hilar and upper/mid lung zone predominant changes of sarcoid, as before, with a stable mycetoma in the apical left upper lobe. 2. Basilar pulmonary parenchymal changes of usual interstitial pneumonitis. 3. Aortic atherosclerosis (ICD10-170.0). Coronary artery calcification.  CT sinuses 07/13/12 --  Comparison: 04/15/2011.  Findings: Negative visualized noncontrast brain parenchyma.  Grossly normal orbit and face soft tissues.  Visualized tympanic cavities are clear.  Visualized sphenoid sinuses are clear.  Previous maxillary antrostomies, turbinatectomies, and  ethmoidectomies. Stable mucoperiosteal thickening throughout the  maxillary and residual ethmoid sinuses.  Frontal sinuses left well visualized today but appear clear.  No acute osseous abnormality identified.  IMPRESSION:  Extensive changes of paranasal sinus surgery again noted with  chronic paranasal sinusitis, no significant change since 2012 and  no new areas of involvement.    COUGH She continues to have severe dry cough.  I have treated her for rhinitis, GERD without significant improvement.  I question whether this may be related to her sarcoid.  One confounding issue is that I also started Symbicort last time which could be an irritant to her upper airway.  Please stop Symbicort now Continue prednisone 5 mg daily Continue loratadine 10 mg daily Continue pantoprazole 40 mg once a day  SARCOIDOSIS Question whether her sarcoidosis and or opportunistic infection are contributing to her chronic cough.  She needs a repeat CT scan to compare with priors.  Based on the results we will determine whether she needs bronchoscopy versus increased prednisone, etc.  Will repeat a CT scan of the chest with contrast to evaluate for sarcoidosis Please stop Symbicort now Continue prednisone 5 mg daily Depending on your CT scan results we will likely decide to increase her prednisone for  period of time Use Hycodan 5 cc up to every 6 hours if needed for coughing Follow with Dr Lamonte Sakai in 1 month or next available  Baltazar Apo, MD, PhD 06/15/2017, 10:22 AM [facility] [phone] or if no answer [phone]

## 2017-06-15 NOTE — Patient Instructions (Signed)
Will repeat a CT scan of the chest with contrast to evaluate for sarcoidosis Please stop Symbicort now Continue prednisone 5 mg daily Continue loratadine 10 mg daily Continue pantoprazole 40 mg once a day Depending on your CT scan results we will likely decide to increase her prednisone for period of time Use Hycodan 5 cc up to every 6 hours if needed for coughing Follow with Dr Lamonte Sakai in 1 month or next available

## 2017-06-18 ENCOUNTER — Ambulatory Visit (INDEPENDENT_AMBULATORY_CARE_PROVIDER_SITE_OTHER)
Admission: RE | Admit: 2017-06-18 | Discharge: 2017-06-18 | Disposition: A | Discharge status: Home / Self Care | Payer: 59 | Source: Ambulatory Visit | Attending: Emergency Medicine | Admitting: Emergency Medicine

## 2017-06-18 DIAGNOSIS — D869 Sarcoidosis, unspecified: Secondary | ICD-10-CM

## 2017-06-18 MED ORDER — IOPAMIDOL (ISOVUE-300) INJECTION 61%
80.0000 mL | Freq: Once | INTRAVENOUS | Status: AC | PRN
  Administered 2017-06-18: 80 mL via INTRAVENOUS

## 2017-07-22 ENCOUNTER — Encounter: Payer: Self-pay | Admitting: Emergency Medicine

## 2017-07-22 ENCOUNTER — Ambulatory Visit (INDEPENDENT_AMBULATORY_CARE_PROVIDER_SITE_OTHER): Payer: 59 | Admitting: Emergency Medicine

## 2017-07-22 DIAGNOSIS — R05 Cough: Secondary | ICD-10-CM | POA: Diagnosis not present

## 2017-07-22 DIAGNOSIS — D869 Sarcoidosis, unspecified: Secondary | ICD-10-CM | POA: Diagnosis not present

## 2017-07-22 DIAGNOSIS — R059 Cough, unspecified: Secondary | ICD-10-CM

## 2017-07-22 DIAGNOSIS — B479 Mycetoma, unspecified: Secondary | ICD-10-CM | POA: Diagnosis not present

## 2017-07-22 MED ORDER — HYDROCODONE-HOMATROPINE 5-1.5 MG/5ML PO SYRP
5.0000 mL | ORAL_SOLUTION | Freq: Four times a day (QID) | ORAL | 0 refills | Status: DC | PRN
Start: 2017-07-22 — End: 2018-07-18

## 2017-07-22 MED ORDER — PREDNISONE 10 MG PO TABS: 30.0000 mg | ORAL_TABLET | Freq: Every day | ORAL | 1 refills | Status: DC

## 2017-07-22 NOTE — H&P (View-Only) (Signed)
[name] is a very pleasant 61 year old woman with a history of sarcoidosis. Last seen 11/08. She is not on maintanance meds for sarcoidosis. Since last visit has had sinus surgery for sarcoid involvement [name]). Post-op course comp by MRSA, treated for eradication. 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    [name] 03/15/17 -- patient follows up today for her sarcoidosis and associated interstitial lung disease. She also has a left upper lobe mycetoma. She has associated restrictive lung disease. At her last visit we restarted prednisone 10 mg to see if she would benefit. She reports that she is having less joint pain, feels that her breathing may be a bit better. Still having cough, has facail fullness especially when she misses [name]. She does this reliably. No rash, no LAD. Loratadine. Her CBG are slightly elevated per her PCP on the prednisone.   [name] 05/17/17 -- this follow-up visit for patient with a history of sarcoidosis and significant interstitial lung disease. She has a left upper lobe mycetoma. Also with a history of chronic cough, allergic rhinitis. We have been trying to wean prednisone, had made it down to 71m daily. Her cough progressed as we worked the pred down. Back to 164m Happens mainly at night when she is supine.   [name] 06/15/17 --this is a follow-up visit for sarcoidosis with associated interstitial lung disease.  She also has a presumed left upper lobe mycetoma.  She deals with chronic cough in the setting of allergic rhinitis.  At her last visit I started pantoprazole to see if she would benefit.  I also decreased her prednisone back to 5 mg daily, started Symbicort twice a day. She has not had any changes in breathing. Biggest issue is persistent cough > no change with the above. Non-productive. Some mild benefit from the hycodan.   [name] 07/22/17 --this is a follow-up visit for [age]ear old woman with a history of sarcoidosis, associated significant  interstitial lung disease.  Her ILD is complicated by presumed left upper lobe mycetoma.  She has chronic cough and allergic rhinitis.  At her last visit she continued to have dyspnea, cough.  I stopped her Symbicort, continued prednisone, loratadine, Protonix.  We repeated her CT scan on 06/18/17 that I have reviewed.  This shows diffuse upper lobe bullous change and moderate honeycombing that is stable there is some increased patchy nodular thickening in the peripheral peribronchovascular interstitium in the right middle lobe.  The mycetoma appears to be stable.  She has non-stop cough, feels exhausted, has hoarse voice, head congestion.    EXAM :  Vitals:   07/22/17 1629  BP: (!) 128/96  Pulse: 82  SpO2: 96%  Weight: 170 lb (77.1 kg)  Height: _0  (1.651 m)   Gen: Pleasant, well-nourished, in no distress,  normal affect  ENT: No lesions,  mouth clear,  oropharynx clear, no postnasal drip,   Neck: No JVD, no TMG, no carotid bruits  Lungs: No use of accessory muscles, no Wheeze or crackles  Cardiovascular: RRR, heart sounds normal, no murmur or gallops, no peripheral edema  Musculoskeletal: No deformities, no cyanosis or clubbing  Neuro: alert, non focal  Skin: Warm, no lesions or rashes    CT  chest 06/18/17 --  COMPARISON:  12/28/2016 high-resolution chest CT.  FINDINGS: Cardiovascular: Top-normal heart size. No significant pericardial fluid/thickening. Left anterior descending coronary atherosclerosis. Mildly atherosclerotic nonaneurysmal thoracic aorta. Normal caliber pulmonary arteries. No central pulmonary emboli.  Mediastinum/Nodes: No discrete thyroid nodules. Unremarkable esophagus. Enlarged 2.0  cm left axillary node (series 2/ image 18), previously 1.7 cm on 12/28/2016, mildly increased. No pathologically enlarged right axillary nodes. Right paratracheal adenopathy measuring up to 1.9 cm, stable. Enlarged subcarinal 2.6 cm node (series 2/ image 56), stable  using similar measurement technique. Enlarged 1.1 cm coarsely calcified AP window node, stable. Stable coarsely calcified mildly enlarged bilateral hilar nodes.  Lungs/Pleura: No pneumothorax. No pleural effusion. No acute consolidative airspace disease. There is moderate cylindrical, varicoid and cystic bronchiectasis scattered in both lungs with associated parenchymal distortion, most prominent in the upper lobes with bullous changes in the upper lobes, not appreciably changed. There is a stable 2.5 cm mycetoma in the dominant apical left upper lobe bulla (series 3/image 8). There is moderate honeycombing throughout both lung bases, not appreciably changed. There is increased patchy nodular thickening of the peripheral peribronchovascular interstitium in the right middle lobe (series 3/ images 43-46).  Upper abdomen: Simple 1.9 cm upper right renal cyst.  Musculoskeletal: No aggressive appearing focal osseous lesions. Mild thoracic spondylosis.  IMPRESSION: 1. Spectrum of findings compatible with fibrotic interstitial lung disease due to pulmonary sarcoidosis, including basilar predominant honeycombing and apical predominant bullous change. Findings are largely stable since 12/28/2016, with mildly increased nodular thickening of the peripheral peribronchovascular interstitium in the right middle lobe. Stable apical left upper lobe mycetoma. 2. Stable left axillary, mediastinal and bilateral hilar adenopathy compatible with sarcoidosis. 3. One vessel coronary atherosclerosis.  Aortic Atherosclerosis (ICD10-I70.0).   COUGH Not entirely clear to me that this is related to her sarcoid but certainly it could be.  Her CT scan of the chest does show some possible increased infiltrate or groundglass superimposed on known honeycomb change.  She has been treated for rhinitis, treated for GERD.  I believe at this point we will treat her for sarcoid as detailed below.  She also needs  bronchoscopy for airway inspection, BAL for cytology and also cell count, eosinophil count.  We will work on scheduling a bronchoscopy in the near future.  Refill her Hycodan  SARCOIDOSIS Unclear to me based on subtle CT scan changes that this actually reflects a flare of her sarcoidosis, but clearly her symptoms are profound.  Coughing is refractory to treatment for, and exacerbators.  I believe at this point we need to treat her for active sarcoidosis, prednisone 30 mg daily for at least 1 month and then decrease.  Must consider the potential risk of activation of her mycetoma, weigh this versus the benefits of prednisone for her sarcoid.   Mycetoma Stable in size on her recent CT scan of the chest  Baltazar Apo, MD, PhD 07/22/2017, 5:20 PM [facility] [phone] or if no answer [phone]

## 2017-07-22 NOTE — Assessment & Plan Note (Signed)
Unclear to me based on subtle CT scan changes that this actually reflects a flare of her sarcoidosis, but clearly her symptoms are profound.  Coughing is refractory to treatment for, and exacerbators.  I believe at this point we need to treat her for active sarcoidosis, prednisone 30 mg daily for at least 1 month and then decrease.  Must consider the potential risk of activation of her mycetoma, weigh this versus the benefits of prednisone for her sarcoid.

## 2017-07-22 NOTE — Patient Instructions (Signed)
We will arrange for a bronchoscopy to better evaluate your cough and chest CT scan findings Please start prednisone 30 mg daily.  We will plan to take this until your follow-up visit, at least one month. Please continue pantoprazole and loratadine as you have been taking them Follow with Dr Lamonte Sakai in 1 month

## 2017-07-22 NOTE — Assessment & Plan Note (Addendum)
Not entirely clear to me that this is related to her sarcoid but certainly it could be.  Her CT scan of the chest does show some possible increased infiltrate or groundglass superimposed on known honeycomb change.  She has been treated for rhinitis, treated for GERD.  I believe at this point we will treat her for sarcoid as detailed below.  She also needs bronchoscopy for airway inspection, BAL for cytology and also cell count, eosinophil count.  We will work on scheduling a bronchoscopy in the near future.  Refill her Hycodan

## 2017-07-22 NOTE — Progress Notes (Signed)
[name] is a very pleasant 61 year old woman with a history of sarcoidosis. Last seen 11/08. She is not on maintanance meds for sarcoidosis. Since last visit has had sinus surgery for sarcoid involvement [name]). Post-op course comp by MRSA, treated for eradication. 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    [name] 03/15/17 -- patient follows up today for her sarcoidosis and associated interstitial lung disease. She also has a left upper lobe mycetoma. She has associated restrictive lung disease. At her last visit we restarted prednisone 10 mg to see if she would benefit. She reports that she is having less joint pain, feels that her breathing may be a bit better. Still having cough, has facail fullness especially when she misses [name]. She does this reliably. No rash, no LAD. Loratadine. Her CBG are slightly elevated per her PCP on the prednisone.   [name] 05/17/17 -- this follow-up visit for patient with a history of sarcoidosis and significant interstitial lung disease. She has a left upper lobe mycetoma. Also with a history of chronic cough, allergic rhinitis. We have been trying to wean prednisone, had made it down to 7m daily. Her cough progressed as we worked the pred down. Back to 135m Happens mainly at night when she is supine.   [name] 06/15/17 --this is a follow-up visit for sarcoidosis with associated interstitial lung disease.  She also has a presumed left upper lobe mycetoma.  She deals with chronic cough in the setting of allergic rhinitis.  At her last visit I started pantoprazole to see if she would benefit.  I also decreased her prednisone back to 5 mg daily, started Symbicort twice a day. She has not had any changes in breathing. Biggest issue is persistent cough > no change with the above. Non-productive. Some mild benefit from the hycodan.   [name] 07/22/17 --this is a follow-up visit for [age]ear old woman with a history of sarcoidosis, associated significant  interstitial lung disease.  Her ILD is complicated by presumed left upper lobe mycetoma.  She has chronic cough and allergic rhinitis.  At her last visit she continued to have dyspnea, cough.  I stopped her Symbicort, continued prednisone, loratadine, Protonix.  We repeated her CT scan on 06/18/17 that I have reviewed.  This shows diffuse upper lobe bullous change and moderate honeycombing that is stable there is some increased patchy nodular thickening in the peripheral peribronchovascular interstitium in the right middle lobe.  The mycetoma appears to be stable.  She has non-stop cough, feels exhausted, has hoarse voice, head congestion.    EXAM :  Vitals:   07/22/17 1629  BP: (!) 128/96  Pulse: 82  SpO2: 96%  Weight: 170 lb (77.1 kg)  Height: _0  (1.651 m)   Gen: Pleasant, well-nourished, in no distress,  normal affect  ENT: No lesions,  mouth clear,  oropharynx clear, no postnasal drip,   Neck: No JVD, no TMG, no carotid bruits  Lungs: No use of accessory muscles, no Wheeze or crackles  Cardiovascular: RRR, heart sounds normal, no murmur or gallops, no peripheral edema  Musculoskeletal: No deformities, no cyanosis or clubbing  Neuro: alert, non focal  Skin: Warm, no lesions or rashes    CT  chest 06/18/17 --  COMPARISON:  12/28/2016 high-resolution chest CT.  FINDINGS: Cardiovascular: Top-normal heart size. No significant pericardial fluid/thickening. Left anterior descending coronary atherosclerosis. Mildly atherosclerotic nonaneurysmal thoracic aorta. Normal caliber pulmonary arteries. No central pulmonary emboli.  Mediastinum/Nodes: No discrete thyroid nodules. Unremarkable esophagus. Enlarged 2.0  cm left axillary node (series 2/ image 18), previously 1.7 cm on 12/28/2016, mildly increased. No pathologically enlarged right axillary nodes. Right paratracheal adenopathy measuring up to 1.9 cm, stable. Enlarged subcarinal 2.6 cm node (series 2/ image 56), stable  using similar measurement technique. Enlarged 1.1 cm coarsely calcified AP window node, stable. Stable coarsely calcified mildly enlarged bilateral hilar nodes.  Lungs/Pleura: No pneumothorax. No pleural effusion. No acute consolidative airspace disease. There is moderate cylindrical, varicoid and cystic bronchiectasis scattered in both lungs with associated parenchymal distortion, most prominent in the upper lobes with bullous changes in the upper lobes, not appreciably changed. There is a stable 2.5 cm mycetoma in the dominant apical left upper lobe bulla (series 3/image 8). There is moderate honeycombing throughout both lung bases, not appreciably changed. There is increased patchy nodular thickening of the peripheral peribronchovascular interstitium in the right middle lobe (series 3/ images 43-46).  Upper abdomen: Simple 1.9 cm upper right renal cyst.  Musculoskeletal: No aggressive appearing focal osseous lesions. Mild thoracic spondylosis.  IMPRESSION: 1. Spectrum of findings compatible with fibrotic interstitial lung disease due to pulmonary sarcoidosis, including basilar predominant honeycombing and apical predominant bullous change. Findings are largely stable since 12/28/2016, with mildly increased nodular thickening of the peripheral peribronchovascular interstitium in the right middle lobe. Stable apical left upper lobe mycetoma. 2. Stable left axillary, mediastinal and bilateral hilar adenopathy compatible with sarcoidosis. 3. One vessel coronary atherosclerosis.  Aortic Atherosclerosis (ICD10-I70.0).   COUGH Not entirely clear to me that this is related to her sarcoid but certainly it could be.  Her CT scan of the chest does show some possible increased infiltrate or groundglass superimposed on known honeycomb change.  She has been treated for rhinitis, treated for GERD.  I believe at this point we will treat her for sarcoid as detailed below.  She also needs  bronchoscopy for airway inspection, BAL for cytology and also cell count, eosinophil count.  We will work on scheduling a bronchoscopy in the near future.  Refill her Hycodan  SARCOIDOSIS Unclear to me based on subtle CT scan changes that this actually reflects a flare of her sarcoidosis, but clearly her symptoms are profound.  Coughing is refractory to treatment for, and exacerbators.  I believe at this point we need to treat her for active sarcoidosis, prednisone 30 mg daily for at least 1 month and then decrease.  Must consider the potential risk of activation of her mycetoma, weigh this versus the benefits of prednisone for her sarcoid.   Mycetoma Stable in size on her recent CT scan of the chest  Baltazar Apo, MD, PhD 07/22/2017, 5:20 PM [facility] [phone] or if no answer [phone]

## 2017-07-22 NOTE — Assessment & Plan Note (Signed)
Stable in size on her recent CT scan of the chest

## 2017-07-27 ENCOUNTER — Encounter (HOSPITAL_COMMUNITY): Payer: 59

## 2017-07-27 ENCOUNTER — Telehealth: Payer: Self-pay | Admitting: Emergency Medicine

## 2017-07-27 MED ORDER — LEVOFLOXACIN 500 MG PO TABS: 500.0000 mg | ORAL_TABLET | Freq: Every day | ORAL | 0 refills | Status: DC

## 2017-07-27 NOTE — Telephone Encounter (Signed)
These ask her to start Levaquin 500 mg once a day for the next 5 days.  I would like to leave the bronchoscopy scheduled for now and try to get it done as long as she starts to improve on the medication.

## 2017-07-27 NOTE — Telephone Encounter (Signed)
Pt is calling back to check on Rx that she has called about earlier. Requests a call back from nurse when that is possible.

## 2017-07-27 NOTE — Telephone Encounter (Signed)
Called and spoke with pt and she stated that last week she started with a dry cough and today she is now coughing up green sputum and having chills but no fever.  She feels that she needs an abx to take to clear this up.  She is also scheduled for broch on Thursday and wanted to know if she needed to reschedule this?  RB please advise. Thanks   No Known Allergies

## 2017-07-27 NOTE — Telephone Encounter (Signed)
Called pt and advised message from the provider. Pt understood and verbalized understanding. Nothing further is needed.    Rx sent in. 

## 2017-07-29 ENCOUNTER — Encounter (HOSPITAL_COMMUNITY)
Admission: RE | Disposition: A | Discharge status: Home / Self Care | Payer: Self-pay | Source: Ambulatory Visit | Attending: Emergency Medicine

## 2017-07-29 ENCOUNTER — Ambulatory Visit (HOSPITAL_COMMUNITY)
Admission: RE | Admit: 2017-07-29 | Discharge: 2017-07-29 | Disposition: A | Discharge status: Home / Self Care | Payer: 59 | Source: Ambulatory Visit | Attending: Emergency Medicine | Admitting: Emergency Medicine

## 2017-07-29 ENCOUNTER — Encounter (HOSPITAL_COMMUNITY): Payer: Self-pay | Admitting: Respiratory Therapy

## 2017-07-29 DIAGNOSIS — J849 Interstitial pulmonary disease, unspecified: Secondary | ICD-10-CM | POA: Diagnosis not present

## 2017-07-29 DIAGNOSIS — I2510 Atherosclerotic heart disease of native coronary artery without angina pectoris: Secondary | ICD-10-CM | POA: Diagnosis not present

## 2017-07-29 DIAGNOSIS — R059 Cough, unspecified: Secondary | ICD-10-CM

## 2017-07-29 DIAGNOSIS — D860 Sarcoidosis of lung: Secondary | ICD-10-CM | POA: Insufficient documentation

## 2017-07-29 DIAGNOSIS — K219 Gastro-esophageal reflux disease without esophagitis: Secondary | ICD-10-CM | POA: Insufficient documentation

## 2017-07-29 DIAGNOSIS — J309 Allergic rhinitis, unspecified: Secondary | ICD-10-CM | POA: Diagnosis not present

## 2017-07-29 DIAGNOSIS — J9809 Other diseases of bronchus, not elsewhere classified: Secondary | ICD-10-CM | POA: Diagnosis not present

## 2017-07-29 DIAGNOSIS — D8689 Sarcoidosis of other sites: Secondary | ICD-10-CM | POA: Diagnosis not present

## 2017-07-29 DIAGNOSIS — Q6101 Congenital single renal cyst: Secondary | ICD-10-CM | POA: Insufficient documentation

## 2017-07-29 DIAGNOSIS — M47894 Other spondylosis, thoracic region: Secondary | ICD-10-CM | POA: Diagnosis not present

## 2017-07-29 DIAGNOSIS — R05 Cough: Secondary | ICD-10-CM | POA: Diagnosis not present

## 2017-07-29 DIAGNOSIS — B479 Mycetoma, unspecified: Secondary | ICD-10-CM | POA: Diagnosis not present

## 2017-07-29 DIAGNOSIS — I700 Atherosclerosis of aorta: Secondary | ICD-10-CM | POA: Diagnosis not present

## 2017-07-29 DIAGNOSIS — Z8614 Personal history of Methicillin resistant Staphylococcus aureus infection: Secondary | ICD-10-CM | POA: Insufficient documentation

## 2017-07-29 DIAGNOSIS — D869 Sarcoidosis, unspecified: Secondary | ICD-10-CM | POA: Diagnosis present

## 2017-07-29 DIAGNOSIS — R053 Chronic cough: Secondary | ICD-10-CM | POA: Diagnosis present

## 2017-07-29 HISTORY — PX: VIDEO BRONCHOSCOPY: SHX5072

## 2017-07-29 LAB — BODY FLUID CELL COUNT WITH DIFFERENTIAL
Eos, Fluid: 3 %
LYMPHS FL: 1 %
MONOCYTE-MACROPHAGE-SEROUS FLUID: 6 % — AB (ref 50–90)
Neutrophil Count, Fluid: 90 % — ABNORMAL HIGH (ref 0–25)
Total Nucleated Cell Count, Fluid: 4300 cu mm — ABNORMAL HIGH (ref 0–1000)

## 2017-07-29 SURGERY — BRONCHOSCOPY, WITH FLUOROSCOPY
Anesthesia: Moderate Sedation | Laterality: Bilateral

## 2017-07-29 MED ORDER — MIDAZOLAM HCL 5 MG/ML IJ SOLN
INTRAMUSCULAR | Status: AC
  Filled 2017-07-29: qty 2

## 2017-07-29 MED ORDER — LIDOCAINE HCL 2 % EX GEL
CUTANEOUS | Status: DC | PRN
  Administered 2017-07-29: 1

## 2017-07-29 MED ORDER — PHENYLEPHRINE HCL 0.25 % NA SOLN
NASAL | Status: DC | PRN
  Administered 2017-07-29: 2 via NASAL

## 2017-07-29 MED ORDER — LIDOCAINE HCL 1 % IJ SOLN
INTRAMUSCULAR | Status: DC | PRN
  Administered 2017-07-29: 6 mL via RESPIRATORY_TRACT

## 2017-07-29 MED ORDER — SODIUM CHLORIDE 0.9 % IV SOLN
INTRAVENOUS | Status: DC
  Administered 2017-07-29: 07:00:00 via INTRAVENOUS

## 2017-07-29 MED ORDER — FENTANYL CITRATE (PF) 100 MCG/2ML IJ SOLN
INTRAMUSCULAR | Status: AC
  Filled 2017-07-29: qty 4

## 2017-07-29 MED ORDER — FENTANYL CITRATE (PF) 100 MCG/2ML IJ SOLN
INTRAMUSCULAR | Status: DC | PRN
  Administered 2017-07-29: 25 ug via INTRAVENOUS
  Administered 2017-07-29: 50 ug via INTRAVENOUS
  Administered 2017-07-29 (×2): 25 ug via INTRAVENOUS

## 2017-07-29 MED ORDER — MIDAZOLAM HCL 10 MG/2ML IJ SOLN
INTRAMUSCULAR | Status: DC | PRN
  Administered 2017-07-29 (×2): 1 mg via INTRAVENOUS
  Administered 2017-07-29: 3 mg via INTRAVENOUS
  Administered 2017-07-29: 1 mg via INTRAVENOUS

## 2017-07-29 NOTE — Discharge Instructions (Signed)
Flexible Bronchoscopy, Care After These instructions give you information on caring for yourself after your procedure. Your doctor may also give you more specific instructions. Call your doctor if you have any problems or questions after your procedure. Follow these instructions at home:  Do not eat or drink anything for 2 hours after your procedure. If you try to eat or drink before the medicine wears off, food or drink could go into your lungs. You could also burn yourself.  After 2 hours have passed and when you can cough and gag normally, you may eat soft food and drink liquids slowly.  The day after the test, you may eat your normal diet.  You may do your normal activities.  Keep all doctor visits. Get help right away if:  You get more and more short of breath.  You get light-headed.  You feel like you are going to pass out (faint).  You have chest pain.  You have new problems that worry you.  You cough up more than a little blood.  You cough up more blood than before. This information is not intended to replace advice given to you by your health care provider. Make sure you discuss any questions you have with your health care provider. Document Released: 06/07/2009 Document Revised: 01/16/2016 Document Reviewed: 04/14/2013 Elsevier Interactive Patient Education  2017 [place].  Nothing to eat or drink until  10:00   am  today  07/29/2017  Please call our office for any questions or concerns. [phone].

## 2017-07-29 NOTE — Op Note (Signed)
[facility] Patient Name: [name] Procedure Date: 07/29/2017 MRN: [id number] Attending MD: Collene Gobble , MD Date of Birth: 1955/12/18 CSN: [id number] Age: 61 Admit Type: Outpatient Ethnicity: Not Hispanic or Latino Procedure:            Bronchoscopy Indications:          Chronic cough with abnormal CT, Sarcoidosis with lung                        involvement Providers:            Collene Gobble, MD, Andre Lefort RRT,RCP, Ashley Mariner RRT,RCP Referring MD:          Medicines:            Midazolam 6 mg IV, Fentanyl 125 mcg IV, Lidocaine                        applied to nares and subglottic space, Lidocaine 1%                        applied to cords 8 mL, Lidocaine 1% applied to the                        tracheobronchial tree 16 mL Complications:        No immediate complications Estimated Blood Loss: Estimated blood loss: none. Procedure:      Pre-Anesthesia Assessment:      - A History and Physical has been performed. Patient meds and allergies       have been reviewed. The risks and benefits of the procedure and the       sedation options and risks were discussed with the patient. All       questions were answered and informed consent was obtained. Patient       identification and proposed procedure were verified prior to the       procedure by the physician in the procedure room. Mental Status       Examination: alert and oriented. Airway Examination: normal       oropharyngeal airway. Respiratory Examination: bibasilar crackles. CV       Examination: normal. ASA Grade Assessment: II - A patient with mild       systemic disease. After reviewing the risks and benefits, the patient       was deemed in satisfactory condition to undergo the procedure. The       anesthesia plan was to use moderate sedation / analgesia (conscious       sedation). Immediately prior to administration of medications, the        patient was re-assessed for adequacy to receive sedatives. The heart       rate, respiratory rate, oxygen saturations, blood pressure, adequacy of       pulmonary ventilation, and response to care were monitored throughout       the procedure. The physical status of the patient was re-assessed after       the procedure.      After obtaining informed consent, the bronchoscope was passed under       direct vision. Throughout the procedure, the patient's blood pressure,       pulse, and oxygen saturations were  monitored continuously. the [id number]       ([id number]) scope was introduced through the right nostril and advanced to       the tracheobronchial tree. The procedure was extremely difficult due to       the patient's coughing. The patient tolerated the procedure. The total       duration of the procedure was 13 minutes. Findings:      Larynx: Hyperplastic changes were found diffusely, throughout the       larynx. The changes are not obstructing the airway. Erythema was found       diffusely, throughout the larynx. Expiratory glottic narrowing.      Trachea/Carina Abnormalities: Erythema was found throughout the       tracheobronchial tree. Edema was found throughout the tracheobronchial       tree. An area of inflamed mucosa was found throughout the       tracheobronchial tree. Non-obstructing bronchomalacia was found       throughout the tracheobronchial tree, particularly with cough. No       endobronchial lesions seen. BAL was performed in the RUL apical segment       (B1) of the lung and sent for cell count, bacterial culture, viral       smears & culture, and fungal & AFB analysis and cytology. 60 mL of fluid       were instilled. 22 mL were returned. The return was cloudy. There were       no mucoid plugs in the return fluid. Impression:      - Chronic cough with abnormal CT      - Sarcoidosis with lung involvement      - Hyperplastic changes were seen diffusely, throughout the  larynx.      - Erythema was visualized diffusely, throughout the larynx.      - Expiratory glottic narrowing was found.      - Erythema was present throughout the tracheobronchial tree.      - Edema was present throughout the tracheobronchial tree.      - Mucosal inflammation was visualized throughout the tracheobronchial       tree.      - Bronchomalacia was visualized throughout the tracheobronchial tree.      - Bronchoalveolar lavage was performed. Moderate Sedation:      Moderate (conscious) sedation was personally administered by the       endoscopist. The following parameters were monitored: oxygen saturation,       heart rate, blood pressure, respiratory rate, EKG, adequacy of pulmonary       ventilation, and response to care. Total physician intraservice time was       24 minutes. Recommendation:      - Await BAL, culture and cytology results. Procedure Code(s):      --- Professional ---      [phone], Bronchoscopy, rigid or flexible, including fluoroscopic guidance,       when performed; with bronchial alveolar lavage      99152, Moderate sedation services provided by the same physician or       other qualified health care professional performing the diagnostic or       therapeutic service that the sedation supports, requiring the presence       of an independent trained observer to assist in the monitoring of the       patient's level of consciousness and physiological status; initial 15       minutes of intraservice time, patient  age 74 years or older      [phone], Moderate sedation services; each additional 15 minutes       intraservice time Diagnosis Code(s):      --- Professional ---      R05, Cough      D86.0, Sarcoidosis of lung CPT copyright 2016 American Medical Association. All rights reserved. The codes documented in this report are preliminary and upon coder review may  be revised to meet current compliance requirements. Collene Gobble, MD Collene Gobble,  MD 07/29/2017 8:25:04 AM Number of Addenda: 0 Scope In: 7:20:94 AM Scope Out: 7:09:62 AM

## 2017-07-29 NOTE — Interval H&P Note (Signed)
History and Physical Interval Note:  07/29/2017 7:34 AM  [name]  has presented today for surgery, with the diagnosis of Cough  The various methods of treatment have been discussed with the patient and family. After consideration of risks, benefits and other options for treatment, the patient has consented to  Procedure(s): VIDEO BRONCHOSCOPY WITH FLUORO (Bilateral) as a surgical intervention .  The patient's history has been reviewed, patient examined, no change in status, stable for surgery.  I have reviewed the patient's chart and labs.  Questions were answered to the patient's satisfaction.     Collene Gobble

## 2017-07-29 NOTE — Progress Notes (Signed)
Video Bronchoscopy done Intervention Bronchial washing done Procedure tolerated well 

## 2017-07-30 LAB — ACID FAST SMEAR (AFB)

## 2017-07-30 LAB — ACID FAST SMEAR (AFB, MYCOBACTERIA): Acid Fast Smear: NEGATIVE

## 2017-07-31 LAB — CULTURE, BAL-QUANTITATIVE: CULTURE: NO GROWTH

## 2017-07-31 LAB — CULTURE, BAL-QUANTITATIVE W GRAM STAIN

## 2017-08-01 LAB — ASPERGILLUS ANTIGEN, BAL/SERUM: Aspergillus Ag, BAL/Serum: 0.63 Index — ABNORMAL HIGH (ref 0.00–0.49)

## 2017-08-17 ENCOUNTER — Other Ambulatory Visit: Payer: Self-pay | Admitting: Emergency Medicine

## 2017-08-26 ENCOUNTER — Other Ambulatory Visit: Payer: Self-pay | Admitting: Emergency Medicine

## 2017-08-31 ENCOUNTER — Ambulatory Visit: Payer: 59 | Admitting: Emergency Medicine

## 2017-09-07 LAB — FUNGUS CULTURE WITH STAIN

## 2017-09-07 LAB — FUNGAL ORGANISM REFLEX

## 2017-09-07 LAB — FUNGUS CULTURE RESULT

## 2017-09-11 LAB — ACID FAST CULTURE WITH REFLEXED SENSITIVITIES: ACID FAST CULTURE - AFSCU3: NEGATIVE

## 2017-09-11 LAB — ACID FAST CULTURE WITH REFLEXED SENSITIVITIES (MYCOBACTERIA)

## 2017-09-21 ENCOUNTER — Encounter: Payer: Self-pay | Admitting: Emergency Medicine

## 2017-09-21 ENCOUNTER — Ambulatory Visit (INDEPENDENT_AMBULATORY_CARE_PROVIDER_SITE_OTHER): Payer: 59 | Admitting: Emergency Medicine

## 2017-09-21 VITALS — BP 130/82 | HR 71 | Ht 65.0 in | Wt 170.0 lb

## 2017-09-21 DIAGNOSIS — R059 Cough, unspecified: Secondary | ICD-10-CM

## 2017-09-21 DIAGNOSIS — J320 Chronic maxillary sinusitis: Secondary | ICD-10-CM | POA: Diagnosis not present

## 2017-09-21 DIAGNOSIS — R05 Cough: Secondary | ICD-10-CM

## 2017-09-21 DIAGNOSIS — B479 Mycetoma, unspecified: Secondary | ICD-10-CM | POA: Diagnosis not present

## 2017-09-21 DIAGNOSIS — D869 Sarcoidosis, unspecified: Secondary | ICD-10-CM | POA: Diagnosis not present

## 2017-09-21 MED ORDER — CLINDAMYCIN HCL 300 MG PO CAPS: 300.0000 mg | ORAL_CAPSULE | Freq: Three times a day (TID) | ORAL | 0 refills | Status: DC

## 2017-09-21 NOTE — Assessment & Plan Note (Signed)
She has been on prednisone 30 mg daily for 2 months.  I will wean her prednisone to off over the next 10 days.  Follow her symptoms, imaging

## 2017-09-21 NOTE — Progress Notes (Signed)
HPI:   [name] 07/22/17 --this is a follow-up visit for 62 year old woman with a history of sarcoidosis, associated significant interstitial lung disease.  Her ILD is complicated by presumed left upper lobe mycetoma.  Also with sinus disease status post debridement by Dr. [name] with ENT.  She has chronic cough and allergic rhinitis.  At her last visit she continued to have dyspnea, cough.  I stopped her Symbicort, continued prednisone, loratadine, Protonix.  We repeated her CT scan on 06/18/17 that I have reviewed.  This shows diffuse upper lobe bullous change and moderate honeycombing that is stable there is some increased patchy nodular thickening in the peripheral peribronchovascular interstitium in the right middle lobe.  The mycetoma appears to be stable.  She has non-stop cough, feels exhausted, has hoarse voice, head congestion.   [name] 09/21/17 --[name] has a complicated history as above.  She is 62 with a history of sarcoidosis with stage III-IV pulmonary involvement, sinus involvement (history of debridement by Dr. [name] in the past).  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  She has chronic times intractable cough.  She underwent bronchoscopy on 07/29/17 that showed diffuse erythema and edema with bronchomalacia.  AFB was negative.  BAL Aspergillus antigen was 0.63 (elevated), fungal culture showed Aspergillus fumigatus.  I have been treating her for possible sarcoid flare w pred 60m daily x 2 months. I treated her with levaquin for sinus dz evolving into cough and chest sx. She had some clinda so started it 4 days ago, has benefited some already.                                                                EXAM :  Vitals:   09/21/17 1632  BP: 130/82  Pulse: 71  SpO2: 100%  Weight: 170 lb (77.1 kg)  Height: _0  (1.651 m)   Gen: Pleasant, well-nourished, in no distress,  normal affect  ENT: No lesions,  mouth clear,  oropharynx clear, no postnasal drip,   Neck: No  JVD, no stridor  Lungs: No use of accessory muscles, right sided inspiratory squeaks, no crackles, no wheezing  Cardiovascular: RRR, heart sounds normal, no murmur or gallops, no peripheral edema  Musculoskeletal: No deformities, no cyanosis or clubbing  Neuro: alert, non focal  Skin: Warm, no lesions or rashes    CT  chest 06/18/17 --  COMPARISON:  12/28/2016 high-resolution chest CT.  FINDINGS: Cardiovascular: Top-normal heart size. No significant pericardial fluid/thickening. Left anterior descending coronary atherosclerosis. Mildly atherosclerotic nonaneurysmal thoracic aorta. Normal caliber pulmonary arteries. No central pulmonary emboli.  Mediastinum/Nodes: No discrete thyroid nodules. Unremarkable esophagus. Enlarged 2.0 cm left axillary node (series 2/ image 18), previously 1.7 cm on 12/28/2016, mildly increased. No pathologically enlarged right axillary nodes. Right paratracheal adenopathy measuring up to 1.9 cm, stable. Enlarged subcarinal 2.6 cm node (series 2/ image 56), stable using similar measurement technique. Enlarged 1.1 cm coarsely calcified AP window node, stable. Stable coarsely calcified mildly enlarged bilateral hilar nodes.  Lungs/Pleura: No pneumothorax. No pleural effusion. No acute consolidative airspace disease. There is moderate cylindrical, varicoid and cystic bronchiectasis scattered in both lungs with associated parenchymal distortion, most prominent in the upper lobes with bullous changes in the upper lobes, not appreciably changed. There is  a stable 2.5 cm mycetoma in the dominant apical left upper lobe bulla (series 3/image 8). There is moderate honeycombing throughout both lung bases, not appreciably changed. There is increased patchy nodular thickening of the peripheral peribronchovascular interstitium in the right middle lobe (series 3/ images 43-46).  Upper abdomen: Simple 1.9 cm upper right renal cyst.  Musculoskeletal: No  aggressive appearing focal osseous lesions. Mild thoracic spondylosis.  IMPRESSION: 1. Spectrum of findings compatible with fibrotic interstitial lung disease due to pulmonary sarcoidosis, including basilar predominant honeycombing and apical predominant bullous change. Findings are largely stable since 12/28/2016, with mildly increased nodular thickening of the peripheral peribronchovascular interstitium in the right middle lobe. Stable apical left upper lobe mycetoma. 2. Stable left axillary, mediastinal and bilateral hilar adenopathy compatible with sarcoidosis. 3. One vessel coronary atherosclerosis.  Aortic Atherosclerosis (ICD10-I70.0).   Cough Very complicated case.  Multiple potential contributors to cough, dyspnea, wheezing.  She has identified sinus disease that appears to be contributing to cough, chest mucus.  Treated her with levofloxacin and she got temporary relief but then symptoms recurred.  She then started clindamycin and is already starting to get better.  Certainly she may have chronic sinusitis.  Note that she has been treated surgically for sarcoid involvement in her sinuses.  Question whether this may be necessary again.  I will treat her with 3 weeks of clindamycin and see if her symptoms resolve.  I have treated her for 2 months with prednisone 30 mg daily for possible sarcoidosis.  This even though her CT scan has been for the most part stable.  Also note that her BAL grew out Aspergillus and she does have a left upper lobe mycetoma.  Question whether part of this could be ABPA or Aspergillus sensitivity.  Best way to establish would be with eosinophil count, IgE but this would best be done when she is off prednisone.  I am planning to taper her prednisone to 0 before next visit.  We will check her IgE and eosinophil count at that time.  She may merit treatment of her Aspergillus with either voriconazole or itraconazole.  We will also determine at that time when she  needs repeat chest imaging.   Sarcoidosis She has been on prednisone 30 mg daily for 2 months.  I will wean her prednisone to off over the next 10 days.  Follow her symptoms, imaging  Mycetoma This was stable in size and appearance on her most recent CT scan from October.  She may need repeat imaging.  Note that her BAL did show Aspergillus and her Aspergillus antigen was elevated.  Chronic sinusitis Treating with clindamycin.  She may need referral back to Dr. [name].  Suspect she will need sinus imaging prior to that referral.  Dana Apo, MD, PhD 09/21/2017, 5:09 PM [facility] [phone] or if no answer [phone]

## 2017-09-21 NOTE — Assessment & Plan Note (Signed)
Treating with clindamycin.  She may need referral back to Dr. [name].  Suspect she will need sinus imaging prior to that referral.

## 2017-09-21 NOTE — Assessment & Plan Note (Signed)
This was stable in size and appearance on her most recent CT scan from October.  She may need repeat imaging.  Note that her BAL did show Aspergillus and her Aspergillus antigen was elevated.

## 2017-09-21 NOTE — Assessment & Plan Note (Signed)
Very complicated case.  Multiple potential contributors to cough, dyspnea, wheezing.  She has identified sinus disease that appears to be contributing to cough, chest mucus.  Treated her with levofloxacin and she got temporary relief but then symptoms recurred.  She then started clindamycin and is already starting to get better.  Certainly she may have chronic sinusitis.  Note that she has been treated surgically for sarcoid involvement in her sinuses.  Question whether this may be necessary again.  I will treat her with 3 weeks of clindamycin and see if her symptoms resolve.  I have treated her for 2 months with prednisone 30 mg daily for possible sarcoidosis.  This even though her CT scan has been for the most part stable.  Also note that her BAL grew out Aspergillus and she does have a left upper lobe mycetoma.  Question whether part of this could be ABPA or Aspergillus sensitivity.  Best way to establish would be with eosinophil count, IgE but this would best be done when she is off prednisone.  I am planning to taper her prednisone to 0 before next visit.  We will check her IgE and eosinophil count at that time.  She may merit treatment of her Aspergillus with either voriconazole or itraconazole.  We will also determine at that time when she needs repeat chest imaging.

## 2017-09-21 NOTE — Patient Instructions (Addendum)
Take clindamycin 600 mg every 8 hours for the next 3 weeks. We will decrease your prednisone as follows: Take 20 mg daily for 5 days, then take 10 mg daily for 5 days, then stop altogether Continue your Symbicort as you have been taking it Keep albuterol available to use if needed for shortness of breath, chest tightness, wheezing At your next visit we will perform lab work to evaluate for any evidence of sensitivity to Aspergillus fumigatus, a fungus that can sometimes infect the lung or cause a hypersensitivity reaction in the airways Follow with Dr Lamonte Sakai in 1 month

## 2017-10-21 ENCOUNTER — Ambulatory Visit: Payer: 59 | Admitting: Emergency Medicine

## 2017-10-27 ENCOUNTER — Other Ambulatory Visit (INDEPENDENT_AMBULATORY_CARE_PROVIDER_SITE_OTHER): Payer: 59

## 2017-10-27 ENCOUNTER — Encounter: Payer: Self-pay | Admitting: Adult Health

## 2017-10-27 ENCOUNTER — Ambulatory Visit (INDEPENDENT_AMBULATORY_CARE_PROVIDER_SITE_OTHER): Payer: 59 | Admitting: Adult Health

## 2017-10-27 VITALS — BP 128/80 | HR 66 | Ht 65.0 in | Wt 168.8 lb

## 2017-10-27 DIAGNOSIS — R059 Cough, unspecified: Secondary | ICD-10-CM

## 2017-10-27 DIAGNOSIS — D869 Sarcoidosis, unspecified: Secondary | ICD-10-CM | POA: Diagnosis not present

## 2017-10-27 DIAGNOSIS — B479 Mycetoma, unspecified: Secondary | ICD-10-CM

## 2017-10-27 DIAGNOSIS — R05 Cough: Secondary | ICD-10-CM | POA: Diagnosis not present

## 2017-10-27 DIAGNOSIS — J320 Chronic maxillary sinusitis: Secondary | ICD-10-CM

## 2017-10-27 LAB — CBC WITH DIFFERENTIAL/PLATELET
BASOS PCT: 0.8 % (ref 0.0–3.0)
Basophils Absolute: 0.1 10*3/uL (ref 0.0–0.1)
EOS ABS: 0.4 10*3/uL (ref 0.0–0.7)
Eosinophils Relative: 5.3 % — ABNORMAL HIGH (ref 0.0–5.0)
HEMATOCRIT: 38.9 % (ref 36.0–46.0)
HEMOGLOBIN: 12.9 g/dL (ref 12.0–15.0)
LYMPHS PCT: 28 % (ref 12.0–46.0)
Lymphs Abs: 2.2 10*3/uL (ref 0.7–4.0)
MCHC: 33 g/dL (ref 30.0–36.0)
MCV: 85.7 fl (ref 78.0–100.0)
MONOS PCT: 13.5 % — AB (ref 3.0–12.0)
Monocytes Absolute: 1 10*3/uL (ref 0.1–1.0)
Neutro Abs: 4 10*3/uL (ref 1.4–7.7)
Neutrophils Relative %: 52.4 % (ref 43.0–77.0)
Platelets: 222 10*3/uL (ref 150.0–400.0)
RBC: 4.54 Mil/uL (ref 3.87–5.11)
RDW: 15.2 % (ref 11.5–15.5)
WBC: 7.7 10*3/uL (ref 4.0–10.5)

## 2017-10-27 MED ORDER — FLUTTER DEVI: 0 refills | Status: DC

## 2017-10-27 NOTE — Progress Notes (Signed)
_0  ID: [name], female    DOB: [birth year], 62 y.o.   MRN: [id number]  Chief Complaint  Patient presents with  . Follow-up    Saroid     Referring provider: No ref. provider found  HPI: 62 year old female never smoker followed for sarcoidosis-pulmonary and sinus involvement, associated interstitial lung disease, left upper lobe mycetoma, chronic sinus disease  TEST   bronchoscopy on 07/29/17 that showed diffuse erythema and edema with bronchomalacia.  AFB was negative.  BAL Aspergillus antigen was 0.63 (elevated), fungal culture showed Aspergillus fumigatus  10/27/2017 Follow up : Sarcoid , Aspergillus, Chronic sinus dz  Pt returns for 6 week follow up , last visit with ongoing cough and sinus congestion.  She was treated with prednisone burst , Levaquin and clindamycin.  She says she is feeling better.  Her sinus congestion has improved greatly.  And her cough is decreased.  She has tapered off of prednisone. 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  Previous bronchoscopy in December 2000 818 showed diffuse erythema with edema and bronchial [place].  BAL Aspergillus Ag was elevated and fungal culture showed Aspergillus.  She denies any fever hemoptysis orthopnea or edema No Known Allergies  Immunization History  Administered Date(s) Administered  . Influenza Split 06/21/2011, 05/23/2012, 05/24/2013  . Influenza Whole 05/24/2010  . Influenza,inj,Quad PF,6+ Mos 06/07/2015, 06/05/2016, 05/17/2017  . Pneumococcal Conjugate-13 06/07/2015    Past Medical History:  Diagnosis Date  . Chronic sinusitis   . Cough   . Diabetes mellitus without complication ([place])   . Hypertension   . Sarcoidosis     Tobacco History: Social History   Tobacco Use  Smoking Status Never Smoker  Smokeless Tobacco Never Used   Counseling given: Not Answered   Outpatient Encounter Medications as of 10/27/2017  Medication Sig  . albuterol (PROAIR HFA) 108 (90 Base) MCG/ACT  inhaler Inhale 1 puff into the lungs every 4 (four) hours as needed for wheezing or shortness of breath.  Marland Kitchen aspirin 81 MG tablet Take 81 mg by mouth daily.  Marland Kitchen aspirin-acetaminophen-caffeine (EXCEDRIN MIGRAINE) 250-250-65 MG per tablet Take 1 tablet by mouth every 6 (six) hours as needed for pain.  Marland Kitchen atorvastatin (LIPITOR) 20 MG tablet Take 20 mg by mouth daily.  . budesonide-formoterol (SYMBICORT) 160-4.5 MCG/ACT inhaler Inhale 2 puffs into the lungs 2 (two) times daily.  . Cholecalciferol (VITAMIN D-3) 5000 UNITS TABS Take 5,000 Units by mouth daily.  . fenofibrate 160 MG tablet Take 160 mg by mouth daily.  . fish oil-omega-3 fatty acids 1000 MG capsule Take 2 g by mouth daily.  . fluticasone (FLONASE) 50 MCG/ACT nasal spray USE 1 SPRAY INTO BOTH  NOSTRILS DAILY AS NEEDED  . HYDROcodone-homatropine (HYCODAN) 5-1.5 MG/5ML syrup Take 5 mLs by mouth every 6 (six) hours as needed for cough.  Marland Kitchen ibuprofen (ADVIL,MOTRIN) 200 MG tablet Take 400 mg by mouth every 6 (six) hours as needed.  Marland Kitchen levofloxacin (LEVAQUIN) 500 MG tablet Take 1 tablet (500 mg total) by mouth daily.  Marland Kitchen loratadine (CLARITIN) 10 MG tablet Take 1 tablet (10 mg total) by mouth daily.  Marland Kitchen losartan-hydrochlorothiazide (HYZAAR) 50-12.5 MG per tablet Take 1 tablet by mouth daily.  . Multiple Vitamin (MULTIVITAMIN) capsule Take 1 capsule by mouth daily.    . Naproxen Sod-Diphenhydramine (ALEVE PM PO) Take 1 tablet by mouth at bedtime as needed and may repeat dose one time if needed.  . niacin (NIASPAN) 1000 MG CR tablet Take 1,000 mg  by mouth at bedtime.  . pantoprazole (PROTONIX) 40 MG tablet TAKE 1 TABLET BY MOUTH  DAILY  . [DISCONTINUED] clindamycin (CLEOCIN) 300 MG capsule Take 1 capsule (300 mg total) by mouth 3 (three) times daily.  Marland Kitchen Respiratory Therapy Supplies (FLUTTER) DEVI Use as directed.  . [DISCONTINUED] methotrexate (RHEUMATREX) 2.5 MG tablet 5 tablets every 7 days  . [DISCONTINUED] predniSONE (DELTASONE) 10 MG tablet Take 3  tablets (30 mg total) by mouth daily with breakfast.   No facility-administered encounter medications on file as of 10/27/2017.      Review of Systems  Constitutional:   No  weight loss, night sweats,  Fevers, chills,  +fatigue, or  lassitude.  HEENT:   No headaches,  Difficulty swallowing,  Tooth/dental problems, or  Sore throat,                No sneezing, itching, ear ache,+ nasal congestion, post nasal drip,   CV:  No chest pain,  Orthopnea, PND, swelling in lower extremities, anasarca, dizziness, palpitations, syncope.   GI  No heartburn, indigestion, abdominal pain, nausea, vomiting, diarrhea, change in bowel habits, loss of appetite, bloody stools.   Resp:    No chest wall deformity  Skin: no rash or lesions.  GU: no dysuria, change in color of urine, no urgency or frequency.  No flank pain, no hematuria   MS:  No joint pain or swelling.  No decreased range of motion.  No back pain.    Physical Exam  BP 128/80 (BP Location: Left Arm, Cuff Size: Normal)   Pulse 66   Ht _0  (1.651 m)   Wt 168 lb 12.8 oz (76.6 kg)   SpO2 99%   BMI 28.09 kg/m   GEN: A/Ox3; pleasant , NAD    HEENT:  [place]/AT,  EACs-clear, TMs-wnl, NOSE-crusting left nare , THROAT-clear, no lesions, no postnasal drip or exudate noted.   NECK:  Supple w/ fair ROM; no JVD; normal carotid impulses w/o bruits; no thyromegaly or nodules palpated; no lymphadenopathy.    RESP  Few trace rhonchi , . no accessory muscle use, no dullness to percussion  CARD:  RRR, no m/r/g, no peripheral edema, pulses intact, no cyanosis or clubbing.  GI:   Soft & nt; nml bowel sounds; no organomegaly or masses detected.   Musco: Warm bil, no deformities or joint swelling noted.   Neuro: alert, no focal deficits noted.    Skin: Warm, no lesions or rashes    Lab Results:  CBC  No results found for: BNP  ProBNP No results found for: PROBNP  Imaging: No results found.   Assessment & Plan:   Sarcoidosis No  apparent flare off of prednisone  Plan  . Patient Instructions  Continue on current regimen .  Begin flutter valve Three times a day  .  Labs today  Follow up with Dr. Lamonte Sakai  In 6 weeks and .As needed   Please contact office for sooner follow up if symptoms do not improve or worsen or seek emergency care       Mycetoma Check Ige and CBC w/ diff - r/o ABPA  Consider rpeat CT on return   Chronic sinusitis Recent flare now resolving  Cont on sinus regimen .      Rexene Edison, NP 10/27/2017

## 2017-10-27 NOTE — Patient Instructions (Signed)
Continue on current regimen .  Begin flutter valve Three times a day  .  Labs today  Follow up with Dr. Lamonte Sakai  In 6 weeks and .As needed   Please contact office for sooner follow up if symptoms do not improve or worsen or seek emergency care

## 2017-10-27 NOTE — Assessment & Plan Note (Signed)
Check Ige and CBC w/ diff - r/o ABPA  Consider rpeat CT on return

## 2017-10-27 NOTE — Progress Notes (Signed)
Patient seen in the office today and instructed on use of Flutter Valve.  Patient expressed understanding and demonstrated technique. Parke Poisson, CMA 10/27/17

## 2017-10-27 NOTE — Assessment & Plan Note (Signed)
No apparent flare off of prednisone  Plan  . Patient Instructions  Continue on current regimen .  Begin flutter valve Three times a day  .  Labs today  Follow up with Dr. Lamonte Sakai  In 6 weeks and .As needed   Please contact office for sooner follow up if symptoms do not improve or worsen or seek emergency care

## 2017-10-27 NOTE — Assessment & Plan Note (Signed)
Recent flare now resolving  Cont on sinus regimen .

## 2017-10-28 LAB — IGE: IgE (Immunoglobulin E), Serum: 30 kU/L (ref <=114)

## 2017-11-01 NOTE — Progress Notes (Signed)
Was able to talk to the patient regarding their results.  They verbalized an understanding of what was discussed. No further questions at this time. 

## 2017-11-12 ENCOUNTER — Other Ambulatory Visit: Payer: Self-pay | Admitting: Emergency Medicine

## 2017-12-01 ENCOUNTER — Encounter: Payer: Self-pay | Admitting: Emergency Medicine

## 2017-12-01 ENCOUNTER — Other Ambulatory Visit (INDEPENDENT_AMBULATORY_CARE_PROVIDER_SITE_OTHER): Payer: 59

## 2017-12-01 ENCOUNTER — Ambulatory Visit (INDEPENDENT_AMBULATORY_CARE_PROVIDER_SITE_OTHER): Payer: 59 | Admitting: Emergency Medicine

## 2017-12-01 VITALS — BP 130/84 | HR 65 | Ht 65.0 in | Wt 163.0 lb

## 2017-12-01 DIAGNOSIS — J320 Chronic maxillary sinusitis: Secondary | ICD-10-CM

## 2017-12-01 DIAGNOSIS — D8686 Sarcoid arthropathy: Secondary | ICD-10-CM | POA: Diagnosis not present

## 2017-12-01 DIAGNOSIS — B479 Mycetoma, unspecified: Secondary | ICD-10-CM

## 2017-12-01 DIAGNOSIS — M2550 Pain in unspecified joint: Secondary | ICD-10-CM | POA: Diagnosis not present

## 2017-12-01 DIAGNOSIS — B4481 Allergic bronchopulmonary aspergillosis: Secondary | ICD-10-CM

## 2017-12-01 DIAGNOSIS — D869 Sarcoidosis, unspecified: Secondary | ICD-10-CM

## 2017-12-01 LAB — BASIC METABOLIC PANEL
BUN: 15 mg/dL (ref 6–23)
CO2: 31 mEq/L (ref 19–32)
CREATININE: 0.88 mg/dL (ref 0.40–1.20)
Calcium: 9.8 mg/dL (ref 8.4–10.5)
Chloride: 104 mEq/L (ref 96–112)
GFR: 84.35 mL/min (ref >60.00)
GLUCOSE: 115 mg/dL — AB (ref 70–99)
POTASSIUM: 3.7 meq/L (ref 3.5–5.1)
Sodium: 139 mEq/L (ref 135–145)

## 2017-12-01 NOTE — Assessment & Plan Note (Signed)
Having more joint pain since coming off prednisone. May need to restart some form anti-inflammatory regimen.

## 2017-12-01 NOTE — Assessment & Plan Note (Signed)
Her IgE remained low even off prednisone.  Argues against ABPA.  Continue surveillance by CT scan of her mycetoma and continue to be open to the idea that she is at risk for ABPA, allergy mediated asthma.

## 2017-12-01 NOTE — Assessment & Plan Note (Signed)
She clinically improved after 3 weeks of clindamycin, but continues to have yellow sinus drainage.  This in addition to her clear allergic rhinitis.  She is on a good allergy regimen.  If the symptoms continue then I may need to refer her back to see Dr. [name], consider reimaging her sinuses.

## 2017-12-01 NOTE — Assessment & Plan Note (Signed)
Now off prednisone. Hopefully we will be able to achieve some stability, otherwise will need to revisit steroid sparing agents. Next imaging planned for 05/2018. Will perform sooner if she changes clinically.

## 2017-12-01 NOTE — Progress Notes (Signed)
HPI:   [name] 07/22/17 --this is a follow-up visit for 62 year old woman with a history of sarcoidosis, associated significant interstitial lung disease.  Her ILD is complicated by presumed left upper lobe mycetoma.  Also with sinus disease status post debridement by Dr. [name] with ENT.  She has chronic cough and allergic rhinitis.  At her last visit she continued to have dyspnea, cough.  I stopped her Symbicort, continued prednisone, loratadine, Protonix.  We repeated her CT scan on 06/18/17 that I have reviewed.  This shows diffuse upper lobe bullous change and moderate honeycombing that is stable there is some increased patchy nodular thickening in the peripheral peribronchovascular interstitium in the right middle lobe.  The mycetoma appears to be stable.  She has non-stop cough, feels exhausted, has hoarse voice, head congestion.   [name] 09/21/17 --Ms. Mcnicholas has a complicated history as above.  She is 60 with a history of sarcoidosis with stage III-IV pulmonary involvement, sinus involvement (history of debridement by Dr. [name] in the past).  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  She has chronic times intractable cough.  She underwent bronchoscopy on 07/29/17 that showed diffuse erythema and edema with bronchomalacia.  AFB was negative.  BAL Aspergillus antigen was 0.63 (elevated), fungal culture showed Aspergillus fumigatus.  I have been treating her for possible sarcoid flare w pred 61m daily x 2 months. I treated her with levaquin for sinus dz evolving into cough and chest sx. She had some clinda so started it 4 days ago, has benefited some already.    [name] 12/01/17 --follow-up visit for patient with a history of significant parenchymal lung disease from sarcoidosis with a left upper lobe cavity and presumed mycetoma.  She is also had sinus involvement of her sarcoidosis with a history of debridement in the past.  She has chronic cough and allergic rhinitis.  The last time I saw her  she was dealing with progressive cough even after being treated with prednisone.  She had started clindamycin and was benefiting.      We weaned her prednisone to off,  but continued a course of clindamycin for 3 weeks. We performed an IgE off of prednisone on 10/27/17 that was 30.  She is doing nasal washes, loratadine, nasal steroid. She continues to have some cough and tightness. She is off symbicort. She has B knee, ankle, elbow pain. Has been   EXAM :  Vitals:   12/01/17 1640  BP: 130/84  Pulse: 65  SpO2: 100%  Weight: 163 lb (73.9 kg)  Height: _0  (1.651 m)   Gen: Pleasant, well-nourished, in no distress,  normal affect  ENT: No lesions,  mouth clear,  oropharynx clear, no postnasal drip,   Neck: No JVD, no stridor  Lungs: No use of accessory muscles, right sided inspiratory squeaks, no crackles, no wheezing  Cardiovascular: RRR, heart sounds normal, no murmur or gallops, no peripheral edema  Musculoskeletal: No deformities, no cyanosis or clubbing  Neuro: alert, non focal  Skin: Warm, no lesions or rashes    CT  chest 06/18/17 --  COMPARISON:  12/28/2016 high-resolution chest CT.  FINDINGS: Cardiovascular: Top-normal heart size. No significant pericardial fluid/thickening. Left anterior descending coronary atherosclerosis. Mildly atherosclerotic nonaneurysmal thoracic aorta. Normal caliber pulmonary arteries. No central pulmonary emboli.  Mediastinum/Nodes: No discrete thyroid nodules. Unremarkable esophagus. Enlarged 2.0 cm left axillary node (series 2/ image 18), previously 1.7 cm on 12/28/2016, mildly increased. No pathologically enlarged right axillary nodes. Right paratracheal adenopathy  measuring up to 1.9 cm, stable. Enlarged subcarinal 2.6 cm node (series 2/ image 56), stable using similar measurement technique. Enlarged 1.1 cm coarsely calcified AP window node, stable. Stable coarsely calcified mildly enlarged bilateral hilar nodes.  Lungs/Pleura: No  pneumothorax. No pleural effusion. No acute consolidative airspace disease. There is moderate cylindrical, varicoid and cystic bronchiectasis scattered in both lungs with associated parenchymal distortion, most prominent in the upper lobes with bullous changes in the upper lobes, not appreciably changed. There is a stable 2.5 cm mycetoma in the dominant apical left upper lobe bulla (series 3/image 8). There is moderate honeycombing throughout both lung bases, not appreciably changed. There is increased patchy nodular thickening of the peripheral peribronchovascular interstitium in the right middle lobe (series 3/ images 43-46).  Upper abdomen: Simple 1.9 cm upper right renal cyst.  Musculoskeletal: No aggressive appearing focal osseous lesions. Mild thoracic spondylosis.  IMPRESSION: 1. Spectrum of findings compatible with fibrotic interstitial lung disease due to pulmonary sarcoidosis, including basilar predominant honeycombing and apical predominant bullous change. Findings are largely stable since 12/28/2016, with mildly increased nodular thickening of the peripheral peribronchovascular interstitium in the right middle lobe. Stable apical left upper lobe mycetoma. 2. Stable left axillary, mediastinal and bilateral hilar adenopathy compatible with sarcoidosis. 3. One vessel coronary atherosclerosis.  Aortic Atherosclerosis (ICD10-I70.0).   Sarcoid arthropathy ([place]) Having more joint pain since coming off prednisone. May need to restart some form anti-inflammatory regimen.   Chronic sinusitis She clinically improved after 3 weeks of clindamycin, but continues to have yellow sinus drainage.  This in addition to her clear allergic rhinitis.  She is on a good allergy regimen.  If the symptoms continue then I may need to refer her back to see Dr. [name], consider reimaging her sinuses.  Mycetoma Her IgE remained low even off prednisone.  Argues against ABPA.  Continue  surveillance by CT scan of her mycetoma and continue to be open to the idea that she is at risk for ABPA, allergy mediated asthma.  Sarcoidosis Now off prednisone. Hopefully we will be able to achieve some stability, otherwise will need to revisit steroid sparing agents. Next imaging planned for 05/2018. Will perform sooner if she changes clinically.   Baltazar Apo, MD, PhD 12/01/2017, 5:18 PM [facility] [phone] or if no answer [phone]

## 2017-12-01 NOTE — Patient Instructions (Addendum)
We will perform blood work today Please continue to use your loratadine, your nasal steroid spray, ear nasal saline washes as you have been doing them.  If you continue to have yellow sinus mucus then we may need to consider referring you back to Dr. [name] to be reevaluated. Please use your albuterol 2 puffs if needed for shortness of breath, wheezing, chest tightness.  Keep track of how often you use this and whether it helps your symptoms.  This will help [place] determine whether to restart an every day inhaled medication in the future. We will plan to repeat your CT scan of the chest without contrast in October 2019 to follow your sarcoidosis.  If you develop new symptoms in the interim we may decide to do your scan earlier. Follow with Dr Lamonte Sakai next available to review your labs, albuterol use and to decide whether to refer you to see Dr [name].

## 2017-12-02 LAB — IGE: IgE (Immunoglobulin E), Serum: 40 kU/L (ref <=114)

## 2017-12-02 LAB — RHEUMATOID FACTOR: Rhuematoid fact SerPl-aCnc: <14 IU/mL (ref <14)

## 2018-06-29 ENCOUNTER — Other Ambulatory Visit: Payer: Self-pay | Admitting: Emergency Medicine

## 2018-07-17 NOTE — Progress Notes (Signed)
_0  ID: [name], female    DOB: 20-Sep-1955, 62 y.o.   MRN: [id number]  Chief Complaint  Patient presents with  . Acute Visit    Short of breath    Referring provider: No ref. provider found  HPI:  62 year old female never smoker followed in our office for ABPA, sarcoidosis, cough  PMH: Mycetoma, cough, sarcoid arthropathy Smoker/ Smoking History: Never smoker Maintenance: Symbicort 160 Pt of: Dr. Lamonte Sakai   07/18/2018  - Visit   62 year old female patient presenting today for acute visit.  Patient reports she was seen last week by primary care and was restarted on Symbicort 160 as well as Dexilant 60 mg for acid reflux.  Patient has started back on 30 mg of prednisone daily she had leftover tablets from her previous prescription.  Patient reporting that since starting prednisone and Symbicort 160 shortness of breath, cough, fatigue, chest tightness have all improved.  Patient reports she is still having a baseline cough as well as fatigue..  Patient reports that she has not had any nausea, vomiting, diarrhea.  She previously was coughing so bad that she was throwing up.  This is why they switched her PPI to Dexilant.  Patient has not had the symptoms since her recent medication changes.  Patient did not complete October/2019 CT imaging follow-up.  Patient reports that her insurance coverage is exceptionally poor.  She would like to wait on additional scans until January/2020 if indicated. In 2020 patient's insurance coverage will improve to where her scans will be as expensive for her.      Tests:   bronchoscopy on 07/29/17 that showed diffuse erythema and edema with bronchomalacia. AFB was negative. BAL Aspergillus antigen was 0.63 (elevated),fungal culture showed Aspergillus fumigatus  10/27/2017-IgE-30 12/01/17-IgE-40 12/01/17-rheumatoid factor-less than 14  06/18/17-CT chest with contrast- spectrum of findings compatible with fibrotic interstitial lung disease due  to pulmonary sarcoidosis, stable apical left upper lobe mycetoma  12/28/2016-CT chest high-res-mediastinal hilar and upper mid lung zone predominant changes of sarcoid, as before with stable mycetoma and apical left upper lobe, basilar pulmonary parenchymal changes of UIP  FENO:  No results found for: NITRICOXIDE  PFT: PFT Results Latest Ref Rng & Units 01/13/2017  FVC-Pre L 2.20  FVC-Predicted Pre % 76  FVC-Post L 2.16  FVC-Predicted Post % 75  Pre FEV1/FVC % % 83  Post FEV1/FCV % % 89  FEV1-Pre L 1.83  FEV1-Predicted Pre % 81  FEV1-Post L 1.91  DLCO UNC% % 57  DLCO COR %Predicted % 84  TLC L 3.70  TLC % Predicted % 70  RV % Predicted % 66    Imaging: No results found.  Chart Review:    Specialty Problems      Pulmonary Problems   Cough    Qualifier: Diagnosis of  By: Lamonte Sakai MD, Rose Fillers       Chronic sinusitis      No Known Allergies  Immunization History  Administered Date(s) Administered  . Influenza Split 06/21/2011, 05/23/2012, 05/24/2013  . Influenza Whole 05/24/2010  . Influenza,inj,Quad PF,6+ Mos 06/07/2015, 06/05/2016, 05/17/2017  . Pneumococcal Conjugate-13 06/07/2015    Past Medical History:  Diagnosis Date  . Chronic sinusitis   . Cough   . Diabetes mellitus without complication ([place] )   . Hypertension   . Sarcoidosis     Tobacco History: Social History   Tobacco Use  Smoking Status Never Smoker  Smokeless Tobacco Never Used   Counseling given: Yes  Continue to not smoke  Outpatient Encounter Medications as of 07/18/2018  Medication Sig  . albuterol (PROAIR HFA) 108 (90 Base) MCG/ACT inhaler Inhale 1 puff into the lungs every 4 (four) hours as needed for wheezing or shortness of breath.  Marland Kitchen aspirin 81 MG tablet Take 81 mg by mouth daily.  Marland Kitchen aspirin-acetaminophen-caffeine (EXCEDRIN MIGRAINE) 250-250-65 MG per tablet Take 1 tablet by mouth every 6 (six) hours as needed for pain.  Marland Kitchen atorvastatin (LIPITOR) 20 MG tablet Take 20 mg by  mouth daily.  . Cholecalciferol (VITAMIN D-3) 5000 UNITS TABS Take 5,000 Units by mouth daily.  Marland Kitchen Dexlansoprazole (DEXILANT) 30 MG capsule Take 30 mg by mouth daily.  . fenofibrate 160 MG tablet Take 160 mg by mouth daily.  . fish oil-omega-3 fatty acids 1000 MG capsule Take 2 g by mouth daily.  . fluticasone (FLONASE) 50 MCG/ACT nasal spray USE 1 SPRAY INTO BOTH  NOSTRILS DAILY AS NEEDED  . ibuprofen (ADVIL,MOTRIN) 200 MG tablet Take 400 mg by mouth every 6 (six) hours as needed.  . loratadine (CLARITIN) 10 MG tablet Take 1 tablet (10 mg total) by mouth daily.  Marland Kitchen losartan-hydrochlorothiazide (HYZAAR) 50-12.5 MG per tablet Take 1 tablet by mouth daily.  . Multiple Vitamin (MULTIVITAMIN) capsule Take 1 capsule by mouth daily.    . Naproxen Sod-Diphenhydramine (ALEVE PM PO) Take 1 tablet by mouth at bedtime as needed and may repeat dose one time if needed.  . niacin (NIASPAN) 1000 MG CR tablet Take 1,000 mg by mouth at bedtime.  . predniSONE (DELTASONE) 10 MG tablet Take 30 mg by mouth daily with breakfast.  . Respiratory Therapy Supplies (FLUTTER) DEVI Use as directed.  . SYMBICORT 160-4.5 MCG/ACT inhaler USE 2 PUFFS TWO TIMES DAILY  . [DISCONTINUED] HYDROcodone-homatropine (HYCODAN) 5-1.5 MG/5ML syrup Take 5 mLs by mouth every 6 (six) hours as needed for cough.  . [DISCONTINUED] levofloxacin (LEVAQUIN) 500 MG tablet Take 1 tablet (500 mg total) by mouth daily.  . [DISCONTINUED] methotrexate (RHEUMATREX) 2.5 MG tablet 5 tablets every 7 days  . [DISCONTINUED] pantoprazole (PROTONIX) 40 MG tablet TAKE 1 TABLET BY MOUTH  DAILY   No facility-administered encounter medications on file as of 07/18/2018.     Review of Systems  Review of Systems  Constitutional: Positive for fatigue. Negative for chills, fever and unexpected weight change.  HENT: Positive for postnasal drip. Negative for congestion, ear pain, sinus pressure and sinus pain.   Respiratory: Positive for cough (drying out ), chest  tightness, shortness of breath and wheezing (worse when laying flat).   Cardiovascular: Negative for chest pain and palpitations.  Gastrointestinal: Negative for blood in stool, diarrhea, nausea and vomiting.  Genitourinary: Negative for dysuria, frequency and urgency.  Musculoskeletal: Negative for arthralgias.  Skin: Negative for color change.  Allergic/Immunologic: Positive for environmental allergies. Negative for food allergies.  Neurological: Negative for dizziness, light-headedness and headaches.  Psychiatric/Behavioral: Negative for dysphoric mood. The patient is not nervous/anxious.   All other systems reviewed and are negative.    Physical Exam  BP 132/88 (BP Location: Left Arm, Cuff Size: Normal)   Pulse 67   Ht _0  (1.651 m)   Wt 166 lb (75.3 kg)   SpO2 97%   BMI 27.62 kg/m   Wt Readings from Last 5 Encounters:  07/18/18 166 lb (75.3 kg)  12/01/17 163 lb (73.9 kg)  10/27/17 168 lb 12.8 oz (76.6 kg)  09/21/17 170 lb (77.1 kg)  07/22/17 170 lb (77.1 kg)    Physical Exam  Constitutional: She  is oriented to person, place, and time and well-developed, well-nourished, and in no distress. No distress.  HENT:  Head: Normocephalic and atraumatic.  Right Ear: Hearing, tympanic membrane, external ear and ear canal normal.  Left Ear: Hearing, tympanic membrane, external ear and ear canal normal.  Nose: Mucosal edema present. Right sinus exhibits maxillary sinus tenderness. Right sinus exhibits no frontal sinus tenderness. Left sinus exhibits maxillary sinus tenderness. Left sinus exhibits no frontal sinus tenderness.  Mouth/Throat: Uvula is midline and oropharynx is clear and moist. No oropharyngeal exudate.  Eyes: Pupils are equal, round, and reactive to light.  Neck: Normal range of motion. Neck supple. No JVD present.  Cardiovascular: Normal rate, regular rhythm and normal heart sounds.  Pulmonary/Chest: Effort normal and breath sounds normal. No accessory muscle usage.  No respiratory distress. She has no decreased breath sounds. She has no wheezes. She has no rhonchi.  Abdominal: Soft. Bowel sounds are normal. There is no tenderness.  Musculoskeletal: Normal range of motion. She exhibits no edema.  Lymphadenopathy:    She has no cervical adenopathy.  Neurological: She is alert and oriented to person, place, and time. Gait normal.  Skin: Skin is warm and dry. She is not diaphoretic. No erythema.  Psychiatric: Mood, memory, affect and judgment normal.  Nursing note and vitals reviewed.     Lab Results:  CBC    Component Value Date/Time   WBC 7.7 10/27/2017 1059   RBC 4.54 10/27/2017 1059   HGB 12.9 10/27/2017 1059   HCT 38.9 10/27/2017 1059   PLT 222.0 10/27/2017 1059   MCV 85.7 10/27/2017 1059   MCH 27.7 08/07/2016 1021   MCHC 33.0 10/27/2017 1059   RDW 15.2 10/27/2017 1059   LYMPHSABS 2.2 10/27/2017 1059   MONOABS 1.0 10/27/2017 1059   EOSABS 0.4 10/27/2017 1059   BASOSABS 0.1 10/27/2017 1059    BMET    Component Value Date/Time   NA 139 12/01/2017 1720   K 3.7 12/01/2017 1720   CL 104 12/01/2017 1720   CO2 31 12/01/2017 1720   GLUCOSE 115 (H) 12/01/2017 1720   BUN 15 12/01/2017 1720   CREATININE 0.88 12/01/2017 1720   CALCIUM 9.8 12/01/2017 1720   GFRNONAA >60 08/07/2016 1021   GFRAA >60 08/07/2016 1021    BNP No results found for: BNP  ProBNP No results found for: PROBNP    Assessment & Plan:   Pleasant 62 year old female patient seen for office visit today.  Patient symptoms are improving.  We will have patient continue on Symbicort 160.  Sample provided in office today.  While patient started to transition down from 30 mg of prednisone to 20 mg for 1 week, then 10 mg for 1 week then stop.  Patient has not had any fevers.  Patient is having clear nasal drainage.  I believe we can continue to monitor her symptoms.  If patient starts to have discolored nasal drainage, fevers, or increased sinus facial pain then we could  consider antibiotic coverage at that time.  Patient knows to contact our office if the symptoms occur.  Patient to continue [place].  Patient to follow-up with our office in January/2020.  We can also consider repeating CT imaging at that time.  Discussed was the initial plan of care.  At that time patient will have different insurance coverage.  Chronic sinusitis  Keep monitor of symptoms if nasal drainage color changes, fever start, or symptoms are persisting greater than 10 days please contact our office so were able  to start on antibiotic  Follow-up with our office in January 2020  Sarcoidosis Continue Symbicort 160 >>> 2 puffs in the morning right when you wake up, rinse out your mouth after use, 12 hours later 2 puffs, rinse after use >>> Take this daily, no matter what >>> This is not a rescue inhaler   Prednisone 10 mg tablet >>>Please start 20 mg of prednisone daily on 07/19/2018 >>>On 07/26/2018 you will start prednisone 10 mg daily >>>On 08/02/2018 we will stop prednisone daily  Please take prednisone with food in the morning  We can hold off on imaging at this time until January/2020 based off of your request, if symptoms worsen we may need to restart imaging sooner  Keep monitor of symptoms if nasal drainage color changes, fever start, or symptoms are persisting greater than 10 days please contact our office so were able to start on antibiotic  Follow-up with our office in January 2020  Mycetoma We can hold off on imaging at this time until January/2020 based off of your request, if symptoms worsen we may need to restart imaging sooner  Keep monitor of symptoms if nasal drainage color changes, fever start, or symptoms are persisting greater than 10 days please contact our office so were able to start on antibiotic  Follow-up with our office in January 2020   This appointment was 28 minutes along with over 50% that time direct face-to-face patient care, assessment,  plan of care follow-up.  Lauraine Rinne, NP 07/18/2018

## 2018-07-18 ENCOUNTER — Ambulatory Visit (INDEPENDENT_AMBULATORY_CARE_PROVIDER_SITE_OTHER): Payer: 59 | Admitting: Pulmonary Disease

## 2018-07-18 ENCOUNTER — Encounter: Payer: Self-pay | Admitting: Pulmonary Disease

## 2018-07-18 VITALS — BP 132/88 | HR 67 | Ht 65.0 in | Wt 166.0 lb

## 2018-07-18 DIAGNOSIS — D869 Sarcoidosis, unspecified: Secondary | ICD-10-CM | POA: Diagnosis not present

## 2018-07-18 DIAGNOSIS — B479 Mycetoma, unspecified: Secondary | ICD-10-CM

## 2018-07-18 DIAGNOSIS — J320 Chronic maxillary sinusitis: Secondary | ICD-10-CM | POA: Diagnosis not present

## 2018-07-18 NOTE — Assessment & Plan Note (Signed)
  Keep monitor of symptoms if nasal drainage color changes, fever start, or symptoms are persisting greater than 10 days please contact our office so were able to start on antibiotic  Follow-up with our office in January 2020

## 2018-07-18 NOTE — Patient Instructions (Signed)
Continue Symbicort 160 >>> 2 puffs in the morning right when you wake up, rinse out your mouth after use, 12 hours later 2 puffs, rinse after use >>> Take this daily, no matter what >>> This is not a rescue inhaler   Prednisone 10 mg tablet >>>Please start 20 mg of prednisone daily on 07/19/2018 >>>On 07/26/2018 you will start prednisone 10 mg daily >>>On 08/02/2018 we will stop prednisone daily  Please take prednisone with food in the morning  We can hold off on imaging at this time until January/2020 based off of your request, if symptoms worsen we may need to restart imaging sooner  Keep monitor of symptoms if nasal drainage color changes, fever start, or symptoms are persisting greater than 10 days please contact our office so were able to start on antibiotic  Follow-up with our office in January 2020  It is flu season:   >>>Remember to be washing your hands regularly, using hand sanitizer, be careful to use around herself with has contact with people who are sick will increase her chances of getting sick yourself. >>> Best ways to protect herself from the flu: Receive the yearly flu vaccine, practice good hand hygiene washing with soap and also using hand sanitizer when available, eat a nutritious meals, get adequate rest, hydrate appropriately   Please contact the office if your symptoms worsen or you have concerns that you are not improving.   Thank you for choosing [facility] for your healthcare, and for allowing [place] to partner with you on your healthcare journey. I am thankful to be able to provide care to you today.   Wyn Quaker FNP-C

## 2018-07-18 NOTE — Assessment & Plan Note (Signed)
We can hold off on imaging at this time until January/2020 based off of your request, if symptoms worsen we may need to restart imaging sooner  Keep monitor of symptoms if nasal drainage color changes, fever start, or symptoms are persisting greater than 10 days please contact our office so were able to start on antibiotic  Follow-up with our office in January 2020

## 2018-07-18 NOTE — Assessment & Plan Note (Signed)
Continue Symbicort 160 >>> 2 puffs in the morning right when you wake up, rinse out your mouth after use, 12 hours later 2 puffs, rinse after use >>> Take this daily, no matter what >>> This is not a rescue inhaler   Prednisone 10 mg tablet >>>Please start 20 mg of prednisone daily on 07/19/2018 >>>On 07/26/2018 you will start prednisone 10 mg daily >>>On 08/02/2018 we will stop prednisone daily  Please take prednisone with food in the morning  We can hold off on imaging at this time until January/2020 based off of your request, if symptoms worsen we may need to restart imaging sooner  Keep monitor of symptoms if nasal drainage color changes, fever start, or symptoms are persisting greater than 10 days please contact our office so were able to start on antibiotic  Follow-up with our office in January 2020

## 2018-07-26 ENCOUNTER — Telehealth: Payer: Self-pay | Admitting: Pulmonary Disease

## 2018-07-26 MED ORDER — AMOXICILLIN-POT CLAVULANATE 875-125 MG PO TABS: 1.0000 | ORAL_TABLET | Freq: Two times a day (BID) | ORAL | 0 refills | Status: DC

## 2018-07-26 NOTE — Telephone Encounter (Signed)
Sorry the patient is not feeling well.  Can offer patient:  Augmentin >>> Take 1 875-125 mg tablet every 12 hours for the next 7 days >>> Take with food  Please place the order.  Continue with other plan of care as prescribed.  If symptoms persist in spite of antibiotic treatment will need office visit and probably will need imaging.  Wyn Quaker FNP

## 2018-07-26 NOTE — Telephone Encounter (Signed)
Continue Symbicort 160 >>> 2 puffs in the morning right when you wake up, rinse out your mouth after use, 12 hours later 2 puffs, rinse after use >>> Take this daily, no matter what >>> This is not a rescue inhaler   Prednisone 10 mg tablet >>>Please start 20 mg of prednisone daily on 07/19/2018 >>>On 07/26/2018 you will start prednisone 10 mg daily >>>On 08/02/2018 we will stop prednisone daily  Please take prednisone with food in the morning  We can hold off on imaging at this time until January/2020 based off of your request, if symptoms worsen we may need to restart imaging sooner  Keep monitor of symptoms if nasal drainage color changes, fever start, or symptoms are persisting greater than 10 days please contact our office so were able to start on antibiotic  Follow-up with our office in January 2020   Mack, Please advise on what to give patient as she is not feeling any better. Thanks.

## 2018-07-26 NOTE — Telephone Encounter (Signed)
Called both numbers and left VM to return call.  Rx sent to preferred pharmacy, will need to call pt back on 07/27/2018.

## 2018-07-27 NOTE — Telephone Encounter (Signed)
Spoke with the pt and notified that the rx was sent  She verbalized understanding and nothing further needed

## 2018-07-27 NOTE — Telephone Encounter (Signed)
Message recd from answering service that patient returned call on 12/03 at 5:23 pm, CB is [phone].

## 2018-07-28 ENCOUNTER — Encounter: Payer: Self-pay | Admitting: Internal Medicine

## 2018-07-30 ENCOUNTER — Other Ambulatory Visit: Payer: Self-pay | Admitting: Emergency Medicine

## 2018-08-04 ENCOUNTER — Other Ambulatory Visit: Payer: Self-pay | Admitting: Emergency Medicine

## 2018-08-29 ENCOUNTER — Ambulatory Visit (INDEPENDENT_AMBULATORY_CARE_PROVIDER_SITE_OTHER): Payer: 59 | Admitting: Emergency Medicine

## 2018-08-29 ENCOUNTER — Encounter: Payer: Self-pay | Admitting: Emergency Medicine

## 2018-08-29 DIAGNOSIS — R05 Cough: Secondary | ICD-10-CM

## 2018-08-29 DIAGNOSIS — D869 Sarcoidosis, unspecified: Secondary | ICD-10-CM | POA: Diagnosis not present

## 2018-08-29 DIAGNOSIS — R059 Cough, unspecified: Secondary | ICD-10-CM

## 2018-08-29 DIAGNOSIS — J320 Chronic maxillary sinusitis: Secondary | ICD-10-CM | POA: Diagnosis not present

## 2018-08-29 LAB — CBC WITH DIFFERENTIAL/PLATELET
BASOS PCT: 1.2 % (ref 0.0–3.0)
Basophils Absolute: 0.1 10*3/uL (ref 0.0–0.1)
EOS PCT: 7.6 % — AB (ref 0.0–5.0)
Eosinophils Absolute: 0.5 10*3/uL (ref 0.0–0.7)
HCT: 38.5 % (ref 36.0–46.0)
Hemoglobin: 12.7 g/dL (ref 12.0–15.0)
Lymphocytes Relative: 22.2 % (ref 12.0–46.0)
Lymphs Abs: 1.3 10*3/uL (ref 0.7–4.0)
MCHC: 33.1 g/dL (ref 30.0–36.0)
MCV: 85.8 fl (ref 78.0–100.0)
Monocytes Absolute: 0.7 10*3/uL (ref 0.1–1.0)
Monocytes Relative: 11.3 % (ref 3.0–12.0)
Neutro Abs: 3.5 10*3/uL (ref 1.4–7.7)
Neutrophils Relative %: 57.7 % (ref 43.0–77.0)
Platelets: 228 10*3/uL (ref 150.0–400.0)
RBC: 4.48 Mil/uL (ref 3.87–5.11)
RDW: 13.7 % (ref 11.5–15.5)
WBC: 6 10*3/uL (ref 4.0–10.5)

## 2018-08-29 MED ORDER — CLINDAMYCIN HCL 300 MG PO CAPS: 300.0000 mg | ORAL_CAPSULE | Freq: Three times a day (TID) | ORAL | 0 refills | Status: AC

## 2018-08-29 NOTE — Progress Notes (Signed)
HPI:   [name] 09/21/17 --[name] has a complicated history as above.  [name] is 77 with a history of sarcoidosis with stage III-IV pulmonary involvement, sinus involvement (history of debridement by Dr. [name] in the past).  [name] has a presumed left upper lobe mycetoma on CT scan.  [name] has chronic times intractable cough.  [name] underwent bronchoscopy on 07/29/17 that showed diffuse erythema and edema with bronchomalacia.  AFB was negative.  BAL Aspergillus antigen was 0.63 (elevated), fungal culture showed Aspergillus fumigatus.  I have been treating her for possible sarcoid flare w pred 9m daily x 2 months. I treated her with levaquin for sinus dz evolving into cough and chest sx. [name] had some clinda so started it 4 days ago, has benefited some already.    [name] 12/01/17 --follow-up visit for patient with a history of significant parenchymal lung disease from sarcoidosis with a left upper lobe cavity and presumed mycetoma.  [name] is also had sinus involvement of her sarcoidosis with a history of debridement in the past.  [name] has chronic cough and allergic rhinitis.  The last time I saw her [name] was dealing with progressive cough even after being treated with prednisone.  [name] had started clindamycin and was benefiting.      We weaned her prednisone to off,  but continued a course of clindamycin for 3 weeks. We performed an IgE off of prednisone on 10/27/17 that was 30.  [name] is doing nasal washes, loratadine, nasal steroid. [name] continues to have some cough and tightness. [name] is off symbicort. [name] has B knee, ankle, elbow pain. Has been   [name] 08/29/18 --[name] follows up today for pulmonary sarcoidosis with left upper lobe cavitary lesion and presumed mycetoma.  Fungal cultures have shown Aspergillus, IgE reassuring and not suggestive of ABPA.  [name] is been on prednisone in the past, currently off. Was restarted on Symbicort since our last visit.  [name] underwent a taper at the end of November.  We have also treated her for  chronic sinusitis and [name] is been followed by ENT in the past, again in late November.  [name] is having cough, nasal congestion. Having a hoarse voice for last 3-4 weeks, more cough. Remains on pantoprazole, flonase, claritin, nasal rinses.   EXAM :  Vitals:   08/29/18 0912  BP: (!) 144/100  Pulse: 77  SpO2: 98%  Weight: 164 lb (74.4 kg)  Height: _0  (1.651 m)   Gen: Pleasant, well-nourished, in no distress,  normal affect  ENT: No lesions,  mouth clear,  oropharynx clear, no postnasal drip, hoarse voice  Neck: No JVD, no stridor  Lungs: No use of accessory muscles, bilateral inspiratory crackles best heard at the bases  Cardiovascular: RRR, heart sounds normal, no murmur or gallops, no peripheral edema  Musculoskeletal: No deformities, no cyanosis or clubbing  Neuro: alert, non focal  Skin: Warm, no lesions or rashes    CT  chest 06/18/17 --  COMPARISON:  12/28/2016 high-resolution chest CT.  FINDINGS: Cardiovascular: Top-normal heart size. No significant pericardial fluid/thickening. Left anterior descending coronary atherosclerosis. Mildly atherosclerotic nonaneurysmal thoracic aorta. Normal caliber pulmonary arteries. No central pulmonary emboli.  Mediastinum/Nodes: No discrete thyroid nodules. Unremarkable esophagus. Enlarged 2.0 cm left axillary node (series 2/ image 18), previously 1.7 cm on 12/28/2016, mildly increased. No pathologically enlarged right axillary nodes. Right paratracheal adenopathy measuring up to 1.9 cm, stable. Enlarged subcarinal 2.6 cm node (series 2/ image 56), stable using similar measurement technique. Enlarged 1.1 cm coarsely  calcified AP window node, stable. Stable coarsely calcified mildly enlarged bilateral hilar nodes.  Lungs/Pleura: No pneumothorax. No pleural effusion. No acute consolidative airspace disease. There is moderate cylindrical, varicoid and cystic bronchiectasis scattered in both lungs with associated parenchymal  distortion, most prominent in the upper lobes with bullous changes in the upper lobes, not appreciably changed. There is a stable 2.5 cm mycetoma in the dominant apical left upper lobe bulla (series 3/image 8). There is moderate honeycombing throughout both lung bases, not appreciably changed. There is increased patchy nodular thickening of the peripheral peribronchovascular interstitium in the right middle lobe (series 3/ images 43-46).  Upper abdomen: Simple 1.9 cm upper right renal cyst.  Musculoskeletal: No aggressive appearing focal osseous lesions. Mild thoracic spondylosis.  IMPRESSION: 1. Spectrum of findings compatible with fibrotic interstitial lung disease due to pulmonary sarcoidosis, including basilar predominant honeycombing and apical predominant bullous change. Findings are largely stable since 12/28/2016, with mildly increased nodular thickening of the peripheral peribronchovascular interstitium in the right middle lobe. Stable apical left upper lobe mycetoma. 2. Stable left axillary, mediastinal and bilateral hilar adenopathy compatible with sarcoidosis. 3. One vessel coronary atherosclerosis.  Aortic Atherosclerosis (ICD10-I70.0).   Sarcoidosis Complex case.  Not always easy to differentiate her upper airway irritation and cough from her sarcoidosis.  Also note that [name] has a history of recurrent sinusitis and this could be playing a role in her cough as well.  [name] is back on Symbicort and I think that this is appropriate.  I will check a CBC, IgE given her history of a mycetoma and Aspergillus culture positivity.  Check ACE level, CT scan of the chest  Chronic sinusitis Repeat CT scan of the sinuses.  [name] has continued drainage, gets out purulent mucus with her nasal rinses.  I think is reasonable to treat her with clindamycin which has been effective in the past.  [name] will likely need to go back to ENT given the recurrent.  If her CT scan of the sinuses is  reassuring then we may be able to stop the clindamycin early.  Cough As above some components of the sound like upper airway irritation.  The Symbicort may be exacerbating but [name] is benefiting from it in other respects.  Continue her PPI, allergy regimen.  Treat sinusitis as above.  Continue evaluation of her systemic sarcoidosis to assess whether this is a contributor.  Baltazar Apo, MD, PhD 08/29/2018, 9:44 AM [facility] [phone] or if no answer [phone]

## 2018-08-29 NOTE — Assessment & Plan Note (Signed)
As above some components of the sound like upper airway irritation.  The Symbicort may be exacerbating but she is benefiting from it in other respects.  Continue her PPI, allergy regimen.  Treat sinusitis as above.  Continue evaluation of her systemic sarcoidosis to assess whether this is a contributor.

## 2018-08-29 NOTE — Assessment & Plan Note (Signed)
Complex case.  Not always easy to differentiate her upper airway irritation and cough from her sarcoidosis.  Also note that she has a history of recurrent sinusitis and this could be playing a role in her cough as well.  She is back on Symbicort and I think that this is appropriate.  I will check a CBC, IgE given her history of a mycetoma and Aspergillus culture positivity.  Check ACE level, CT scan of the chest

## 2018-08-29 NOTE — Assessment & Plan Note (Signed)
Repeat CT scan of the sinuses.  She has continued drainage, gets out purulent mucus with her nasal rinses.  I think is reasonable to treat her with clindamycin which has been effective in the past.  She will likely need to go back to ENT given the recurrent.  If her CT scan of the sinuses is reassuring then we may be able to stop the clindamycin early.

## 2018-08-29 NOTE — Patient Instructions (Addendum)
Lab work today.  We will arrange for Ct scans of the chest and sinuses  Please continue Symbicort 2 puffs twice a day for now.  Remember to rinse and gargle after using. Keep your albuterol available to use 2 puffs if needed for shortness of breath, chest tightness, wheezing. Continue pantoprazole as you are taking it. Please continue Flonase, Claritin, nasal rinses as you are doing them. Please start clindamycin 600 mg 3 times a day for the next 3 weeks.  Depending on the results of your CT scan of the sinuses we may be able to stop this early Follow with Dr Lamonte Sakai in 3 to 4 weeks to review your studies.

## 2018-08-30 LAB — IGE: IgE (Immunoglobulin E), Serum: 50 kU/L (ref <=114)

## 2018-08-31 LAB — IGE: IgE (Immunoglobulin E), Serum: 49 kU/L (ref <=114)

## 2018-08-31 LAB — ANGIOTENSIN CONVERTING ENZYME: Angiotensin-Converting Enzyme: 52 U/L (ref 9–67)

## 2018-09-13 ENCOUNTER — Ambulatory Visit (INDEPENDENT_AMBULATORY_CARE_PROVIDER_SITE_OTHER)
Admission: RE | Admit: 2018-09-13 | Discharge: 2018-09-13 | Disposition: A | Discharge status: Home / Self Care | Payer: 59 | Source: Ambulatory Visit | Attending: Emergency Medicine | Admitting: Emergency Medicine

## 2018-09-13 DIAGNOSIS — D869 Sarcoidosis, unspecified: Secondary | ICD-10-CM

## 2018-09-29 ENCOUNTER — Ambulatory Visit (INDEPENDENT_AMBULATORY_CARE_PROVIDER_SITE_OTHER): Payer: 59 | Admitting: Emergency Medicine

## 2018-09-29 DIAGNOSIS — R05 Cough: Secondary | ICD-10-CM

## 2018-09-29 DIAGNOSIS — R059 Cough, unspecified: Secondary | ICD-10-CM

## 2018-09-29 DIAGNOSIS — D869 Sarcoidosis, unspecified: Secondary | ICD-10-CM | POA: Diagnosis not present

## 2018-09-29 MED ORDER — AMOXICILLIN-POT CLAVULANATE 875-125 MG PO TABS: 1.0000 | ORAL_TABLET | Freq: Two times a day (BID) | ORAL | 0 refills | Status: DC

## 2018-09-29 NOTE — Patient Instructions (Addendum)
Please take Augmentin 875 mg twice a day for the next 3 weeks. Please continue your nasal saline washes as you have been doing them Please continue Symbicort 2 puffs twice a day.  Remember rinse gargle after using this medication. Depending on how your symptoms evolve with this treatment we may decide to refer you back to see Dr. [name] with ENT. We will not start any daily anti-inflammatory medication for sarcoidosis at this time. Follow with Dr Lamonte Sakai in 1 month or next available

## 2018-09-29 NOTE — Assessment & Plan Note (Signed)
Clearly severe parenchymal disease.  Compared her CT scan with her prior from October 2018.  There is been some minimal progression of interstitial prominence but overall the changes are significant but stable.  I do not see any groundglass and I do not see any clear evidence for active sarcoidosis currently.  We will have to follow closely both clinically and radiographically.  If she shows evidence for active disease and she is going need to be on daily maintenance therapy, probably with a steroid sparing agent.  She has not tolerated either prednisone or methotrexate very well in the past.

## 2018-09-29 NOTE — Progress Notes (Signed)
HPI:   [name] 09/21/17 --[name] has a complicated history as above.  She is 72 with a history of sarcoidosis with stage III-IV pulmonary involvement, sinus involvement (history of debridement by Dr. [name] in the past).  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  She has chronic times intractable cough.  She underwent bronchoscopy on 07/29/17 that showed diffuse erythema and edema with bronchomalacia.  AFB was negative.  BAL Aspergillus antigen was 0.63 (elevated), fungal culture showed Aspergillus fumigatus.  I have been treating her for possible sarcoid flare w pred 62m daily x 2 months. I treated her with levaquin for sinus dz evolving into cough and chest sx. She had some clinda so started it 4 days ago, has benefited some already.    [name] 12/01/17 --follow-up visit for patient with a history of significant parenchymal lung disease from sarcoidosis with a left upper lobe cavity and presumed mycetoma.  She is also had sinus involvement of her sarcoidosis with a history of debridement in the past.  She has chronic cough and allergic rhinitis.  The last time I saw her she was dealing with progressive cough even after being treated with prednisone.  She had started clindamycin and was benefiting.      We weaned her prednisone to off,  but continued a course of clindamycin for 3 weeks. We performed an IgE off of prednisone on 10/27/17 that was 30.  She is doing nasal washes, loratadine, nasal steroid. She continues to have some cough and tightness. She is off symbicort. She has B knee, ankle, elbow pain. Has been   [name] 08/29/18 --Ms. Hamrick follows up today for pulmonary sarcoidosis with left upper lobe cavitary lesion and presumed mycetoma.  Fungal cultures have shown Aspergillus, IgE reassuring and not suggestive of ABPA.  She is been on prednisone in the past, currently off. Was restarted on Symbicort since our last visit.  She underwent a taper at the end of November.  We have also treated her for  chronic sinusitis and she is been followed by ENT in the past, again in late November.  She is having cough, nasal congestion. Having a hoarse voice for last 3-4 weeks, more cough. Remains on pantoprazole, flonase, claritin, nasal rinses.   [name] 09/29/18 --63year old woman with a history of pulmonary sarcoidosis with parenchymal distortion and scarring, left upper lobe cavity with a presumed mycetoma, history of Aspergillus on fungal cultures.  Her IgE last time was 49, eosinophil percentage elevated at 7.6%, absolute 0.5, ACE level 52.  She continued to have cough and we Treated her with clindamycin for possible sinusitis given her history of frequent chronic sinus disease.  We performed a CT chest on 1/21 that I reviewed, showed progressive areas of fibrotic change that are base predominant. CT scan of the sinuses from the same day shows chronic postsurgical changes with new bilateral maxillary sinus fluid consistent with acute sinusitis. She completed clinda and feels a lot better but still has brownish nasal discharge that comes out w NSW's.   EXAM :  Vitals:   09/29/18 1648  BP: 126/66  Pulse: 99  SpO2: 100%  Weight: 160 lb (72.6 kg)  Height: _0  (1.651 m)   Gen: Pleasant, well-nourished, in no distress,  normal affect  ENT: No lesions,  mouth clear,  oropharynx clear, no postnasal drip, hoarse voice  Neck: No JVD, no stridor  Lungs: No use of accessory muscles, bilateral inspiratory crackles best heard at the bases  Cardiovascular: RRR, heart sounds normal, no murmur or gallops, no peripheral edema  Musculoskeletal: No deformities, no cyanosis or clubbing  Neuro: alert, non focal  Skin: Warm, no lesions or rashes    CT  chest 06/18/17 --  COMPARISON:  12/28/2016 high-resolution chest CT.  FINDINGS: Cardiovascular: Top-normal heart size. No significant pericardial fluid/thickening. Left anterior descending coronary atherosclerosis. Mildly atherosclerotic nonaneurysmal  thoracic aorta. Normal caliber pulmonary arteries. No central pulmonary emboli.  Mediastinum/Nodes: No discrete thyroid nodules. Unremarkable esophagus. Enlarged 2.0 cm left axillary node (series 2/ image 18), previously 1.7 cm on 12/28/2016, mildly increased. No pathologically enlarged right axillary nodes. Right paratracheal adenopathy measuring up to 1.9 cm, stable. Enlarged subcarinal 2.6 cm node (series 2/ image 56), stable using similar measurement technique. Enlarged 1.1 cm coarsely calcified AP window node, stable. Stable coarsely calcified mildly enlarged bilateral hilar nodes.  Lungs/Pleura: No pneumothorax. No pleural effusion. No acute consolidative airspace disease. There is moderate cylindrical, varicoid and cystic bronchiectasis scattered in both lungs with associated parenchymal distortion, most prominent in the upper lobes with bullous changes in the upper lobes, not appreciably changed. There is a stable 2.5 cm mycetoma in the dominant apical left upper lobe bulla (series 3/image 8). There is moderate honeycombing throughout both lung bases, not appreciably changed. There is increased patchy nodular thickening of the peripheral peribronchovascular interstitium in the right middle lobe (series 3/ images 43-46).  Upper abdomen: Simple 1.9 cm upper right renal cyst.  Musculoskeletal: No aggressive appearing focal osseous lesions. Mild thoracic spondylosis.  IMPRESSION: 1. Spectrum of findings compatible with fibrotic interstitial lung disease due to pulmonary sarcoidosis, including basilar predominant honeycombing and apical predominant bullous change. Findings are largely stable since 12/28/2016, with mildly increased nodular thickening of the peripheral peribronchovascular interstitium in the right middle lobe. Stable apical left upper lobe mycetoma. 2. Stable left axillary, mediastinal and bilateral hilar adenopathy compatible with sarcoidosis. 3. One  vessel coronary atherosclerosis.  Aortic Atherosclerosis (ICD10-I70.0).   Cough She is improved compared with our last visit after being treated with clindamycin for 3 weeks.  Based on this and the results of her CT sinuses I suspect that it was sinusitis that was driving her cough and her breathing symptoms.  Note that her CT chest is still significantly abnormal but that there has not been much interval change compared with her prior.  This argues against the sarcoid being active.  ACE level, IgE both reassuring.  Her eosinophil count is elevated.  She still has symptoms and I think it reasonable to treat her with a longer course in case this is chronic sinusitis.  She will take Augmentin for 3 weeks.  If the sinus symptoms persist at that point then I think she needs to go back to see Dr. [name].  Sarcoidosis Clearly severe parenchymal disease.  Compared her CT scan with her prior from October 2018.  There is been some minimal progression of interstitial prominence but overall the changes are significant but stable.  I do not see any groundglass and I do not see any clear evidence for active sarcoidosis currently.  We will have to follow closely both clinically and radiographically.  If she shows evidence for active disease and she is going need to be on daily maintenance therapy, probably with a steroid sparing agent.  She has not tolerated either prednisone or methotrexate very well in the past.  Baltazar Apo, MD, PhD 09/29/2018, 5:47 PM [facility] [phone] or if no answer [phone]

## 2018-09-29 NOTE — Assessment & Plan Note (Signed)
She is improved compared with our last visit after being treated with clindamycin for 3 weeks.  Based on this and the results of her CT sinuses I suspect that it was sinusitis that was driving her cough and her breathing symptoms.  Note that her CT chest is still significantly abnormal but that there has not been much interval change compared with her prior.  This argues against the sarcoid being active.  ACE level, IgE both reassuring.  Her eosinophil count is elevated.  She still has symptoms and I think it reasonable to treat her with a longer course in case this is chronic sinusitis.  She will take Augmentin for 3 weeks.  If the sinus symptoms persist at that point then I think she needs to go back to see Dr. [name].

## 2018-10-07 ENCOUNTER — Other Ambulatory Visit: Payer: Self-pay | Admitting: Emergency Medicine

## 2018-10-27 ENCOUNTER — Encounter: Payer: Self-pay | Admitting: Pulmonary Disease

## 2018-10-27 ENCOUNTER — Ambulatory Visit (INDEPENDENT_AMBULATORY_CARE_PROVIDER_SITE_OTHER): Payer: 59 | Admitting: Pulmonary Disease

## 2018-10-27 VITALS — BP 106/66 | HR 71 | Temp 97.8°F | Ht 65.0 in | Wt 164.2 lb

## 2018-10-27 DIAGNOSIS — R059 Cough, unspecified: Secondary | ICD-10-CM

## 2018-10-27 DIAGNOSIS — D869 Sarcoidosis, unspecified: Secondary | ICD-10-CM

## 2018-10-27 DIAGNOSIS — K219 Gastro-esophageal reflux disease without esophagitis: Secondary | ICD-10-CM | POA: Diagnosis not present

## 2018-10-27 DIAGNOSIS — B479 Mycetoma, unspecified: Secondary | ICD-10-CM

## 2018-10-27 DIAGNOSIS — R918 Other nonspecific abnormal finding of lung field: Secondary | ICD-10-CM

## 2018-10-27 DIAGNOSIS — J320 Chronic maxillary sinusitis: Secondary | ICD-10-CM

## 2018-10-27 DIAGNOSIS — R05 Cough: Secondary | ICD-10-CM

## 2018-10-27 MED ORDER — BUDESONIDE-FORMOTEROL FUMARATE 160-4.5 MCG/ACT IN AERO: 2.0000 | INHALATION_SPRAY | Freq: Two times a day (BID) | RESPIRATORY_TRACT | 0 refills | Status: DC

## 2018-10-27 MED ORDER — DEXLANSOPRAZOLE 60 MG PO CPDR: 60.0000 mg | DELAYED_RELEASE_CAPSULE | Freq: Every day | ORAL | 0 refills | Status: DC

## 2018-10-27 NOTE — Assessment & Plan Note (Signed)
Assessment: Known chronic sinusitis Status post sinus surgery by Dr. [name] Recent rounds of clindamycin for 3 weeks as well as Augmentin for 3 weeks in late 2019 in early 2020 Nontender sinuses on exam today Clear nasal drainage on exam Postnasal drip  Plan: Continue nasal saline rinses Continue Claritin Continue Flonase If sinus symptoms worsen present back to ENT Dr. [name] for further evaluation Follow-up with our office in 6 to 8 weeks

## 2018-10-27 NOTE — Assessment & Plan Note (Signed)
Assessment: Mycetoma seen on January/2020 CT and enlarging compared to previous imaging  Plan: We will continue to monitor patient clinically as well as with close follow-up with our office Follow-up with our office in 6 to 8 weeks

## 2018-10-27 NOTE — Progress Notes (Addendum)
_0  ID: [name], female    DOB: 10-Jun-1956, 63 y.o.   MRN: [id number]  Chief Complaint  Patient presents with  . Follow-up    cough some improvement    Referring provider: [name], NP  HPI:  63 year old female never smoker followed in our office for ABPA, sarcoidosis, cough  PMH: Mycetoma, cough, sarcoid arthropathy, chronic sinusitis - followed by ENT Dr. [name] Smoker/ Smoking History: Never smoker Maintenance: Symbicort 160 Pt of: Dr. Lamonte Sakai  10/28/2018  - Visit   63 year old female never smoker followed in our office for sarcoidosis as well as chronic cough.  Patient reports that her cough has been improved since last office visit.  She still has an occasional cough.  This cough is occasionally productive with clear mucus.  She also occasionally has clear nasal drainage.  This is an improvement from previous office visits when she was treated for her chronic sinusitis and she was having brown or purulent nasal drainage.  She reports that this has helped.  Patient has previously been managed by ENT Dr. [name].  She knows that if she has recurrent sinus symptoms she will need to present back to his office.  She does still continue to do nasal saline rinses twice daily which continue to help with her sinus symptoms.  She reports that her cough typically is worse at night.  Especially when she lies flat.  Primary care has switched her from Protonix to Nuevo.  Patient reports that she takes 30 mg Dexilant daily.  She reports that her coughing episodes at night typically produce stomach acid as well as acid reflux.  Patient reports that this is worse after she eats large and heavy meals.  Patient has never been formally evaluated by gastroenterology.  She is wondering if she needs a larger dose of the Dexilant.  Patient also reports the Dexilant is expensive.  Patient is not following an acid reflux diet.  Patient has been maintained on Symbicort 160 but she  admits that she has not been taking this regularly.  She was not sure if she needs to continue to take 2 puffs twice daily.  Of note patient currently works at [employer].  She has had a lot of stressors at work with layoffs.  She currently is still employed there.  But this is definitely an extensive stressor is affecting her life.  She wants to know if her sarcoidosis is stable or if it is end-stage and should she consider retiring early to "enjoy her life now".  Patient currently is not on any active therapies for sarcoidosis.  In the past she is been treated with prednisone and previously trialed on methotrexate.  She did not tolerate the methotrexate well.   Tests:   08/29/2018-ACE-52 08/29/2018-IgE-50 08/29/2018-CBC with differential-eosinophils relative 7.6, eosinophils absolute 0.5  01/13/2017-pulmonary function test- FVC 2.2 (76% predicted), postbronchodilator ratio 89, postbronchodilator FEV1 1.91 (85% predicted), no significant bronchodilator response, mid flow reversibility after administration of bronchodilator, DLCO 57  bronchoscopy on 07/29/17 that showed diffuse erythema and edema with bronchomalacia. AFB was negative. BAL Aspergillus antigen was 0.63 (elevated),fungal culture showed Aspergillus fumigatus  10/27/2017-IgE-30 12/01/17-IgE-40 12/01/17-rheumatoid factor-less than 14  12/28/2016-CT chest high-res-mediastinal hilar and upper mid lung zone predominant changes of sarcoid, as before with stable mycetoma and apical left upper lobe, basilar pulmonary parenchymal changes of UIP  06/18/17-CT chest with contrast- spectrum of findings compatible with fibrotic interstitial lung disease due to pulmonary sarcoidosis, stable apical left upper lobe mycetoma  09/13/2018-CT  chest without contrast- in addition to evolving changes of sarcoidosis and chronic left upper lobe cavitary with enlarging internal mycetoma there progressively worsened areas of fibrosis which are basilar  predominant in the lungs bilaterally these areas of fibrosis have imaging characteristics concerning for potential concurrent interstitial lung disease specifically suspicious for UIP, aortic arthrosclerosis  09/13/2018-CT maxillofacial- chronic postsurgical changes with new bilateral maxillary sinus fluid suggesting acute sinusitis  FENO:  No results found for: NITRICOXIDE  PFT: PFT Results Latest Ref Rng & Units 01/13/2017  FVC-Pre L 2.20  FVC-Predicted Pre % 76  FVC-Post L 2.16  FVC-Predicted Post % 75  Pre FEV1/FVC % % 83  Post FEV1/FCV % % 89  FEV1-Pre L 1.83  FEV1-Predicted Pre % 81  FEV1-Post L 1.91  DLCO UNC% % 57  DLCO COR %Predicted % 84  TLC L 3.70  TLC % Predicted % 70  RV % Predicted % 66    Imaging: No results found.    Specialty Problems      Pulmonary Problems   Cough    Qualifier: Diagnosis of  By: Lamonte Sakai MD, Rose Fillers       Chronic sinusitis    09/13/2018-CT maxillofacial- chronic postsurgical changes with new bilateral maxillary sinus fluid suggesting acute sinusitis         No Known Allergies  Immunization History  Administered Date(s) Administered  . Influenza Split 06/21/2011, 05/23/2012, 05/24/2013  . Influenza Whole 05/24/2010  . Influenza,inj,Quad PF,6+ Mos 06/07/2015, 06/05/2016, 05/17/2017, 06/02/2018  . Pneumococcal Conjugate-13 06/07/2015   Patient needs Pneumovax 23 at next office visit  Past Medical History:  Diagnosis Date  . Chronic sinusitis   . Cough   . Diabetes mellitus without complication ([place])   . Hypertension   . Sarcoidosis     Tobacco History: Social History   Tobacco Use  Smoking Status Never Smoker  Smokeless Tobacco Never Used   Counseling given: Yes  Continue to not smoke  Outpatient Encounter Medications as of 10/27/2018  Medication Sig  . albuterol (PROVENTIL HFA;VENTOLIN HFA) 108 (90 Base) MCG/ACT inhaler INHALE 1 PUFF INTO THE  LUNGS EVERY 4 HOURS AS  NEEDED FOR WHEEZING OR  SHORTNESS OF BREATH.   Marland Kitchen amoxicillin-clavulanate (AUGMENTIN) 875-125 MG tablet Take 1 tablet by mouth 2 (two) times daily.  Marland Kitchen aspirin 81 MG tablet Take 81 mg by mouth daily.  Marland Kitchen aspirin-acetaminophen-caffeine (EXCEDRIN MIGRAINE) 250-250-65 MG per tablet Take 1 tablet by mouth every 6 (six) hours as needed for pain.  Marland Kitchen atorvastatin (LIPITOR) 20 MG tablet Take 20 mg by mouth daily.  . Cholecalciferol (VITAMIN D-3) 5000 UNITS TABS Take 5,000 Units by mouth daily.  Marland Kitchen Dexlansoprazole (DEXILANT) 30 MG capsule Take 30 mg by mouth daily.  . fenofibrate 160 MG tablet Take 160 mg by mouth daily.  . fish oil-omega-3 fatty acids 1000 MG capsule Take 2 g by mouth daily.  . fluticasone (FLONASE) 50 MCG/ACT nasal spray USE 1 SPRAY INTO BOTH  NOSTRILS DAILY AS NEEDED  . ibuprofen (ADVIL,MOTRIN) 200 MG tablet Take 400 mg by mouth every 6 (six) hours as needed.  . loratadine (CLARITIN) 10 MG tablet Take 1 tablet (10 mg total) by mouth daily.  Marland Kitchen losartan-hydrochlorothiazide (HYZAAR) 50-12.5 MG per tablet Take 1 tablet by mouth daily.  . Multiple Vitamin (MULTIVITAMIN) capsule Take 1 capsule by mouth daily.    . Naproxen Sod-Diphenhydramine (ALEVE PM PO) Take 1 tablet by mouth at bedtime as needed and may repeat dose one time if needed.  . niacin (  NIASPAN) 1000 MG CR tablet Take 1,000 mg by mouth at bedtime.  . SYMBICORT 160-4.5 MCG/ACT inhaler USE 2 PUFFS TWO TIMES DAILY  . budesonide-formoterol (SYMBICORT) 160-4.5 MCG/ACT inhaler Inhale 2 puffs into the lungs 2 (two) times daily for 1 day.  Marland Kitchen dexlansoprazole (DEXILANT) 60 MG capsule Take 1 capsule (60 mg total) by mouth daily. 3 boxes of samples = 15 caps  . pantoprazole (PROTONIX) 40 MG tablet TAKE 1 TABLET BY MOUTH  DAILY (Patient not taking: Reported on 10/27/2018)  . Respiratory Therapy Supplies (FLUTTER) DEVI Use as directed. (Patient not taking: Reported on 10/27/2018)  . [DISCONTINUED] methotrexate (RHEUMATREX) 2.5 MG tablet 5 tablets every 7 days   No facility-administered  encounter medications on file as of 10/27/2018.     Review of Systems  Review of Systems  Constitutional: Negative for chills, fatigue, fever and unexpected weight change.  HENT: Positive for congestion (Clear nasal drainage) and postnasal drip. Negative for sinus pressure and sinus pain.        Does chronic saline rinses   Respiratory: Positive for cough. Negative for chest tightness, shortness of breath and wheezing.   Cardiovascular: Negative for chest pain and palpitations.       Denies orthopnea, denies chest wall pain  Gastrointestinal: Negative for diarrhea, nausea and vomiting.       Worsened cough at night that occurs multiple times a week that has stomach acid and GERD events associated with it  Musculoskeletal: Negative for arthralgias.  Skin: Negative for color change.  Allergic/Immunologic: Negative for environmental allergies and food allergies.  Neurological: Negative for dizziness, light-headedness and headaches.  Psychiatric/Behavioral: Negative for dysphoric mood. The patient is not nervous/anxious.   All other systems reviewed and are negative.    Physical Exam  BP 106/66 (BP Location: Right Arm, Patient Position: Sitting, Cuff Size: Normal)   Pulse 71   Temp 97.8 F (36.6 C)   Ht _0  (1.651 m)   Wt 164 lb 3.2 oz (74.5 kg)   SpO2 97%   BMI 27.32 kg/m   Wt Readings from Last 5 Encounters:  10/27/18 164 lb 3.2 oz (74.5 kg)  09/29/18 160 lb (72.6 kg)  08/29/18 164 lb (74.4 kg)  07/18/18 166 lb (75.3 kg)  12/01/17 163 lb (73.9 kg)     Physical Exam  Constitutional: She is oriented to person, place, and time and well-developed, well-nourished, and in no distress. No distress.  HENT:  Head: Normocephalic and atraumatic.  Right Ear: Hearing, external ear and ear canal normal.  Left Ear: Hearing, external ear and ear canal normal.  Nose: Mucosal edema and rhinorrhea present. Right sinus exhibits no maxillary sinus tenderness and no frontal sinus tenderness.  Left sinus exhibits no maxillary sinus tenderness and no frontal sinus tenderness.  Mouth/Throat: Uvula is midline and oropharynx is clear and moist. No oropharyngeal exudate.  TMs with effusion without infection bilaterally Postnasal drip  Eyes: Pupils are equal, round, and reactive to light.  Neck: Normal range of motion. Neck supple.  Cardiovascular: Normal rate, regular rhythm and normal heart sounds.  Pulmonary/Chest: Effort normal and breath sounds normal. No accessory muscle usage. No respiratory distress. She has no decreased breath sounds. She has no wheezes. She has no rhonchi.  Bibasilar crackles  Abdominal: Soft. Bowel sounds are normal. She exhibits no distension. There is no abdominal tenderness.  Musculoskeletal: Normal range of motion.        General: No edema.  Lymphadenopathy:    She has no cervical adenopathy.  Neurological: She is alert and oriented to person, place, and time. Gait normal.  Skin: Skin is warm and dry. She is not diaphoretic. No erythema.  Psychiatric: Mood, memory, affect and judgment normal.  Nursing note and vitals reviewed.     Lab Results:  CBC    Component Value Date/Time   WBC 6.0 08/29/2018 0953   RBC 4.48 08/29/2018 0953   HGB 12.7 08/29/2018 0953   HCT 38.5 08/29/2018 0953   PLT 228.0 08/29/2018 0953   MCV 85.8 08/29/2018 0953   MCH 27.7 08/07/2016 1021   MCHC 33.1 08/29/2018 0953   RDW 13.7 08/29/2018 0953   LYMPHSABS 1.3 08/29/2018 0953   MONOABS 0.7 08/29/2018 0953   EOSABS 0.5 08/29/2018 0953   BASOSABS 0.1 08/29/2018 0953    BMET    Component Value Date/Time   NA 139 12/01/2017 1720   K 3.7 12/01/2017 1720   CL 104 12/01/2017 1720   CO2 31 12/01/2017 1720   GLUCOSE 115 (H) 12/01/2017 1720   BUN 15 12/01/2017 1720   CREATININE 0.88 12/01/2017 1720   CALCIUM 9.8 12/01/2017 1720   GFRNONAA >60 08/07/2016 1021   GFRAA >60 08/07/2016 1021    BNP No results found for: BNP  ProBNP No results found for:  PROBNP    Assessment & Plan:    GERD without esophagitis Assessment: 3-4 events each week with reflux type episodes status post coughing at night Patient endorses a worsened cough when lying flat in the evening Has seen some improvement since switching from Protonix to Merna.  Plan: Samples of 60 mg Dexilant provided for patient today which she can take in place of her 30 mg daily >>> Trial to see if patient notices clinical improvement on higher dose PPI while she awaits GI evaluation Instructed patient to take PPI 15 to 30 minutes for first meal the day on empty stomach Follow GERD lifestyle changes on AVS Review GERD diet on AVS Discussed my concerns of patient's symptoms with patient.  I would like for her to be evaluated by gastroenterology >>> Ambulatory referral to gastroenterology   Chronic sinusitis Assessment: Known chronic sinusitis Status post sinus surgery by Dr. [name] Recent rounds of clindamycin for 3 weeks as well as Augmentin for 3 weeks in late 2019 in early 2020 Nontender sinuses on exam today Clear nasal drainage on exam Postnasal drip  Plan: Continue nasal saline rinses Continue Claritin Continue Flonase If sinus symptoms worsen present back to ENT Dr. [name] for further evaluation Follow-up with our office in 6 to 8 weeks  Mycetoma Assessment: Mycetoma seen on January/2020 CT and enlarging compared to previous imaging  Plan: We will continue to monitor patient clinically as well as with close follow-up with our office Follow-up with our office in 6 to 8 weeks  Sarcoidosis Assessment: May/2018 pulmonary function test showing an FVC of 2.2 (76% predicted), DLCO 57 Progressing fibrotic disease on recent CT imaging January/2020 CT chest without contrast showing UIP pattern as well as worsened areas of fibrosis Cough has improved but is still persistent with GERD-like symptoms Patient is currently maintained tray now off of  prednisone Patient previously was on methotrexate but did not tolerate well Patient has not been adherent to her Symbicort 160 Lung exam reveals bibasilar crackles  Plan:  Continue Symbicort 160 taken 2 puffs twice daily We will continue to follow the patient closely in our office clinically as well as with repeat imaging. Consider repeat pulmonary function testing to compare to 2018 pulmonary  function test Patient and I discussed her current status of sarcoidosis >>> I do not believe the patient is having active sarcoidosis symptoms or flare today.  I explained to her that she does have fibrotic changes that are directly related to her history of sarcoidosis.  We reviewed her 2018 pulmonary function test as well as the DLCO results.  I do not believe she needs prednisone or steroid sparing agents at this time but these still need to be considered in the future if she starts to have a flare  Follow-up with our office in 6 to 8 weeks >>> Consider Pneumovax 23 at that office visit  Addendum: 10/28/2018 We will discuss case with Dr. Lamonte Sakai in office tomorrow (10/28/2018) >>> I believe it would be important to get repeat pulmonary function testing to further evaluate fibrotic disease as well as her breathing.  This could be helpful to further support starting anti-fibrotic therapy if there is indications for that over the coming months. >>> This can be discussed at [place] office visit with Dr. Lamonte Sakai  Cough Assessment: Known history of chronic sinusitis, recently treated with 3 weeks of clindamycin as well as 3 weeks of Augmentin, symptoms have improved Clear nasal drainage as well as postnasal drip Maintained on Claritin daily Maintained on Flonase daily Does nasal saline rinses twice daily Currently taking Dexilant 30 mg daily for management of acid reflux Patient reporting she still has 3-4 episodes of acid reflux and coughing when lying flat after eating dinner Patient has never been  evaluated by gastroenterology Not adherent to Symbicort 160   Plan: Trial of increasing Dexilant to 60 mg daily, samples provided today Referral to gastroenterology for further evaluation Resume Symbicort 160 taken 2 puffs twice daily Continue Claritin daily Continue nasal saline rinses Continue Flonase Consider going back to ENT if sinusitis symptoms return Keep follow-up with our office in 6 to 8 weeks  Abnormal finding on lung imaging Assessment: Evolving changes of sarcoidosis on January/2022 CT chest Patchy fibrotic disease on January/2020 CT chest, radiologist read suspicious for UIP May/2018 pulmonary function test shows DLCO 57  Plan: We will order pulmonary function test to be completed to further evaluate respiratory changes since 2018 test   Addendum: 10/28/2018 Discussed case with Dr. Lamonte Sakai.  We will proceed forward with pulmonary function testing.  Patient may be a candidate for anti-fibrotic therapy as these medications continue to have new indications actively developing.  To prepare for that I think it is good to get new pulmonary function testing to further assess if there is been changes from May/2018 to now.  As we have seen changes on CT imaging.  This can further be discussed with Dr. Lamonte Sakai at April/2020 office visit.  Lauraine Rinne, NP 10/28/2018   This appointment was 45 min long with over 50% of the time in direct face-to-face patient care, assessment, plan of care, and follow-up.

## 2018-10-27 NOTE — Patient Instructions (Addendum)
Continue Symbicort 160 >>> 2 puffs in the morning right when you wake up, rinse out your mouth after use, 12 hours later 2 puffs, rinse after use >>> Take this daily, no matter what >>> This is not a rescue inhaler   Dexilant 60 mg tablet  >>>Please take 1 tablet daily 15 minutes to 30 minutes before your first meal of the day as well as before your other medications >>>Try to take at the same time each day >>>take this medication daily  GERD management: >>>Avoid laying flat until 2 hours after meals >>>Elevate head of the bed including entire chest >>>Reduce size of meals and amount of fat, acid, spices, caffeine and sweets >>>If you are smoking, Please stop! >>>Decrease alcohol consumption >>>Work on maintaining a healthy weight with normal BMI    Referral placed to GI  Follow-up with our office in 6 to 8 weeks with Dr. Lamonte Sakai preferably if no availability then Wyn Quaker, FNP    It is flu season:   >>> Best ways to protect herself from the flu: Receive the yearly flu vaccine, practice good hand hygiene washing with soap and also using hand sanitizer when available, eat a nutritious meals, get adequate rest, hydrate appropriately   Please contact the office if your symptoms worsen or you have concerns that you are not improving.   Thank you for choosing [facility] for your healthcare, and for allowing [place] to partner with you on your healthcare journey. I am thankful to be able to provide care to you today.   Wyn Quaker FNP-C   Gastroesophageal Reflux Disease, Adult Gastroesophageal reflux (GER) happens when acid from the stomach flows up into the tube that connects the mouth and the stomach (esophagus). Normally, food travels down the esophagus and stays in the stomach to be digested. With GER, food and stomach acid sometimes move back up into the esophagus. You may have a disease called gastroesophageal reflux disease (GERD) if the reflux:  Happens often.  Causes  frequent or very bad symptoms.  Causes problems such as damage to the esophagus. When this happens, the esophagus becomes sore and swollen (inflamed). Over time, GERD can make small holes (ulcers) in the lining of the esophagus. What are the causes? This condition is caused by a problem with the muscle between the esophagus and the stomach. When this muscle is weak or not normal, it does not close properly to keep food and acid from coming back up from the stomach. The muscle can be weak because of:  Tobacco use.  Pregnancy.  Having a certain type of hernia (hiatal hernia).  Alcohol use.  Certain foods and drinks, such as coffee, chocolate, onions, and peppermint. What increases the risk? You are more likely to develop this condition if you:  Are overweight.  Have a disease that affects your connective tissue.  Use NSAID medicines. What are the signs or symptoms? Symptoms of this condition include:  Heartburn.  Difficult or painful swallowing.  The feeling of having a lump in the throat.  A bitter taste in the mouth.  Bad breath.  Having a lot of saliva.  Having an upset or bloated stomach.  Belching.  Chest pain. Different conditions can cause chest pain. Make sure you see your doctor if you have chest pain.  Shortness of breath or noisy breathing (wheezing).  Ongoing (chronic) cough or a cough at night.  Wearing away of the surface of teeth (tooth enamel).  Weight loss. How is this treated?  Treatment will depend on how bad your symptoms are. Your doctor may suggest:  Changes to your diet.  Medicine.  Surgery. Follow these instructions at home: Eating and drinking   Follow a diet as told by your doctor. You may need to avoid foods and drinks such as: ? Coffee and tea (with or without caffeine). ? Drinks that contain alcohol. ? Energy drinks and sports drinks. ? Bubbly (carbonated) drinks or sodas. ? Chocolate and cocoa. ? Peppermint and mint  flavorings. ? Garlic and onions. ? Horseradish. ? Spicy and acidic foods. These include peppers, chili powder, curry powder, vinegar, hot sauces, and BBQ sauce. ? Citrus fruit juices and citrus fruits, such as oranges, lemons, and limes. ? Tomato-based foods. These include red sauce, chili, salsa, and pizza with red sauce. ? Fried and fatty foods. These include donuts, french fries, potato chips, and high-fat dressings. ? High-fat meats. These include hot dogs, rib eye steak, sausage, ham, and bacon. ? High-fat dairy items, such as whole milk, butter, and cream cheese.  Eat small meals often. Avoid eating large meals.  Avoid drinking large amounts of liquid with your meals.  Avoid eating meals during the 2-3 hours before bedtime.  Avoid lying down right after you eat.  Do not exercise right after you eat. Lifestyle   Do not use any products that contain nicotine or tobacco. These include cigarettes, e-cigarettes, and chewing tobacco. If you need help quitting, ask your doctor.  Try to lower your stress. If you need help doing this, ask your doctor.  If you are overweight, lose an amount of weight that is healthy for you. Ask your doctor about a safe weight loss goal. General instructions  Pay attention to any changes in your symptoms.  Take over-the-counter and prescription medicines only as told by your doctor. Do not take aspirin, ibuprofen, or other NSAIDs unless your doctor says it is okay.  Wear loose clothes. Do not wear anything tight around your waist.  Raise (elevate) the head of your bed about 6 inches (15 cm).  Avoid bending over if this makes your symptoms worse.  Keep all follow-up visits as told by your doctor. This is important. Contact a doctor if:  You have new symptoms.  You lose weight and you do not know why.  You have trouble swallowing or it hurts to swallow.  You have wheezing or a cough that keeps happening.  Your symptoms do not get better  with treatment.  You have a hoarse voice. Get help right away if:  You have pain in your arms, neck, jaw, teeth, or back.  You feel sweaty, dizzy, or light-headed.  You have chest pain or shortness of breath.  You throw up (vomit) and your throw-up looks like blood or coffee grounds.  You pass out (faint).  Your poop (stool) is bloody or black.  You cannot swallow, drink, or eat. Summary  If a person has gastroesophageal reflux disease (GERD), food and stomach acid move back up into the esophagus and cause symptoms or problems such as damage to the esophagus.  Treatment will depend on how bad your symptoms are.  Follow a diet as told by your doctor.  Take all medicines only as told by your doctor. This information is not intended to replace advice given to you by your health care provider. Make sure you discuss any questions you have with your health care provider. Document Released: 01/27/2008 Document Revised: 02/16/2018 Document Reviewed: 02/16/2018 Elsevier Interactive Patient Education  2019 [employer] for Gastroesophageal Reflux Disease, Adult When you have gastroesophageal reflux disease (GERD), the foods you eat and your eating habits are very important. Choosing the right foods can help ease your discomfort. Think about working with a nutrition specialist (dietitian) to help you make good choices. What are tips for following this plan?  Meals  Choose healthy foods that are low in fat, such as fruits, vegetables, whole grains, low-fat dairy products, and lean meat, fish, and poultry.  Eat small meals often instead of 3 large meals a day. Eat your meals slowly, and in a place where you are relaxed. Avoid bending over or lying down until 2-3 hours after eating.  Avoid eating meals 2-3 hours before bed.  Avoid drinking a lot of liquid with meals.  Cook foods using methods other than frying. Bake, grill, or broil food instead.  Avoid or  limit: ? Chocolate. ? Peppermint or spearmint. ? Alcohol. ? Pepper. ? Black and decaffeinated coffee. ? Black and decaffeinated tea. ? Bubbly (carbonated) soft drinks. ? Caffeinated energy drinks and soft drinks.  Limit high-fat foods such as: ? Fatty meat or fried foods. ? Whole milk, cream, butter, or ice cream. ? Nuts and nut butters. ? Pastries, donuts, and sweets made with butter or shortening.  Avoid foods that cause symptoms. These foods may be different for everyone. Common foods that cause symptoms include: ? Tomatoes. ? Oranges, lemons, and limes. ? Peppers. ? Spicy food. ? Onions and garlic. ? Vinegar. Lifestyle  Maintain a healthy weight. Ask your doctor what weight is healthy for you. If you need to lose weight, work with your doctor to do so safely.  Exercise for at least 30 minutes for 5 or more days each week, or as told by your doctor.  Wear loose-fitting clothes.  Do not smoke. If you need help quitting, ask your doctor.  Sleep with the head of your bed higher than your feet. Use a wedge under the mattress or blocks under the bed frame to raise the head of the bed. Summary  When you have gastroesophageal reflux disease (GERD), food and lifestyle choices are very important in easing your symptoms.  Eat small meals often instead of 3 large meals a day. Eat your meals slowly, and in a place where you are relaxed.  Limit high-fat foods such as fatty meat or fried foods.  Avoid bending over or lying down until 2-3 hours after eating.  Avoid peppermint and spearmint, caffeine, alcohol, and chocolate. This information is not intended to replace advice given to you by your health care provider. Make sure you discuss any questions you have with your health care provider. Document Released: 02/09/2012 Document Revised: 09/15/2016 Document Reviewed: 09/15/2016 Elsevier Interactive Patient Education  2019 [employer].

## 2018-10-27 NOTE — Assessment & Plan Note (Addendum)
Assessment: Known history of chronic sinusitis, recently treated with 3 weeks of clindamycin as well as 3 weeks of Augmentin, symptoms have improved Clear nasal drainage as well as postnasal drip Maintained on Claritin daily Maintained on Flonase daily Does nasal saline rinses twice daily Currently taking Dexilant 30 mg daily for management of acid reflux Patient reporting she still has 3-4 episodes of acid reflux and coughing when lying flat after eating dinner Patient has never been evaluated by gastroenterology Not adherent to Symbicort 160   Plan: Trial of increasing Dexilant to 60 mg daily, samples provided today Referral to gastroenterology for further evaluation Resume Symbicort 160 taken 2 puffs twice daily Continue Claritin daily Continue nasal saline rinses Continue Flonase Consider going back to ENT if sinusitis symptoms return Keep follow-up with our office in 6 to 8 weeks

## 2018-10-27 NOTE — Assessment & Plan Note (Addendum)
Assessment: TXH/7414 pulmonary function test showing an FVC of 2.2 (76% predicted), DLCO 57 Progressing fibrotic disease on recent CT imaging January/2020 CT chest without contrast showing UIP pattern as well as worsened areas of fibrosis Cough has improved but is still persistent with GERD-like symptoms Patient is currently maintained tray now off of prednisone Patient previously was on methotrexate but did not tolerate well Patient has not been adherent to her Symbicort 160 Lung exam reveals bibasilar crackles  Plan:  Continue Symbicort 160 taken 2 puffs twice daily We will continue to follow the patient closely in our office clinically as well as with repeat imaging. Consider repeat pulmonary function testing to compare to 2018 pulmonary function test Patient and I discussed her current status of sarcoidosis >>> I do not believe the patient is having active sarcoidosis symptoms or flare today.  I explained to her that she does have fibrotic changes that are directly related to her history of sarcoidosis.  We reviewed her 2018 pulmonary function test as well as the DLCO results.  I do not believe she needs prednisone or steroid sparing agents at this time but these still need to be considered in the future if she starts to have a flare  Follow-up with our office in 6 to 8 weeks >>> Consider Pneumovax 23 at that office visit  Addendum: 10/28/2018 We will discuss case with Dr. Lamonte Sakai in office tomorrow (10/28/2018) >>> I believe it would be important to get repeat pulmonary function testing to further evaluate fibrotic disease as well as her breathing.  This could be helpful to further support starting anti-fibrotic therapy if there is indications for that over the coming months. >>> This can be discussed at [place] office visit with Dr. Lamonte Sakai

## 2018-10-27 NOTE — Assessment & Plan Note (Signed)
Assessment: 3-4 events each week with reflux type episodes status post coughing at night Patient endorses a worsened cough when lying flat in the evening Has seen some improvement since switching from Protonix to [place].  Plan: Samples of 60 mg Dexilant provided for patient today which she can take in place of her 30 mg daily >>> Trial to see if patient notices clinical improvement on higher dose PPI while she awaits GI evaluation Instructed patient to take PPI 15 to 30 minutes for first meal the day on empty stomach Follow GERD lifestyle changes on AVS Review GERD diet on AVS Discussed my concerns of patient's symptoms with patient.  I would like for her to be evaluated by gastroenterology >>> Ambulatory referral to gastroenterology

## 2018-10-28 ENCOUNTER — Telehealth: Payer: Self-pay | Admitting: Pulmonary Disease

## 2018-10-28 DIAGNOSIS — D869 Sarcoidosis, unspecified: Secondary | ICD-10-CM

## 2018-10-28 DIAGNOSIS — R918 Other nonspecific abnormal finding of lung field: Secondary | ICD-10-CM | POA: Insufficient documentation

## 2018-10-28 NOTE — Assessment & Plan Note (Signed)
Assessment: Evolving changes of sarcoidosis on January/2022 CT chest Patchy fibrotic disease on January/2020 CT chest, radiologist read suspicious for UIP May/2018 pulmonary function test shows DLCO 57  Plan: We will order pulmonary function test to be completed to further evaluate respiratory changes since 2018 test

## 2018-10-28 NOTE — Telephone Encounter (Signed)
10/28/2018 1032  Left message for patient on her mobile device.  I discussed her case with Dr. Lamonte Sakai today.  We will get patient set up for a breathing test.  To further evaluate her breathing in comparison to her 2018 pulmonary function test.  Ideally this would be a great test to get scheduled prior to her office visit with Dr. Lamonte Sakai in April/2020.   Left message for the patient to call our office back if she has additional questions or concerns.  Requested the patient to ask for Kathline Magic or myself.  Order for the pulmonary function test has been placed.  Will route to Ander Purpura and Langley Gauss for follow-up.  To ensure the patient get scheduled.  Wyn Quaker, FNP

## 2018-10-31 NOTE — Telephone Encounter (Signed)
ATC patient on Mobile, VM is full. Will try again later

## 2018-11-04 NOTE — Telephone Encounter (Signed)
PFT has been scheduled prior to OV with RB. Patient inquired as to when she would be contacted by GI. Phone call made to [facility], they will be reaching out to the patient within the next week. Made patient aware. Voiced understanding. Nothing further is needed at this time.

## 2018-11-14 ENCOUNTER — Other Ambulatory Visit: Payer: Self-pay | Admitting: Emergency Medicine

## 2018-12-21 ENCOUNTER — Ambulatory Visit: Payer: 59 | Admitting: Emergency Medicine

## 2019-01-02 ENCOUNTER — Encounter: Payer: Self-pay | Admitting: Internal Medicine

## 2019-01-02 ENCOUNTER — Ambulatory Visit (INDEPENDENT_AMBULATORY_CARE_PROVIDER_SITE_OTHER): Payer: 59 | Admitting: Internal Medicine

## 2019-01-02 ENCOUNTER — Other Ambulatory Visit: Payer: Self-pay

## 2019-01-02 VITALS — Ht 65.0 in | Wt 165.0 lb

## 2019-01-02 DIAGNOSIS — R05 Cough: Secondary | ICD-10-CM | POA: Diagnosis not present

## 2019-01-02 DIAGNOSIS — D869 Sarcoidosis, unspecified: Secondary | ICD-10-CM

## 2019-01-02 DIAGNOSIS — B479 Mycetoma, unspecified: Secondary | ICD-10-CM

## 2019-01-02 DIAGNOSIS — R053 Chronic cough: Secondary | ICD-10-CM

## 2019-01-02 DIAGNOSIS — K219 Gastro-esophageal reflux disease without esophagitis: Secondary | ICD-10-CM | POA: Diagnosis not present

## 2019-01-02 DIAGNOSIS — Z1211 Encounter for screening for malignant neoplasm of colon: Secondary | ICD-10-CM

## 2019-01-02 DIAGNOSIS — J849 Interstitial pulmonary disease, unspecified: Secondary | ICD-10-CM

## 2019-01-02 MED ORDER — METOCLOPRAMIDE HCL 10 MG PO TABS: 10.0000 mg | ORAL_TABLET | Freq: Every day | ORAL | 0 refills | Status: DC

## 2019-01-02 MED ORDER — FAMOTIDINE 20 MG PO TABS: 40.0000 mg | ORAL_TABLET | Freq: Every day | ORAL | Status: DC

## 2019-01-02 NOTE — Patient Instructions (Addendum)
It was nice to see you today, I am sorry you are struggling with this chronic cough.  As I explained your situation is complicated with multiple possible causes.  That said we can more aggressively treat your reflux that is suspected by adding famotidine 40 mg at bedtime and metoclopramide 10 mg at bedtime.  The famotidine or generic Pepcid is not covered by your insurance plan as a prescription but is not too expensive over-the-counter.  Purchase 20 mg tablets and take 2 at bedtime.  The metoclopramide is a prescription medication and generic and should be cheap.  Additionally please wait at least 3 hours between last food or drink and lying down.  Elevating the head of bed with blocks or a wedge as we talked about there is also worth doing.  A wedge may be purchased through [employer] or perhaps a medical supply store.  Further investigation with x-rays or other studies looking for reflux problems may be worthwhile but we have decided to wait on those.   As you will see I have colon cancer screening listed as one of your visit diagnoses today, I did not discuss that with you but it has been more than 10 years since her last colonoscopy, and at some point it would make sense to repeat 1 of those or perform another type of screening test such as a stool based test unless you have already done this.  We can talk about that next time.   I appreciate the opportunity to care for you. Gatha Mayer, MD, [facility] Gastroesophageal Reflux Disease, Adult When you have gastroesophageal reflux disease (GERD), the foods you eat and your eating habits are very important. Choosing the right foods can help ease the discomfort of GERD. Consider working with a diet and nutrition specialist (dietitian) to help you make healthy food choices. What general guidelines should I follow?  Eating plan  Choose healthy foods low in fat, such as fruits, vegetables, whole grains, low-fat dairy products, and  lean meat, fish, and poultry.  Eat frequent, small meals instead of three large meals each day. Eat your meals slowly, in a relaxed setting. Avoid bending over or lying down until 2-3 hours after eating.  Limit high-fat foods such as fatty meats or fried foods.  Limit your intake of oils, butter, and shortening to less than 8 teaspoons each day.  Avoid the following: ? Foods that cause symptoms. These may be different for different people. Keep a food diary to keep track of foods that cause symptoms. ? Alcohol. ? Drinking large amounts of liquid with meals. ? Eating meals during the 2-3 hours before bed.  Cook foods using methods other than frying. This may include baking, grilling, or broiling. Lifestyle  Maintain a healthy weight. Ask your health care provider what weight is healthy for you. If you need to lose weight, work with your health care provider to do so safely.  Exercise for at least 30 minutes on 5 or more days each week, or as told by your health care provider.  Avoid wearing clothes that fit tightly around your waist and chest.  Do not use any products that contain nicotine or tobacco, such as cigarettes and e-cigarettes. If you need help quitting, ask your health care provider.  Sleep with the head of your bed raised. Use a wedge under the mattress or blocks under the bed frame to raise the head of the bed. What foods are not recommended? The items  listed may not be a complete list. Talk with your dietitian about what dietary choices are best for you. Grains Pastries or quick breads with added fat. [place] toast. Vegetables Deep fried vegetables. [place] fries. Any vegetables prepared with added fat. Any vegetables that cause symptoms. For some people this may include tomatoes and tomato products, chili peppers, onions and garlic, and horseradish. Fruits Any fruits prepared with added fat. Any fruits that cause symptoms. For some people this may include citrus fruits,  such as oranges, grapefruit, pineapple, and lemons. Meats and other protein foods High-fat meats, such as fatty beef or pork, hot dogs, ribs, ham, sausage, salami and bacon. Fried meat or protein, including fried fish and fried chicken. Nuts and nut butters. Dairy Whole milk and chocolate milk. Sour cream. Cream. Ice cream. Cream cheese. Milk shakes. Beverages Coffee and tea, with or without caffeine. Carbonated beverages. Sodas. Energy drinks. Fruit juice made with acidic fruits (such as orange or grapefruit). Tomato juice. Alcoholic drinks. Fats and oils Butter. Margarine. Shortening. Ghee. Sweets and desserts Chocolate and cocoa. Donuts. Seasoning and other foods Pepper. Peppermint and spearmint. Any condiments, herbs, or seasonings that cause symptoms. For some people, this may include curry, hot sauce, or vinegar-based salad dressings. Summary  When you have gastroesophageal reflux disease (GERD), food and lifestyle choices are very important to help ease the discomfort of GERD.  Eat frequent, small meals instead of three large meals each day. Eat your meals slowly, in a relaxed setting. Avoid bending over or lying down until 2-3 hours after eating.  Limit high-fat foods such as fatty meat or fried foods. This information is not intended to replace advice given to you by your health care provider. Make sure you discuss any questions you have with your health care provider. Document Released: 08/10/2005 Document Revised: 08/11/2016 Document Reviewed: 08/11/2016 Elsevier Interactive Patient Education  2019 [employer].  Gastroesophageal Reflux Disease, Adult Gastroesophageal reflux (GER) happens when acid from the stomach flows up into the tube that connects the mouth and the stomach (esophagus). Normally, food travels down the esophagus and stays in the stomach to be digested. However, when a person has GER, food and stomach acid sometimes move back up into the esophagus. If this  becomes a more serious problem, the person may be diagnosed with a disease called gastroesophageal reflux disease (GERD). GERD occurs when the reflux:  Happens often.  Causes frequent or severe symptoms.  Causes problems such as damage to the esophagus. When stomach acid comes in contact with the esophagus, the acid may cause soreness (inflammation) in the esophagus. Over time, GERD may create small holes (ulcers) in the lining of the esophagus. What are the causes? This condition is caused by a problem with the muscle between the esophagus and the stomach (lower esophageal sphincter, or LES). Normally, the LES muscle closes after food passes through the esophagus to the stomach. When the LES is weakened or abnormal, it does not close properly, and that allows food and stomach acid to go back up into the esophagus. The LES can be weakened by certain dietary substances, medicines, and medical conditions, including:  Tobacco use.  Pregnancy.  Having a hiatal hernia.  Alcohol use.  Certain foods and beverages, such as coffee, chocolate, onions, and peppermint. What increases the risk? You are more likely to develop this condition if you:  Have an increased body weight.  Have a connective tissue disorder.  Use NSAID medicines. What are the signs or symptoms? Symptoms of this  condition include:  Heartburn.  Difficult or painful swallowing.  The feeling of having a lump in the throat.  Abitter taste in the mouth.  Bad breath.  Having a large amount of saliva.  Having an upset or bloated stomach.  Belching.  Chest pain. Different conditions can cause chest pain. Make sure you see your health care provider if you experience chest pain.  Shortness of breath or wheezing.  Ongoing (chronic) cough or a night-time cough.  Wearing away of tooth enamel.  Weight loss. How is this diagnosed? Your health care provider will take a medical history and perform a physical exam.  To determine if you have mild or severe GERD, your health care provider may also monitor how you respond to treatment. You may also have tests, including:  A test to examine your stomach and esophagus with a small camera (endoscopy).  A test thatmeasures the acidity level in your esophagus.  A test thatmeasures how much pressure is on your esophagus.  A barium swallow or modified barium swallow test to show the shape, size, and functioning of your esophagus. How is this treated? The goal of treatment is to help relieve your symptoms and to prevent complications. Treatment for this condition may vary depending on how severe your symptoms are. Your health care provider may recommend:  Changes to your diet.  Medicine.  Surgery. Follow these instructions at home: Eating and drinking   Follow a diet as recommended by your health care provider. This may involve avoiding foods and drinks such as: ? Coffee and tea (with or without caffeine). ? Drinks that containalcohol. ? Energy drinks and sports drinks. ? Carbonated drinks or sodas. ? Chocolate and cocoa. ? Peppermint and mint flavorings. ? Garlic and onions. ? Horseradish. ? Spicy and acidic foods, including peppers, chili powder, curry powder, vinegar, hot sauces, and barbecue sauce. ? Citrus fruit juices and citrus fruits, such as oranges, lemons, and limes. ? Tomato-based foods, such as red sauce, chili, salsa, and pizza with red sauce. ? Fried and fatty foods, such as donuts, french fries, potato chips, and high-fat dressings. ? High-fat meats, such as hot dogs and fatty cuts of red and white meats, such as rib eye steak, sausage, ham, and bacon. ? High-fat dairy items, such as whole milk, butter, and cream cheese.  Eat small, frequent meals instead of large meals.  Avoid drinking large amounts of liquid with your meals.  Avoid eating meals during the 2-3 hours before bedtime.  Avoid lying down right after you eat.  Do  not exercise right after you eat. Lifestyle   Do not use any products that contain nicotine or tobacco, such as cigarettes, e-cigarettes, and chewing tobacco. If you need help quitting, ask your health care provider.  Try to reduce your stress by using methods such as yoga or meditation. If you need help reducing stress, ask your health care provider.  If you are overweight, reduce your weight to an amount that is healthy for you. Ask your health care provider for guidance about a safe weight loss goal. General instructions  Pay attention to any changes in your symptoms.  Take over-the-counter and prescription medicines only as told by your health care provider. Do not take aspirin, ibuprofen, or other NSAIDs unless your health care provider told you to do so.  Wear loose-fitting clothing. Do not wear anything tight around your waist that causes pressure on your abdomen.  Raise (elevate) the head of your bed about 6 inches (15  cm).  Avoid bending over if this makes your symptoms worse.  Keep all follow-up visits as told by your health care provider. This is important. Contact a health care provider if:  You have: ? New symptoms. ? Unexplained weight loss. ? Difficulty swallowing or it hurts to swallow. ? Wheezing or a persistent cough. ? A hoarse voice.  Your symptoms do not improve with treatment. Get help right away if you:  Have pain in your arms, neck, jaw, teeth, or back.  Feel sweaty, dizzy, or light-headed.  Have chest pain or shortness of breath.  Vomit and your vomit looks like blood or coffee grounds.  Faint.  Have stool that is bloody or black.  Cannot swallow, drink, or eat. Summary  Gastroesophageal reflux happens when acid from the stomach flows up into the esophagus. GERD is a disease in which the reflux happens often, causes frequent or severe symptoms, or causes problems such as damage to the esophagus.  Treatment for this condition may vary  depending on how severe your symptoms are. Your health care provider may recommend diet and lifestyle changes, medicine, or surgery.  Contact a health care provider if you have new or worsening symptoms.  Take over-the-counter and prescription medicines only as told by your health care provider. Do not take aspirin, ibuprofen, or other NSAIDs unless your health care provider told you to do so.  Keep all follow-up visits as told by your health care provider. This is important. This information is not intended to replace advice given to you by your health care provider. Make sure you discuss any questions you have with your health care provider. Document Released: 05/20/2005 Document Revised: 02/16/2018 Document Reviewed: 02/16/2018 Elsevier Interactive Patient Education  2019 [employer].

## 2019-01-02 NOTE — Progress Notes (Signed)
TELEHEALTH ENCOUNTER IN SETTING OF COVID-19 PANDEMIC - REQUESTED BY PATIENT SERVICE PROVIDED BY TELEMEDECINE - TYPE: Zoom A/V PATIENT LOCATION: Work PATIENT HAS CONSENTED TO TELEHEALTH VISIT PROVIDER LOCATION: OFFICE REFERRING PROVIDER: Wyn Quaker, NP and Dr. Baltazar Leach PARTICIPANTS OTHER THAN PATIENT: None TIME SPENT ON CALL: 30 minutes    [name] 63 y.o. [birth year] [id number]  Assessment & Plan:   Encounter Diagnoses  Name Primary?  . Chronic cough Yes  . Gastroesophageal reflux disease suspected   . Sarcoidosis   . Interstitial pneumonia ([place]) suspected on CT scan   . Mycetoma suspected on CT scan   . Colon cancer screening     Chronic cough My sense is this is probably not due to reflux no it could be.  Factors going against is that it is a somewhat wet cough, she has underlying lung disease, and it has not responded to aggressive acid suppression with 60 mg of Dexilant for the past 2 months or more.  Plus she has been on other acid suppressive agents.  She also has a lot of sinusitis and drainage sensation.  While sometimes that is linked to reflux in most cases if it is reflux it responds to medication.  That said, it is reasonable to treat more aggressively for reflux.  Lifestyle changes with raising the head of bed either with blocks or a wedge, 3 or more hours between last food and lying down, and adding famotidine 40 mg at bedtime and metoclopramide 10 mg at bedtime will be done.  GERD diet and instructions will be provided through my chart in the after visit summary.  Anticipate following up with me in late June.  I did suggest an upper GI series would be helpful to ascertain as to whether she had a hiatal hernia and understand about regurgitation and reflux further as some patients have a lot of reflux and it does not necessarily show up is heartburn and it could be getting up into the laryngeal area and causing her problems and even causing microaspiration  perhaps.  Straight upper endoscopy is seldom helpful in this situation in my experience and opinion.  I also talked about considering EGD with Bravo pH probe placement and having that testing done, or possibly pH impedance testing with manometry.  She is not inclined to do x-ray or procedures right now with the coronavirus restrictions and concerns.  She would prefer less exposure right now.  I do not think any of these potential diagnostic evaluations are urgent.  If she had a significant anatomic abnormality such as a large hiatal hernia then could consider surgical intervention.  Another possibility if she does not have a large hiatal hernia but we thought she was having regurgitation problems would be an endoscopic TIF procedure.  We are nowhere close to resorting to that however is those are extremely aggressive maneuvers and I am not confident this problem is from GERD.  Discussed colon cancer screening options when she has follow-up in about 6 weeks.  This is not the highest priority obviously but something that should be followed up on.  I appreciate the opportunity to care for this patient. CC: Dana Beals, NP Dana Quaker, NP Dr. Baltazar Leach  Subjective:   Chief Complaint: Cough  HPI The patient is a 63 year old African-American woman with sarcoidosis, interstitial lung disease, chronic sinusitis and a chronic cough that is thought might be due to GERD.  She has been on PPI therapy for some time, months if  not longer, and in March she saw Mr. [name] and had Dexilant changed from 30 mg daily to 60 mg daily.  She continues with a mildly productive wet cough.  She has hoarseness.  Raspy voice.  She does not think that it is responded to reflux measures.  She does not drink a lot of caffeine perhaps a couple coffee some days.  She does not wait 3 hours between reclining after her last meal.  She does not have dysphagia.  When she does lie down at night she will feel reflux and regurgitation.   Sometimes she coughs so much she will bring her food up.  CT scanning of the chest was repeated in January of this year and Dr. Lamonte Leach that things were relatively stable he did not see active sarcoid though she has a suspected mycetoma he does not think she has allergic pulmonary bronchovascular aspergillosis based upon previous treatment and testing.  She read somewhere where her hernia could be related and it sounds like she has a periumbilical hernia that might be getting a little worse with a chronic cough.  She does not have any known hiatal hernia based upon her CT scan, she is never had an upper GI, she had a colonoscopy in 2009.  She does try to suck on lozenges and minimize the cough but that does not really help.  She has a postnasal drainage type syndrome or sensation as well.  It sounds like clindamycin is helped her sinusitis and cough at times though it never completely goes away.  She has never been a smoker.  She is reporting a considerable amount of work stress with pandemic.  She is a Biomedical engineer.  ACE level was normal at 52 in January.  IgE levels are normal, not elevated. No Known Allergies Current Meds  Medication Sig  . albuterol (PROVENTIL HFA;VENTOLIN HFA) 108 (90 Base) MCG/ACT inhaler INHALE 1 PUFF INTO THE  LUNGS EVERY 4 HOURS AS  NEEDED FOR WHEEZING OR  SHORTNESS OF BREATH.  Marland Kitchen aspirin 81 MG tablet Take 81 mg by mouth daily.  Marland Kitchen aspirin-acetaminophen-caffeine (EXCEDRIN MIGRAINE) 250-250-65 MG per tablet Take 1 tablet by mouth every 6 (six) hours as needed for pain.  Marland Kitchen atorvastatin (LIPITOR) 20 MG tablet Take 20 mg by mouth daily.  . budesonide-formoterol (SYMBICORT) 160-4.5 MCG/ACT inhaler Inhale 2 puffs into the lungs 2 (two) times daily for 1 day.  . Cholecalciferol (VITAMIN D-3) 5000 UNITS TABS Take 5,000 Units by mouth daily.  Marland Kitchen dexlansoprazole (DEXILANT) 60 MG capsule Take 1 capsule (60 mg total) by mouth daily. 3 boxes of samples = 15 caps  . fenofibrate 160  MG tablet Take 160 mg by mouth daily.  . fish oil-omega-3 fatty acids 1000 MG capsule Take 2 g by mouth daily.  . fluticasone (FLONASE) 50 MCG/ACT nasal spray USE 1 SPRAY INTO BOTH  NOSTRILS DAILY AS NEEDED  . ibuprofen (ADVIL,MOTRIN) 200 MG tablet Take 400 mg by mouth every 6 (six) hours as needed.  . loratadine (CLARITIN) 10 MG tablet Take 1 tablet (10 mg total) by mouth daily.  Marland Kitchen losartan-hydrochlorothiazide (HYZAAR) 50-12.5 MG per tablet Take 1 tablet by mouth daily.  . Multiple Vitamin (MULTIVITAMIN) capsule Take 1 capsule by mouth daily.    . Naproxen Sod-Diphenhydramine (ALEVE PM PO) Take 1 tablet by mouth at bedtime as needed and may repeat dose one time if needed.  . niacin (NIASPAN) 1000 MG CR tablet Take 1,000 mg by mouth at bedtime.  Dellis Anes  160-4.5 MCG/ACT inhaler USE 2 PUFFS TWO TIMES DAILY   Past Medical History:  Diagnosis Date  . Chronic sinusitis   . Cough   . Diabetes mellitus without complication ([place])   . Diverticulosis 2009   mild sigmoid  . Hypertension   . Sarcoidosis    Past Surgical History:  Procedure Laterality Date  . COLONOSCOPY    . LYMPHADENECTOMY    . NOSE SURGERY    . TUBAL LIGATION    . VESICOVAGINAL FISTULA CLOSURE W/ TAH    . VIDEO BRONCHOSCOPY Bilateral 07/29/2017   Procedure: VIDEO BRONCHOSCOPY WITH FLUORO;  Surgeon: Collene Gobble, MD;  Location: [facility];  Service: Cardiopulmonary;  Laterality: Bilateral;   Social History   Social History Narrative   Patient lives at home alone.  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   Review of Systems As per HPI.  She does have some sarcoid arthropathy as well.  The remainder the  review of systems appears negative.  Wt Readings from Last 3 Encounters:  01/02/19 165 lb (74.8 kg)  10/27/18 164 lb 3.2 oz (74.5 kg)  09/29/18 160 lb (72.6 kg)

## 2019-01-02 NOTE — Assessment & Plan Note (Signed)
My sense is this is probably not due to reflux no it could be.  Factors going against is that it is a somewhat wet cough, she has underlying lung disease, and it has not responded to aggressive acid suppression with 60 mg of Dexilant for the past 2 months or more.  Plus she has been on other acid suppressive agents.  She also has a lot of sinusitis and drainage sensation.  While sometimes that is linked to reflux in most cases if it is reflux it responds to medication.  That said, it is reasonable to treat more aggressively for reflux.  Lifestyle changes with raising the head of bed either with blocks or a wedge, 3 or more hours between last food and lying down, and adding famotidine 40 mg at bedtime and metoclopramide 10 mg at bedtime will be done.  GERD diet and instructions will be provided through my chart in the after visit summary.  Anticipate following up with me in late June.  I did suggest an upper GI series would be helpful to ascertain as to whether she had a hiatal hernia and understand about regurgitation and reflux further as some patients have a lot of reflux and it does not necessarily show up is heartburn and it could be getting up into the laryngeal area and causing her problems and even causing microaspiration perhaps.  Straight upper endoscopy is seldom helpful in this situation in my experience and opinion.  I also talked about considering EGD with Bravo pH probe placement and having that testing done, or possibly pH impedance testing with manometry.  She is not inclined to do x-ray or procedures right now with the coronavirus restrictions and concerns.  She would prefer less exposure right now.  I do not think any of these potential diagnostic evaluations are urgent.  If she had a significant anatomic abnormality such as a large hiatal hernia then could consider surgical intervention.  Another possibility if she does not have a large hiatal hernia but we thought she was having  regurgitation problems would be an endoscopic TIF procedure.  We are nowhere close to resorting to that however is those are extremely aggressive maneuvers and I am not confident this problem is from GERD.

## 2019-01-12 ENCOUNTER — Other Ambulatory Visit: Payer: Self-pay | Admitting: Emergency Medicine

## 2019-02-21 ENCOUNTER — Telehealth: Payer: Self-pay

## 2019-02-21 NOTE — Telephone Encounter (Signed)
Covid-19 screening questions   Do you now or have you had a fever in the last 14 days? no  Do you have any respiratory symptoms of shortness of breath or cough now or in the last 14 days? Long existing cough  Do you have any family members or close contacts with diagnosed or suspected Covid-19 in the past 14 days? no  Have you been tested for Covid-19 and found to be positive? no

## 2019-02-22 ENCOUNTER — Ambulatory Visit (INDEPENDENT_AMBULATORY_CARE_PROVIDER_SITE_OTHER): Payer: 59 | Admitting: Internal Medicine

## 2019-02-22 ENCOUNTER — Encounter: Payer: Self-pay | Admitting: Internal Medicine

## 2019-02-22 VITALS — BP 120/60 | HR 69 | Temp 97.7°F | Ht 65.0 in | Wt 160.0 lb

## 2019-02-22 DIAGNOSIS — R05 Cough: Secondary | ICD-10-CM

## 2019-02-22 DIAGNOSIS — K219 Gastro-esophageal reflux disease without esophagitis: Secondary | ICD-10-CM

## 2019-02-22 DIAGNOSIS — R053 Chronic cough: Secondary | ICD-10-CM

## 2019-02-22 DIAGNOSIS — R0982 Postnasal drip: Secondary | ICD-10-CM | POA: Insufficient documentation

## 2019-02-22 DIAGNOSIS — M6208 Separation of muscle (nontraumatic), other site: Secondary | ICD-10-CM | POA: Insufficient documentation

## 2019-02-22 DIAGNOSIS — Z1211 Encounter for screening for malignant neoplasm of colon: Secondary | ICD-10-CM | POA: Insufficient documentation

## 2019-02-22 MED ORDER — TRAMADOL HCL 50 MG PO TABS: 50.0000 mg | ORAL_TABLET | Freq: Every evening | ORAL | 0 refills | Status: DC | PRN

## 2019-02-22 NOTE — Assessment & Plan Note (Addendum)
ENT eval for her complaints of this.

## 2019-02-22 NOTE — Assessment & Plan Note (Addendum)
I doubt this is from reflux.  She is on maximal therapy unfortunately could not tolerate the addition of metoclopramide.  I have looked at her CT scans and I do not see a hiatal hernia or significant anatomic abnormality.  I reviewed diagnostic options for GERD again that include EGD with Bravo probe and a 24-hour pH and impedance study.  Given that she has a sensation of a postnasal drip problem we have decided for her to see Dr. Jerrell Belfast whom she has seen in the past to see if he can add anything.  She has been fairly miserable so I am going to give her a one-time prescription of tramadol to suppress her cough at night and to provide better sleep perhaps.  She already works on hard candies and using water and other conservative measures.  Her lung problems may be driving all of this and if there are additional therapies for that that may be necessary though is my sense that will be difficult.

## 2019-02-22 NOTE — Patient Instructions (Addendum)
  Please stop your reglan since it made you dizzy.   Please complete your cologard that you got from your PCP.   We have made you an appointment with [facility] on 03/07/2019 at 8:30, arrive at 8:00AM to fill out papers. They are located at [address], [place] [address]   Their phone number is [phone].    I appreciate the opportunity to care for you. Silvano Rusk, MD, [facility]

## 2019-02-22 NOTE — Assessment & Plan Note (Signed)
ENT referral

## 2019-02-22 NOTE — Assessment & Plan Note (Signed)
I reviewed images of her CT scans, upper abdomen included I do not think this is a hernia I think it is a diastases recti.  Coughing is exacerbating this.

## 2019-02-22 NOTE — Progress Notes (Signed)
[name] 63 y.o. Oct 11, 1955 [id number]  Assessment & Plan:  Chronic cough I doubt this is from reflux.  She is on maximal therapy unfortunately could not tolerate the addition of metoclopramide.  I have looked at her CT scans and I do not see a hiatal hernia or significant anatomic abnormality.  I reviewed diagnostic options for GERD again that include EGD with Bravo probe and a 24-hour pH and impedance study.  Given that she has a sensation of a postnasal drip problem we have decided for her to see Dr. Jerrell Belfast whom she has seen in the past to see if he can add anything.  She has been fairly miserable so I am going to give her a one-time prescription of tramadol to suppress her cough at night and to provide better sleep perhaps.  She already works on hard candies and using water and other conservative measures.  Her lung problems may be driving all of this and if there are additional therapies for that that may be necessary though is my sense that will be difficult.  Post-nasal drip ENT eval for her complaints of this.  GERD without esophagitis? Not clear this is cause of cough - doubt given the level of therapy she is on and persistence of symptoms.  Certainly can have a component of reflux however.  In order to determine this upper endoscopy with Bravo pH probe placement versus 24-hour pH and impedance study would be necessary and I would do on therapy.  She is considering this though I am not strongly recommending it based upon the clinical scenario.  Colon cancer screening Doing Cologuard  Chronic sinusitis ENT referral  Diastasis recti I reviewed images of her CT scans, upper abdomen included I do not think this is a hernia I think it is a diastases recti.  Coughing is exacerbating this.  I appreciate the opportunity to care for this patient. CC: [name], NP Dr. Jerrell Belfast Wyn Quaker, NP Baltazar Apo, MD    Subjective:   Chief Complaint:  Chronic cough, colon cancer screening  HPI [name] is here for follow-up, I had a telehealth visit with her several weeks ago.  She has a chronic cough and throat clearing in the setting of sarcoidosis and a possible mycetoma.  She was on Dexilant 60 mg daily and famotidine 40 mg at bedtime and I added metoclopramide but that made her dizzy so she had to stop it.  She is essentially the same with lots of coughing she has a sensation of a postnasal drip and she coughs up thick mucus.  She does not have heartburn or dysphagia.  The cough does disturb her sleep.  Her primary care provider has given her a Cologuard kit and she intends to do that for colon cancer screening Allergies  Allergen Reactions  . Reglan [Metoclopramide] Other (See Comments)    Dizzy   Current Meds  Medication Sig  . albuterol (VENTOLIN HFA) 108 (90 Base) MCG/ACT inhaler INHALE 1 PUFF INTO THE  LUNGS EVERY 4 HOURS AS  NEEDED FOR WHEEZING OR  SHORTNESS OF BREATH.  Marland Kitchen aspirin 81 MG tablet Take 81 mg by mouth daily.  Marland Kitchen aspirin-acetaminophen-caffeine (EXCEDRIN MIGRAINE) 250-250-65 MG per tablet Take 1 tablet by mouth every 6 (six) hours as needed for pain.  Marland Kitchen atorvastatin (LIPITOR) 20 MG tablet Take 20 mg by mouth daily.  . Cholecalciferol (VITAMIN D-3) 5000 UNITS TABS Take 5,000 Units by mouth daily.  Marland Kitchen dexlansoprazole (DEXILANT) 60 MG  capsule Take 1 capsule (60 mg total) by mouth daily. 3 boxes of samples = 15 caps  . famotidine (PEPCID) 20 MG tablet Take 2 tablets (40 mg total) by mouth at bedtime.  . fenofibrate 160 MG tablet Take 160 mg by mouth daily.  . fish oil-omega-3 fatty acids 1000 MG capsule Take 2 g by mouth daily.  . fluticasone (FLONASE) 50 MCG/ACT nasal spray USE 1 SPRAY INTO BOTH  NOSTRILS DAILY AS NEEDED  . ibuprofen (ADVIL,MOTRIN) 200 MG tablet Take 400 mg by mouth every 6 (six) hours as needed.  . loratadine (CLARITIN) 10 MG tablet Take 1 tablet (10 mg total) by mouth daily.  Marland Kitchen losartan-hydrochlorothiazide  (HYZAAR) 50-12.5 MG per tablet Take 1 tablet by mouth daily.  . Multiple Vitamin (MULTIVITAMIN) capsule Take 1 capsule by mouth daily.    . Naproxen Sod-Diphenhydramine (ALEVE PM PO) Take 1 tablet by mouth at bedtime as needed and may repeat dose one time if needed.  . niacin (NIASPAN) 1000 MG CR tablet Take 1,000 mg by mouth at bedtime.  . SYMBICORT 160-4.5 MCG/ACT inhaler USE 2 PUFFS TWO TIMES DAILY  . [DISCONTINUED] metoCLOPramide (REGLAN) 10 MG tablet Take 1 tablet (10 mg total) by mouth at bedtime.   Past Medical History:  Diagnosis Date  . Chronic sinusitis   . Cough   . Diabetes mellitus without complication ([place])   . Diverticulosis 2009   mild sigmoid  . Hypertension   . Sarcoidosis    Past Surgical History:  Procedure Laterality Date  . COLONOSCOPY    . LYMPHADENECTOMY    . NOSE SURGERY    . TUBAL LIGATION    . VESICOVAGINAL FISTULA CLOSURE W/ TAH    . VIDEO BRONCHOSCOPY Bilateral 07/29/2017   Procedure: VIDEO BRONCHOSCOPY WITH FLUORO;  Surgeon: Collene Gobble, MD;  Location: [facility];  Service: Cardiopulmonary;  Laterality: Bilateral;   Social History   Social History Narrative   Patient lives at home alone.  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   Review of Systems As abobe  Objective:   Physical Exam BP 120/60   Pulse 69   Temp 97.7 F (36.5 C)   Ht _0  (1.651 m)   Wt 160 lb (72.6 kg)   BMI 26.63 kg/m  NAD Thin bw Mouth NL Pharynx slight cobblestoning Lungs cta abd - epigastric bulge diastsis recti vs hernia

## 2019-02-22 NOTE — Assessment & Plan Note (Signed)
Doing Cologuard

## 2019-02-22 NOTE — Assessment & Plan Note (Addendum)
Not clear this is cause of cough - doubt given the level of therapy she is on and persistence of symptoms.  Certainly can have a component of reflux however.  In order to determine this upper endoscopy with Bravo pH probe placement versus 24-hour pH and impedance study would be necessary and I would do on therapy.  She is considering this though I am not strongly recommending it based upon the clinical scenario.

## 2019-03-07 DIAGNOSIS — R0989 Other specified symptoms and signs involving the circulatory and respiratory systems: Secondary | ICD-10-CM | POA: Insufficient documentation

## 2019-03-07 DIAGNOSIS — J3489 Other specified disorders of nose and nasal sinuses: Secondary | ICD-10-CM | POA: Insufficient documentation

## 2019-04-06 ENCOUNTER — Telehealth: Payer: Self-pay | Admitting: Pulmonary Disease

## 2019-04-06 ENCOUNTER — Encounter: Payer: Self-pay | Admitting: Pulmonary Disease

## 2019-04-06 DIAGNOSIS — J849 Interstitial pulmonary disease, unspecified: Secondary | ICD-10-CM

## 2019-04-06 DIAGNOSIS — B479 Mycetoma, unspecified: Secondary | ICD-10-CM

## 2019-04-06 DIAGNOSIS — R05 Cough: Secondary | ICD-10-CM

## 2019-04-06 DIAGNOSIS — R059 Cough, unspecified: Secondary | ICD-10-CM

## 2019-04-06 DIAGNOSIS — D869 Sarcoidosis, unspecified: Secondary | ICD-10-CM

## 2019-04-06 DIAGNOSIS — R918 Other nonspecific abnormal finding of lung field: Secondary | ICD-10-CM

## 2019-04-06 NOTE — Telephone Encounter (Signed)
Attempted to contact.  Called number provided.  Left voicemail with both my office number as well as cell phone.  Wyn Quaker, FNP

## 2019-04-06 NOTE — Telephone Encounter (Signed)
04/06/2019 1634  Was able to reach patient's primary care provider [name], NP.  Patient was seen in primary care office today with worsened cough with clear sputum.  Patient's cough has worsened over the past couple weeks.  Although suspected to be GERD patient has been evaluated by gastroenterology and does not feel that GERD is the driving factor of patient's cough.  Patient's last CT scan:  09/13/2018-CT chest without contrast- in addition to evolving changes of sarcoid chronic left upper lobe cavity with enlarging internal mycetoma there is progressively worsened areas of fibrosis which are basilar predominant and lungs bilaterally, suspicious for UIP    Cherina,  Can we please contact the patient to get her scheduled looks like Dr. Lamonte Sakai has an opening on 04/18/2019 in the afternoon.   Please also make sure patient is contacted ENT Dr. [name] who she saw on July/2020 regarding her acute symptoms.  I spoke with Dr. Lamonte Sakai regarding the patient's case and we would like to repeat CT imaging.  Patient needs to complete a high-resolution CT chest as well as a repeat CT of her sinuses.  This will further evaluate the interstitial lung disease, sarcoidosis, mycetoma as well as worsened cough.  Patient also needs to be scheduled for pulmonary function testing.  This was initially planned for patient to follow-up with Dr. Lamonte Sakai and complete this in April/2020.  Can you please route this message to [facility] for follow-up for scheduling with pulmonary function testing.  I have already placed the order for this imaging.  I have also discussed this case with Dr. Lamonte Sakai he agrees.  Wyn Quaker FNP

## 2019-04-06 NOTE — Telephone Encounter (Signed)
Pt's PCP would like to speak to Aaron Edelman in regards to the pt. Will route message to [place].

## 2019-04-06 NOTE — Telephone Encounter (Signed)
Spoke with patient. She has agreed to come in to see RB on 8/25 at 345. Verbalized understanding.   Patrice, can you please help with the PFT? Thanks!

## 2019-04-07 ENCOUNTER — Ambulatory Visit (INDEPENDENT_AMBULATORY_CARE_PROVIDER_SITE_OTHER)
Admission: RE | Admit: 2019-04-07 | Discharge: 2019-04-07 | Disposition: A | Discharge status: Home / Self Care | Payer: 59 | Source: Ambulatory Visit | Attending: Pulmonary Disease | Admitting: Pulmonary Disease

## 2019-04-07 ENCOUNTER — Other Ambulatory Visit: Payer: Self-pay

## 2019-04-07 DIAGNOSIS — D869 Sarcoidosis, unspecified: Secondary | ICD-10-CM | POA: Diagnosis not present

## 2019-04-07 DIAGNOSIS — R05 Cough: Secondary | ICD-10-CM

## 2019-04-07 DIAGNOSIS — R059 Cough, unspecified: Secondary | ICD-10-CM

## 2019-04-07 DIAGNOSIS — J849 Interstitial pulmonary disease, unspecified: Secondary | ICD-10-CM

## 2019-04-07 DIAGNOSIS — R918 Other nonspecific abnormal finding of lung field: Secondary | ICD-10-CM

## 2019-04-07 NOTE — Telephone Encounter (Signed)
Will await response from Patrice 

## 2019-04-10 ENCOUNTER — Other Ambulatory Visit: Payer: 59

## 2019-04-11 ENCOUNTER — Other Ambulatory Visit: Payer: Self-pay | Admitting: *Deleted

## 2019-04-11 DIAGNOSIS — N644 Mastodynia: Secondary | ICD-10-CM

## 2019-04-12 NOTE — Telephone Encounter (Signed)
Looked into patient's chart. Looks like she was scheduled for a PFT on 01/24/19, but patient canceled due to feeling anxious about the test.  I called patient to make sure she was ready to have test. Patient states that no one has told her it is an immediate need. She is still slightly anxious about doing the test. She would like to discuss with Dr. Lamonte Sakai at her visit on 04/18/19 and scheduled after.   I will send this to Aaron Edelman, Dr. Lamonte Sakai, and Sharl Ma as Juluis Rainier. Closing message

## 2019-04-12 NOTE — Telephone Encounter (Signed)
This is the patients choice.   Our recommendations stand that it would make more sense to have it completed prior to upcoming ov.   Keep scheduled appointment with Dr. Lamonte Sakai as is.  We will also review CT chest at that time as well as CT of sinuses.  Wyn Quaker, FNP

## 2019-04-14 NOTE — Telephone Encounter (Signed)
No pft's available on 04/18/2019 - Called respiratory therapy and left message to see if they have availability - waiting on return call -pr

## 2019-04-18 ENCOUNTER — Encounter: Payer: Self-pay | Admitting: Emergency Medicine

## 2019-04-18 ENCOUNTER — Other Ambulatory Visit: Payer: Self-pay

## 2019-04-18 ENCOUNTER — Ambulatory Visit (INDEPENDENT_AMBULATORY_CARE_PROVIDER_SITE_OTHER): Payer: 59 | Admitting: Emergency Medicine

## 2019-04-18 VITALS — BP 130/84 | HR 88 | Temp 98.4°F | Ht 65.0 in | Wt 159.0 lb

## 2019-04-18 DIAGNOSIS — R05 Cough: Secondary | ICD-10-CM | POA: Diagnosis not present

## 2019-04-18 DIAGNOSIS — J849 Interstitial pulmonary disease, unspecified: Secondary | ICD-10-CM | POA: Diagnosis not present

## 2019-04-18 DIAGNOSIS — D869 Sarcoidosis, unspecified: Secondary | ICD-10-CM

## 2019-04-18 DIAGNOSIS — R053 Chronic cough: Secondary | ICD-10-CM

## 2019-04-18 DIAGNOSIS — I25119 Atherosclerotic heart disease of native coronary artery with unspecified angina pectoris: Secondary | ICD-10-CM | POA: Diagnosis not present

## 2019-04-18 DIAGNOSIS — R059 Cough, unspecified: Secondary | ICD-10-CM

## 2019-04-18 NOTE — Assessment & Plan Note (Signed)
CT chest reviewed with her.  She may ultimately benefit from retrying anti-inflammatory therapy or possibly even OFEV going forward.  Not on daily steroids or immune modulation. Continue Symbicort, albuterol as needed.

## 2019-04-18 NOTE — Progress Notes (Signed)
HPI:   [name] 09/29/18 --63 year old [name] with a history of pulmonary sarcoidosis with parenchymal distortion and scarring, left upper lobe cavity with a presumed mycetoma, history of Aspergillus on fungal cultures.  Her IgE last time was 49, eosinophil percentage elevated at 7.6%, absolute 0.5, ACE level 52.  She continued to have cough and we Treated her with clindamycin for possible sinusitis given her history of frequent chronic sinus disease.  We performed a CT chest on 1/21 that I reviewed, showed progressive areas of fibrotic change that are base predominant. CT scan of the sinuses from the same day shows chronic postsurgical changes with new bilateral maxillary sinus fluid consistent with acute sinusitis. She completed clinda and feels a lot better but still has brownish nasal discharge that comes out w NSW's.   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  Previously on methotrexate but stopped, difficult to tolerate.  Not currently on daily prednisone or maintenance therapy. She was just treated with a pred taper by her PCP.  Bronchodilator regimen includes Symbicort. She hasn't had repeat PFT yet.   She also has chronic cough with significant chronic sinus disease, rhinitis, GERD.  She is on Dexilant, Pepcid, Flonase, loratadine, [name].  Cough is her biggest problem - has improved some since recent pred taper.   She underwent CT chest 04/07/2019 which I reviewed, shows very similar interstitial changes and scarring compared with 05/15/2019.  She has a left upper lobe thick-walled cavity with internal soft tissue, presumed mycetoma.  Extensive bibasilar honeycomb change CT sinuses 8/14 reviewed, did not show any evidence of acute or chronic sinusitis. She had a reassuring ENT visit with Dr [name]   EXAM :  Vitals:   04/18/19 1603  BP: 130/84  Pulse: 88  Temp: 98.4 F (36.9 C)  TempSrc: Oral  SpO2: 99%  Weight: 159 lb (72.1 kg)  Height: 5\' 5"  (1.651 m)   Gen: Pleasant, well-nourished, in no distress,  normal affect  ENT: No lesions,  mouth clear,  oropharynx clear, no postnasal drip, some hoarse voice today  Neck: No JVD, no stridor  Lungs: No use of accessory muscles, bilateral inspiratory crackles best heard at the bases  Cardiovascular: RRR, heart sounds normal, no murmur or gallops, no peripheral edema  Musculoskeletal: No deformities, no cyanosis or clubbing  Neuro: alert, non focal  Skin: Warm, no lesions or rash   Chronic cough Multifactorial.  Again based on description it sounds like principally upper airway irritation although certainly she is at risk for instability of her asthma syndrome.  She has a mycetoma with known Aspergillus, consider ABPA although her IgE has never been high enough to make that diagnosis.  I will recheck the IgE and Aspergillus panel today (Post steroids).  She is aggressively treated for GERD and appears to be stable with regard to this.  She does still have clear drainage.  Her CT sinuses was clear and she has seen Dr. [name], no evidence of chronic or acute sinusitis.  We are trying to be aggressive with her allergy control but with breakthrough symptoms I think it would be reasonable for her to see an allergist to consider going back on immunotherapy.  We will make the referral today  Sarcoidosis CT chest reviewed with her.  She may ultimately benefit from retrying anti-inflammatory therapy or possibly even OFEV going forward.  Not on daily steroids  or immune modulation. Continue Symbicort, albuterol as needed.   Baltazar Apo, MD, PhD 04/18/2019, 4:49 PM [facility] [phone] or if no answer [phone]

## 2019-04-18 NOTE — Assessment & Plan Note (Signed)
Multifactorial.  Again based on description it sounds like principally upper airway irritation although certainly she is at risk for instability of her asthma syndrome.  She has a mycetoma with known Aspergillus, consider ABPA although her IgE has never been high enough to make that diagnosis.  I will recheck the IgE and Aspergillus panel today (Post steroids).  She is aggressively treated for GERD and appears to be stable with regard to this.  She does still have clear drainage.  Her CT sinuses was clear and she has seen Dr. [name], no evidence of chronic or acute sinusitis.  We are trying to be aggressive with her allergy control but with breakthrough symptoms I think it would be reasonable for her to see an allergist to consider going back on immunotherapy.  We will make the referral today

## 2019-04-18 NOTE — Patient Instructions (Signed)
Complete your prednisone taper as planned. Please continue your Dexilant, Pepcid, fluticasone nasal spray, loratadine, nasal saline washes as you have been using them. Blood work today: IgE, Aspergillus panel, CBC with differential We will refer you to see an allergist to consider skin testing, possible immunotherapy We will refer you to cardiology to evaluate your risk for possible coronary artery disease. Follow with Dr. Lamonte Sakai in 2 months or sooner if you have any problems.

## 2019-04-19 LAB — CBC WITH DIFFERENTIAL/PLATELET
Basophils Absolute: 0.1 10*3/uL (ref 0.0–0.1)
Basophils Relative: 0.6 % (ref 0.0–3.0)
Eosinophils Absolute: 0 10*3/uL (ref 0.0–0.7)
Eosinophils Relative: 0.2 % (ref 0.0–5.0)
HCT: 38.9 % (ref 36.0–46.0)
Hemoglobin: 12.7 g/dL (ref 12.0–15.0)
Lymphocytes Relative: 17.7 % (ref 12.0–46.0)
Lymphs Abs: 2 10*3/uL (ref 0.7–4.0)
MCHC: 32.6 g/dL (ref 30.0–36.0)
MCV: 86.2 fl (ref 78.0–100.0)
Monocytes Absolute: 0.5 10*3/uL (ref 0.1–1.0)
Monocytes Relative: 4.4 % (ref 3.0–12.0)
Neutro Abs: 8.8 10*3/uL — ABNORMAL HIGH (ref 1.4–7.7)
Neutrophils Relative %: 77.1 % — ABNORMAL HIGH (ref 43.0–77.0)
Platelets: 255 10*3/uL (ref 150.0–400.0)
RBC: 4.52 Mil/uL (ref 3.87–5.11)
RDW: 14.2 % (ref 11.5–15.5)
WBC: 11.4 10*3/uL — ABNORMAL HIGH (ref 4.0–10.5)

## 2019-04-19 LAB — IGE: IgE (Immunoglobulin E), Serum: 66 kU/L (ref <=114)

## 2019-04-19 NOTE — Progress Notes (Signed)
Reviewed with pt on 04/18/2019 ov with Dr. Lamonte Sakai.   Nothing further needed.   Wyn Quaker FNP

## 2019-04-19 NOTE — Telephone Encounter (Signed)
Called and spoke to patient - per Dr. Lamonte Sakai - pft not needed at this time due to treatment plan will not change based on result of pft -pr

## 2019-04-25 LAB — ASPERGILLUS IGE PANEL
A. Amstel/Glaucu Class Interp: 0
A. Flavus Class Interp: 0
A. Nidulans Class Interp: 0
A. Niger Class Interp: 0
A. Terreus Class Interp: 0
A. Versicolor Class Interp: 0
Aspergillus fumigatus IgE: 0.25 kU/L (ref <0.35)

## 2019-04-28 ENCOUNTER — Other Ambulatory Visit: Payer: Self-pay

## 2019-04-28 ENCOUNTER — Ambulatory Visit
Admission: RE | Admit: 2019-04-28 | Discharge: 2019-04-28 | Disposition: A | Discharge status: Home / Self Care | Payer: 59 | Source: Ambulatory Visit | Attending: *Deleted | Admitting: *Deleted

## 2019-04-28 ENCOUNTER — Ambulatory Visit: Payer: 59

## 2019-04-28 DIAGNOSIS — N644 Mastodynia: Secondary | ICD-10-CM

## 2019-05-14 ENCOUNTER — Other Ambulatory Visit: Payer: Self-pay | Admitting: Emergency Medicine

## 2019-05-23 ENCOUNTER — Ambulatory Visit (INDEPENDENT_AMBULATORY_CARE_PROVIDER_SITE_OTHER): Payer: 59 | Admitting: Allergy and Immunology

## 2019-05-23 ENCOUNTER — Other Ambulatory Visit: Payer: Self-pay

## 2019-05-23 ENCOUNTER — Encounter: Payer: Self-pay | Admitting: Allergy and Immunology

## 2019-05-23 VITALS — BP 120/74 | HR 70 | Temp 97.7°F | Resp 18 | Ht 64.0 in | Wt 160.0 lb

## 2019-05-23 DIAGNOSIS — J324 Chronic pansinusitis: Secondary | ICD-10-CM

## 2019-05-23 DIAGNOSIS — J479 Bronchiectasis, uncomplicated: Secondary | ICD-10-CM

## 2019-05-23 DIAGNOSIS — J849 Interstitial pulmonary disease, unspecified: Secondary | ICD-10-CM | POA: Diagnosis not present

## 2019-05-23 DIAGNOSIS — J3089 Other allergic rhinitis: Secondary | ICD-10-CM

## 2019-05-23 DIAGNOSIS — B999 Unspecified infectious disease: Secondary | ICD-10-CM

## 2019-05-23 DIAGNOSIS — D860 Sarcoidosis of lung: Secondary | ICD-10-CM | POA: Diagnosis not present

## 2019-05-23 DIAGNOSIS — K219 Gastro-esophageal reflux disease without esophagitis: Secondary | ICD-10-CM

## 2019-05-23 DIAGNOSIS — J4540 Moderate persistent asthma, uncomplicated: Secondary | ICD-10-CM

## 2019-05-23 MED ORDER — FAMOTIDINE 40 MG PO TABS: 40.0000 mg | ORAL_TABLET | Freq: Every day | ORAL | 5 refills | Status: DC

## 2019-05-23 MED ORDER — DEXILANT 60 MG PO CPDR: 60.0000 mg | DELAYED_RELEASE_CAPSULE | Freq: Two times a day (BID) | ORAL | 5 refills | Status: DC

## 2019-05-23 NOTE — Patient Instructions (Addendum)
  1.  Allergen avoidance measures  2.  Continue to treat inflammation of airway:   A.  Symbicort 160-2 inhalations twice a day.  Use a spacing device  B.  Flonase 2 sprays each nostril once a day following nasal wash  3.  Continue to treat reflux:   A.  Eliminate use of fish oil, caffeine, chocolate  B.  Raise head of bed  C.  No late meals  D.  Increase Dexilant 60 mg twice a day  E.  Famotidine 40 mg in evening  F.  Metoclopramide 10 mg in evening  4.  If needed:   A.  OTC antihistamine  B.  Albuterol HFA-2 inhalations once a day  5.  Blood - IgA/G/M, anti-pneumococcal antibodies, antitetanus antibodies  6.  Plan for fall flu vaccine (and COVID vaccine)  7.  Return to clinic in 4 weeks or earlier if problem

## 2019-05-23 NOTE — Progress Notes (Signed)
[place] [facility] [place] [facility] [place] [facility] [place]   Dear Dr. Lamonte Sakai,  Thank you for referring [name] to the [facility] on 05/23/2019.   Below is a summation of this patient's evaluation and recommendations.  Thank you for your referral. I will keep you informed about this patient's response to treatment.   If you have any questions please do not hesitate to contact me.   Sincerely,  Jiles Prows, MD Allergy / Immunology [place]   ______________________________________________________________________    NEW PATIENT NOTE  Referring Provider: Collene Gobble, MD Primary Provider: [name], NP Date of office visit: 05/23/2019    Subjective:   Chief Complaint:  [name] (DOB: November 21, 1955) is a 63 y.o. female who presents to the clinic on 05/23/2019 with a chief complaint of Cough .     HPI: [name] presents to this clinic in evaluation of cough.  She has a very long and complex respiratory history having received a diagnosis of sarcoid with secondary mycetoma and asthma and chronic sinusitis requiring 2 sinus surgeries and laryngopharyngeal reflux.  Even in the face of utilizing a large collection of medical treatment directed against these etiologic factors she still continues to have cough.  Her cough is of a spasmodic nature associated with a "tons" of throat clearing and "feeling like something is living in the back of my throat" with hoarseness and posttussive emesis on a common basis.  During her evaluation today she developed one of her coughing episodes and she definitely had unrelenting coughing associated with what appeared to be some degree of laryngeal spasm and inability to talk and significant hoarseness after the event resolved.  She does not really have a tremendous amount of wheezing or shortness of breath or chest tightness and she  can exercise.  She does not really have any ugly nasal discharge or inability to smell or sneezing.  She does not have any classic reflux symptoms at this point in time.  She performs a nasal wash every morning followed by Flonase and a triple antibiotic ointment application applied to her nose.  She is treating reflux with a combination of her proton pump inhibitor and metoclopramide at bedtime and it was not suggested that she undergo any further evaluation or treatment for LPR based upon an evaluation by her gastroenterologist.  It was suggested by her primary care doctor that she use metoclopramide 4 times a day but she cannot tolerate this medication 4 times per day secondary to sedation.  She has a cup of tea every day and eats chocolate about twice a week.  She continues to use Symbicort on a regular basis and has no need to use a short acting bronchodilator.  When she does use a short acting bronchodilator it does not really help her cough.  Past Medical History:  Diagnosis Date  . Angio-edema   . Chronic sinusitis   . Cough   . Diabetes mellitus without complication ([place])   . Diverticulosis 2009   mild sigmoid  . Hypertension   . Recurrent upper respiratory infection (URI)   . Sarcoidosis     Past Surgical History:  Procedure Laterality Date  . COLONOSCOPY    . LYMPHADENECTOMY    . NOSE SURGERY    . SINOSCOPY    . TUBAL LIGATION    . VESICOVAGINAL FISTULA CLOSURE W/ TAH    . VIDEO BRONCHOSCOPY  Bilateral 07/29/2017   Procedure: VIDEO BRONCHOSCOPY WITH FLUORO;  Surgeon: Collene Gobble, MD;  Location: [facility];  Service: Cardiopulmonary;  Laterality: Bilateral;    Allergies as of 05/23/2019   No Known Allergies     Medication List    albuterol 108 (90 Base) MCG/ACT inhaler Commonly known as: VENTOLIN HFA INHALE 1 PUFF INTO THE  LUNGS EVERY 4 HOURS AS  NEEDED FOR WHEEZING OR  SHORTNESS OF BREATH.   ALEVE PM PO Take 1 tablet by mouth at bedtime as needed and may repeat  dose one time if needed.   aspirin 81 MG tablet Take 81 mg by mouth daily.   aspirin-acetaminophen-caffeine 250-250-65 MG tablet Commonly known as: EXCEDRIN MIGRAINE Take 1 tablet by mouth every 6 (six) hours as needed for pain.   atorvastatin 20 MG tablet Commonly known as: LIPITOR Take 20 mg by mouth daily.   dexlansoprazole 60 MG capsule Commonly known as: Dexilant Take 1 capsule (60 mg total) by mouth daily. 3 boxes of samples = 15 caps   fenofibrate 160 MG tablet Take 160 mg by mouth daily.   fish oil-omega-3 fatty acids 1000 MG capsule Take 2 g by mouth daily.   fluticasone 50 MCG/ACT nasal spray Commonly known as: FLONASE USE 1 SPRAY INTO BOTH  NOSTRILS DAILY AS NEEDED   ibuprofen 200 MG tablet Commonly known as: ADVIL Take 400 mg by mouth every 6 (six) hours as needed.   loratadine 10 MG tablet Commonly known as: CLARITIN Take 1 tablet (10 mg total) by mouth daily.   losartan-hydrochlorothiazide 50-12.5 MG tablet Commonly known as: HYZAAR Take 1 tablet by mouth daily.   metoCLOPramide 10 MG tablet Commonly known as: REGLAN Take 10 mg by mouth 4 (four) times daily.   multivitamin capsule Take 1 capsule by mouth daily.   niacin 1000 MG CR tablet Commonly known as: NIASPAN Take 1,000 mg by mouth at bedtime.   Symbicort 160-4.5 MCG/ACT inhaler Generic drug: budesonide-formoterol USE 2 PUFFS TWO TIMES DAILY   Vitamin D-3 125 MCG (5000 UT) Tabs Take 5,000 Units by mouth daily.       Review of systems negative except as noted in HPI / PMHx or noted below:  Review of Systems  Constitutional: Negative.   HENT: Negative.   Eyes: Negative.   Respiratory: Negative.   Cardiovascular: Negative.   Gastrointestinal: Negative.   Genitourinary: Negative.   Musculoskeletal: Negative.   Skin: Negative.   Neurological: Negative.   Endo/Heme/Allergies: Negative.   Psychiatric/Behavioral: Negative.     Family History  Problem Relation Age of Onset  .  Stroke Father   . Heart murmur Father   . Diabetes Father   . Emphysema Mother   . COPD Mother   . Breast cancer Sister   . Colon cancer Neg Hx   . Esophageal cancer Neg Hx   . Rectal cancer Neg Hx   . Urticaria Neg Hx   . Eczema Neg Hx   . Immunodeficiency Neg Hx   . Atopy Neg Hx   . Asthma Neg Hx   . Angioedema Neg Hx   . Allergic rhinitis Neg Hx     Social History   Socioeconomic History  . Marital status: Divorced    Spouse name: Not on file  . Number of children: 2  . Years of education: college  . Highest education level: Not on file  Occupational History  . Occupation: Surveyor, mining: [employer]  . Financial  resource strain: Not on file  . Food insecurity    Worry: Not on file    Inability: Not on file  . Transportation needs    Medical: Not on file    Non-medical: Not on file  Tobacco Use  . Smoking status: Never Smoker  . Smokeless tobacco: Never Used  Substance and Sexual Activity  . Alcohol use: No  . Drug use: No  . Sexual activity: Not Currently    Partners: Male  Lifestyle  . Physical activity    Days per week: Not on file    Minutes per session: Not on file  . Stress: Not on file  Relationships  . Social Herbalist on phone: Not on file    Gets together: Not on file    Attends religious service: Not on file    Active member of club or organization: Not on file    Attends meetings of clubs or organizations: Not on file    Relationship status: Not on file  . Intimate partner violence    Fear of current or ex partner: Not on file    Emotionally abused: Not on file    Physically abused: Not on file    Forced sexual activity: Not on file  Other Topics Concern  . Not on file  Social History Narrative   Patient lives at home alone.  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   Objective:   Vitals:   05/23/19 1425  BP: 120/74  Pulse: 70  Resp: 18  Temp: 97.7 F (36.5 C)  SpO2: 97%   Height: 5\' 4"  (162.6 cm) Weight: 160 lb (72.6 kg)  Physical Exam Constitutional:      Appearance: She is not diaphoretic.     Comments: Raspy voice, throat clearing, cough  HENT:     Head: Normocephalic. No right periorbital erythema or left periorbital erythema.     Right Ear: Tympanic membrane, ear canal and external ear normal.     Left Ear: Tympanic membrane, ear canal and external ear normal.     Nose: Nose normal. No rhinorrhea. Mucosal edema: Septal perforation.     Mouth/Throat:     Pharynx: No oropharyngeal exudate.  Eyes:     General: Lids are normal.     Conjunctiva/sclera: Conjunctivae normal.     Pupils: Pupils are equal, round, and reactive to light.  Neck:     Thyroid: No thyromegaly.     Trachea: Trachea normal. No tracheal deviation.  Cardiovascular:     Rate and Rhythm: Normal rate and regular rhythm.     Heart sounds: Normal heart sounds, S1 normal and S2 normal. No murmur.  Pulmonary:     Effort: Pulmonary effort is normal. No respiratory distress.     Breath sounds: No stridor. No wheezing or rales.  Chest:     Chest wall: No tenderness.  Abdominal:     General: There is no distension.     Palpations: Abdomen is soft. There is  no mass.     Tenderness: There is no abdominal tenderness. There is no guarding or rebound.  Musculoskeletal:        General: No tenderness.  Lymphadenopathy:     Head:     Right side of head: No tonsillar adenopathy.     Left side of head: No tonsillar adenopathy.     Cervical: No cervical adenopathy.  Skin:     Coloration: Skin is not pale.     Findings: No erythema or rash.     Nails: There is no clubbing.   Neurological:     Mental Status: She is alert.     Diagnostics: Allergy skin tests were performed.  She demonstrated hypersensitivity to house dust mite.  Results of a rhinoscopy performed by Dr. [name] dated 06 April 2019 identifies the following:  Flexible laryngoscopy shows patent anterior nasal cavity with minimal crusting, no discharge or infection. The patient has a septal perforation from previous surgery and widely patent sinonasal passageways. There is no evidence of purulent discharge or significant crusting, no acute or chronic sinusitis or sinonasal polyps. Normal base of tongue and supraglottis Normal vocal cord mobility without vocal cord nodule, mass, polyp or tumor. Patient has post glottic erythema which may be consistent with reflux. Hypopharynx normal without mass, pooling of secretions or aspiration.  Results of pulmonary function tests obtained 13 Jan 2017 identified TLC 70% predicted, RV 66% of predicted, DLCO/VA 84% predicted.  Results of a chest CT scan obtained 07 April 2019 identified the following:  Mediastinum/Nodes: Multiple borderline enlarged and enlarged mediastinal and bilateral hilar lymph nodes, many of which have some internal calcifications, similar to the prior examination, compatible with reported clinical history of sarcoidosis. Esophagus is unremarkable in appearance. Enlarged left axillary lymph node measuring up to 1.7 cm in short axis, similar to the prior study. No right axillary lymphadenopathy.  Lungs/Pleura: Widespread areas of cylindrical, varicose and cystic bronchiectasis are again noted in the lungs bilaterally. Fibrocavitary changes are noted in the upper lobes of the lungs, including a large cavity in the left upper lobe medially near the apex where there is some internal soft tissue (axial image 24 of series 3), similar to  the prior study, suspicious for internal aspergilloma. Multiple other small pulmonary nodules scattered throughout the lungs bilaterally, most of which are subpleural in location or peribronchovascular (i.e., the overall distribution is perilymphatic). Extensive honeycombing in the lung bases bilaterally, unchanged. No pleural effusions.  Results of a sinus CT scan obtained 07 April 2019 identified the following: Findings of prior sinus surgery. No paranasal sinus mucosal thickening.  Results of blood tests obtained 18 April 2019 identified WBC 11.4, absolute eosinophils 0, absolute lymphocyte 2000, hemoglobin 12.7, platelet 255, serum IgE 66 KU/L, no IgE antibodies directed against a screening panel of Aspergillus species   Results of blood tests obtained 29 August 2018 identified WBC 6.4, absolute eosinophil 500, absolute lymphocyte 1300, hemoglobin 12.7, platelet 228.   Assessment and Plan:    1. Asthma, moderate persistent, well-controlled   2. Sarcoidosis of lung ([place])   3. Interstitial lung disease ([place])   4. Bronchiectasis without complication ([place])   5. Recurrent infections   6. Chronic pansinusitis   7. Perennial allergic rhinitis   8. LPRD (laryngopharyngeal reflux disease)     1.  Allergen avoidance measures  2.  Continue to treat inflammation of airway:   A.  Symbicort 160-2 inhalations twice a day.  Use a spacing device  B.  Flonase 2  sprays each nostril once a day following nasal wash  3.  Continue to treat reflux:   A.  Eliminate use of fish oil, caffeine, chocolate  B.  Raise head of bed  C.  No late meals  D.  Increase Dexilant 60 mg twice a day  E.  Famotidine 40 mg in evening  F.  Metoclopramide 10 mg in evening  4.  If needed:   A.  OTC antihistamine  B.  Albuterol HFA-2 inhalations once a day  5.  Blood - IgA/G/M, anti-pneumococcal antibodies, antitetanus antibodies  6.  Plan for fall flu vaccine (and COVID vaccine)  7.  Return to clinic in 4  weeks or earlier if problem  Lexi appears to have a collection of insults directed at her respiratory track giving rise to her persistent respiratory tract symptoms.  She does appear to have atopic disease and we will get her to perform allergen avoidance measures while she continues on anti-inflammatory agents for her airway, appears to have had, or continues to have, significant inflammation of her lung based upon her sarcoidosis with a change in the architecture of her lung giving rise to bronchiectasis and fibrosis, and also appears to have significant laryngeal irritation most likely from her reflux disease for which we will aggressively treat with therapy noted above.  She has a history of recurrent infections and will obtain the blood test noted above in investigation of that issue.  I will regroup with her in 4 weeks or earlier if there is a problem.  Jiles Prows, MD Allergy / Immunology [place] [facility]

## 2019-05-24 ENCOUNTER — Encounter: Payer: Self-pay | Admitting: Allergy and Immunology

## 2019-05-29 LAB — IGG, IGA, IGM
IgA/Immunoglobulin A, Serum: 191 mg/dL (ref 87–352)
IgG (Immunoglobin G), Serum: 1402 mg/dL (ref 586–1602)
IgM (Immunoglobulin M), Srm: 30 mg/dL (ref 26–217)

## 2019-05-29 LAB — STREP PNEUMONIAE 23 SEROTYPES IGG
Pneumo Ab Type 1*: 4.2 ug/mL (ref >1.3)
Pneumo Ab Type 12 (12F)*: 0.5 ug/mL — ABNORMAL LOW (ref >1.3)
Pneumo Ab Type 14*: >29.1 ug/mL (ref >1.3)
Pneumo Ab Type 17 (17F)*: 3.8 ug/mL (ref >1.3)
Pneumo Ab Type 19 (19F)*: 10.1 ug/mL (ref >1.3)
Pneumo Ab Type 2*: 0.6 ug/mL — ABNORMAL LOW (ref >1.3)
Pneumo Ab Type 20*: 7.2 ug/mL (ref >1.3)
Pneumo Ab Type 22 (22F)*: 0.9 ug/mL — ABNORMAL LOW (ref >1.3)
Pneumo Ab Type 23 (23F)*: 3.7 ug/mL (ref >1.3)
Pneumo Ab Type 26 (6B)*: 3.1 ug/mL (ref >1.3)
Pneumo Ab Type 3*: 9.5 ug/mL (ref >1.3)
Pneumo Ab Type 34 (10A)*: 1.6 ug/mL (ref >1.3)
Pneumo Ab Type 4*: 2.5 ug/mL (ref >1.3)
Pneumo Ab Type 43 (11A)*: 0.3 ug/mL — ABNORMAL LOW (ref >1.3)
Pneumo Ab Type 5*: 17.2 ug/mL (ref >1.3)
Pneumo Ab Type 51 (7F)*: 7.2 ug/mL (ref >1.3)
Pneumo Ab Type 54 (15B)*: 6.8 ug/mL (ref >1.3)
Pneumo Ab Type 56 (18C)*: >16.7 ug/mL (ref >1.3)
Pneumo Ab Type 57 (19A)*: 9.9 ug/mL (ref >1.3)
Pneumo Ab Type 68 (9V)*: 1 ug/mL — ABNORMAL LOW (ref >1.3)
Pneumo Ab Type 70 (33F)*: 1.2 ug/mL — ABNORMAL LOW (ref >1.3)
Pneumo Ab Type 8*: 0.7 ug/mL — ABNORMAL LOW (ref >1.3)
Pneumo Ab Type 9 (9N)*: 1 ug/mL — ABNORMAL LOW (ref >1.3)

## 2019-05-29 LAB — DIPHTHERIA / TETANUS ANTIBODY PANEL
Diphtheria Ab: <0.1 IU/mL — ABNORMAL LOW (ref <0.10)
Tetanus Ab, IgG: 0.59 IU/mL (ref <0.10)

## 2019-06-20 ENCOUNTER — Other Ambulatory Visit: Payer: Self-pay | Admitting: Emergency Medicine

## 2019-06-20 ENCOUNTER — Encounter: Payer: Self-pay | Admitting: Allergy and Immunology

## 2019-06-20 ENCOUNTER — Other Ambulatory Visit: Payer: Self-pay

## 2019-06-20 ENCOUNTER — Ambulatory Visit (INDEPENDENT_AMBULATORY_CARE_PROVIDER_SITE_OTHER): Payer: 59 | Admitting: Allergy and Immunology

## 2019-06-20 VITALS — BP 130/82 | HR 70 | Temp 97.8°F | Resp 18

## 2019-06-20 DIAGNOSIS — D860 Sarcoidosis of lung: Secondary | ICD-10-CM | POA: Diagnosis not present

## 2019-06-20 DIAGNOSIS — J849 Interstitial pulmonary disease, unspecified: Secondary | ICD-10-CM

## 2019-06-20 DIAGNOSIS — K219 Gastro-esophageal reflux disease without esophagitis: Secondary | ICD-10-CM

## 2019-06-20 DIAGNOSIS — J4540 Moderate persistent asthma, uncomplicated: Secondary | ICD-10-CM | POA: Diagnosis not present

## 2019-06-20 DIAGNOSIS — J3089 Other allergic rhinitis: Secondary | ICD-10-CM

## 2019-06-20 MED ORDER — DEXILANT 60 MG PO CPDR: 60.0000 mg | DELAYED_RELEASE_CAPSULE | Freq: Two times a day (BID) | ORAL | 1 refills | Status: DC

## 2019-06-20 NOTE — Patient Instructions (Addendum)
  1.  Allergen avoidance measures - house dust mite  2.  Continue to treat inflammation of airway:   A.  Symbicort 160-2 inhalations twice a day.  Use a spacing device  B.  Flonase 2 sprays each nostril once a day following nasal wash  3.  Continue to treat reflux:   A.  Eliminate use of fish oil, caffeine, chocolate  B.  Raise head of bed  C.  No late meals  D.  Dexilant 60 mg twice a day  E.  Famotidine 40 mg in evening  F.  Metoclopramide 10 mg in evening  4.  If needed:   A.  OTC antihistamine  B.  Albuterol HFA-2 inhalations once a day  5.  Return to clinic in 8 weeks or earlier if problem  6.  Obtain flu vaccine, DTaP booster, and COVID vaccine when available

## 2019-06-20 NOTE — Progress Notes (Signed)
[place]   Follow-up Note  Referring Provider: [name], NP Primary Provider: [name], NP Date of Office Visit: 06/20/2019  Subjective:   [name] (DOB: [birth year]) is a 63 y.o. female who returns to the [facility] on 06/20/2019 in re-evaluation of the following:  HPI: Corean returns to this clinic in reevaluation of unrelenting coughing episodes believed secondary to a component of LPR and upper airway inflammation and possibly a component of asthma.  Her last visit to this clinic was 23 May 2019 at which point in time we assessed each of these issues.  Utilizing a plan of anti-inflammatory agents for her airway and aggressive therapy directed against reflux she has had some improvement.  She has only had 2 episodes of posttussive emesis since her last visit which is down from a frequency of at least 3 times per week.  She still has the sensation of something hung up in the back of her throat along with throat clearing and still has a throat clearing like cough but it does not appear as though her episodes of laryngeal spasm have been as common as they were prior to starting this plan.  She has eliminated caffeine consumption and for the most part has eliminated chocolate consumption and has eliminated fish oil use..  Allergies as of 06/20/2019   No Known Allergies     Medication List    albuterol 108 (90 Base) MCG/ACT inhaler Commonly known as: VENTOLIN HFA INHALE 1 PUFF INTO THE  LUNGS EVERY 4 HOURS AS  NEEDED FOR WHEEZING OR  SHORTNESS OF BREATH.   ALEVE PM PO Take 1 tablet by mouth at bedtime as needed and may repeat dose one time if needed.   aspirin 81 MG tablet Take 81 mg by mouth daily.   aspirin-acetaminophen-caffeine 250-250-65 MG tablet Commonly known as: EXCEDRIN MIGRAINE Take 1 tablet by mouth every 6 (six) hours as needed for pain.   atorvastatin 20 MG tablet  Commonly known as: LIPITOR Take 20 mg by mouth daily.   Dexilant 60 MG capsule Generic drug: dexlansoprazole Take 1 capsule (60 mg total) by mouth 2 (two) times daily.   famotidine 40 MG tablet Commonly known as: PEPCID Take 1 tablet (40 mg total) by mouth at bedtime.   fenofibrate 160 MG tablet Take 160 mg by mouth daily.   fluticasone 50 MCG/ACT nasal spray Commonly known as: FLONASE USE 1 SPRAY INTO BOTH  NOSTRILS DAILY AS NEEDED   ibuprofen 200 MG tablet Commonly known as: ADVIL Take 400 mg by mouth every 6 (six) hours as needed.   loratadine 10 MG tablet Commonly known as: CLARITIN Take 1 tablet (10 mg total) by mouth daily.   losartan-hydrochlorothiazide 50-12.5 MG tablet Commonly known as: HYZAAR Take 1 tablet by mouth daily.   metoCLOPramide 10 MG tablet Commonly known as: REGLAN Take 10 mg by mouth 4 (four) times daily.   multivitamin capsule Take 1 capsule by mouth daily.   niacin 1000 MG CR tablet Commonly known as: NIASPAN Take 1,000 mg by mouth at bedtime.   Symbicort 160-4.5 MCG/ACT inhaler Generic drug: budesonide-formoterol USE 2 PUFFS TWO TIMES DAILY   Vitamin D-3 125 MCG (5000 UT) Tabs Take 5,000 Units by mouth daily.       Past Medical History:  Diagnosis Date  . Angio-edema   . Chronic sinusitis   . Cough   . Diabetes mellitus without complication ([place])   .  Diverticulosis 2009   mild sigmoid  . Hypertension   . Recurrent upper respiratory infection (URI)   . Sarcoidosis     Past Surgical History:  Procedure Laterality Date  . COLONOSCOPY    . LYMPHADENECTOMY    . NOSE SURGERY    . SINOSCOPY    . TUBAL LIGATION    . VESICOVAGINAL FISTULA CLOSURE W/ TAH    . VIDEO BRONCHOSCOPY Bilateral 07/29/2017   Procedure: VIDEO BRONCHOSCOPY WITH FLUORO;  Surgeon: Collene Gobble, MD;  Location: [facility];  Service: Cardiopulmonary;  Laterality: Bilateral;    Review of systems negative except as noted in HPI / PMHx or noted below:   Review of Systems  Constitutional: Negative.   HENT: Negative.   Eyes: Negative.   Respiratory: Negative.   Cardiovascular: Negative.   Gastrointestinal: Negative.   Genitourinary: Negative.   Musculoskeletal: Negative.   Skin: Negative.   Neurological: Negative.   Endo/Heme/Allergies: Negative.   Psychiatric/Behavioral: Negative.      Objective:   Vitals:   06/20/19 1556  BP: 130/82  Pulse: 70  Resp: 18  Temp: 97.8 F (36.6 C)  SpO2: 98%          Physical Exam Constitutional:      Appearance: She is not diaphoretic.  HENT:     Head: Normocephalic.     Right Ear: Tympanic membrane, ear canal and external ear normal.     Left Ear: Tympanic membrane, ear canal and external ear normal.     Nose: Nose normal. No mucosal edema (Septal perforation with clear borders) or rhinorrhea.     Mouth/Throat:     Pharynx: Uvula midline. No oropharyngeal exudate.  Eyes:     Conjunctiva/sclera: Conjunctivae normal.  Neck:     Thyroid: No thyromegaly.     Trachea: Trachea normal. No tracheal tenderness or tracheal deviation.  Cardiovascular:     Rate and Rhythm: Normal rate and regular rhythm.     Heart sounds: Normal heart sounds, S1 normal and S2 normal. No murmur.  Pulmonary:     Effort: No respiratory distress.     Breath sounds: Normal breath sounds. No stridor. No wheezing or rales.  Lymphadenopathy:     Head:     Right side of head: No tonsillar adenopathy.     Left side of head: No tonsillar adenopathy.     Cervical: No cervical adenopathy.  Skin:    Findings: No erythema or rash.     Nails: There is no clubbing.   Neurological:     Mental Status: She is alert.     Diagnostics:    Spirometry was performed and demonstrated an FEV1 of 1.56 at 75 % of predicted.  The patient had an Asthma Control Test with the following results: ACT Total Score: 17.    Results of blood tests obtained 23 May 2019 identified IgG 1402 NG/DL, IgM 30 mg/DL, IgA 191 mg/DL,  tetanus antitoxoid IgG antibody 0.59U/mL which is protective, diphtheria antibody less than 0.10U/mL which is not protective, and multiple serotypes of pneumococcus with high titer antibodies.  Assessment and Plan:   1. Asthma, moderate persistent, well-controlled   2. Sarcoidosis of lung ([place])   3. Interstitial lung disease ([place])   4. Perennial allergic rhinitis   5. LPRD (laryngopharyngeal reflux disease)     1.  Allergen avoidance measures - house dust mite  2.  Continue to treat inflammation of airway:   A.  Symbicort 160-2 inhalations twice a day.  Use a  spacing device  B.  Flonase 2 sprays each nostril once a day following nasal wash  3.  Continue to treat reflux:   A.  Eliminate use of fish oil, caffeine, chocolate  B.  Raise head of bed  C.  No late meals  D.  Dexilant 60 mg twice a day  E.  Famotidine 40 mg in evening  F.  Metoclopramide 10 mg in evening  4.  If needed:   A.  OTC antihistamine  B.  Albuterol HFA-2 inhalations once a day  5.  Return to clinic in 8 weeks or earlier if problem  6.  Obtain flu vaccine, DTaP booster, and COVID vaccine when available  Rilya is slightly better on her current plan and we will continue to have her use this therapy for a full 12 weeks and see what type of response we get at that time point prior to proceeding with any further evaluation.  Hopefully all of her respiratory tract irritation will resolve with this plan directed against respiratory tract inflammation and reflux.  I will see her back in his clinic in 8 weeks.  Allena Katz, MD Allergy / Immunology [place]

## 2019-06-21 ENCOUNTER — Encounter: Payer: Self-pay | Admitting: Allergy and Immunology

## 2019-06-23 ENCOUNTER — Ambulatory Visit (INDEPENDENT_AMBULATORY_CARE_PROVIDER_SITE_OTHER): Payer: 59 | Admitting: Emergency Medicine

## 2019-06-23 ENCOUNTER — Other Ambulatory Visit: Payer: Self-pay

## 2019-06-23 ENCOUNTER — Encounter: Payer: Self-pay | Admitting: Emergency Medicine

## 2019-06-23 DIAGNOSIS — B479 Mycetoma, unspecified: Secondary | ICD-10-CM

## 2019-06-23 DIAGNOSIS — Z23 Encounter for immunization: Secondary | ICD-10-CM | POA: Diagnosis not present

## 2019-06-23 DIAGNOSIS — R053 Chronic cough: Secondary | ICD-10-CM

## 2019-06-23 DIAGNOSIS — D869 Sarcoidosis, unspecified: Secondary | ICD-10-CM | POA: Diagnosis not present

## 2019-06-23 DIAGNOSIS — R05 Cough: Secondary | ICD-10-CM

## 2019-06-23 NOTE — Assessment & Plan Note (Signed)
There has been some crossover in the past between upper airway irritation syndrome and her sarcoidosis, obstructive lung disease.  At this time it appears that her symptoms are mainly upper airway.  She is working hard with Dr. Neldon Mc to better control contributing factors including GERD and rhinitis.  There was no indication to start immunotherapy.  Agree with Dexilant twice a day, the addition of famotidine and diet modification as recommended by Dr. Neldon Mc Please continue nasal saline rinses and Flonase as you have been taking them.

## 2019-06-23 NOTE — Assessment & Plan Note (Signed)
Has been stable radiographically.  Her IgE and eosinophil counts are both stable.  I do not see and have never been able to establish any evidence for ABPA

## 2019-06-23 NOTE — Patient Instructions (Addendum)
Please continue Symbicort twice a day as you have been taking it.  Rinse and gargle after using.  Keep your albuterol available to use 2 puffs if you needed for short of breath, chest tightness, wheezing. We will not start any maintenance anti-inflammatory medicine for sarcoidosis at this time. We will discuss the timing of your repeat chest imaging at your next visit. Flu shot today. Your pneumonia shot is up-to-date. Agree with Dexilant twice a day, the addition of famotidine and diet modification as recommended by Dr. Neldon Mc Please continue nasal saline rinses and Flonase as you have been taking them. Follow with Dr Lamonte Sakai in 3 months or sooner if you have any problems.

## 2019-06-23 NOTE — Progress Notes (Signed)
HPI:    [name] 04/18/2019 --63 year old woman with complicated history of sarcoidosis with pulmonary parenchymal disease, associated significant scar, left upper lobe cavitary lesion consistent with an aspergilloma based on culture data.  Previously on methotrexate but stopped, difficult to tolerate.  Not currently on daily prednisone or maintenance therapy. She was just treated with a pred taper by her PCP.  Bronchodilator regimen includes Symbicort. She hasn't had repeat PFT yet.   She also has chronic cough with significant chronic sinus disease, rhinitis, GERD.  She is on Dexilant, Pepcid, Flonase, loratadine, [name].  Cough is her biggest problem - has improved some since recent pred taper.   She underwent CT chest 04/07/2019 which I reviewed, shows very similar interstitial changes and scarring compared with 05/15/2019.  She has a left upper lobe thick-walled cavity with internal soft tissue, presumed mycetoma.  Extensive bibasilar honeycomb change CT sinuses 8/14 reviewed, did not show any evidence of acute or chronic sinusitis. She had a reassuring ENT visit with Dr [name]   [name] [id number] --complicated history of sarcoidosis with pulmonary parenchymal disease and scar, left upper lobe cavitary lesion consistent with an aspergilloma (positive culture).  She has been previously on methotrexate, longstanding prednisone both of which were poorly tolerated.  She also has nasal sinus disease and follows with Dr. [name] with ENT.  Cough is one of her persistent symptoms.  We will try to be aggressive with allergy control and I referred her back to allergist for consideration of possible immunotherapy - sensitive only to dust mites.  Last time we checked IgE which was 66, normal eosinophil count. Remains on dexilant, added famotidine; on [name], flonase. Her cough persists. She in Symbicort, uses albuterol about 1x a day, usually for cough and chest tightness.   EXAM :  Vitals:   06/23/19 0941  BP: 124/74   Pulse: 65  SpO2: 99%  Weight: 157 lb (71.2 kg)  Height: 5\' 5"  (1.651 m)   Gen: Pleasant, well-nourished, in no distress,  normal affect  ENT: No lesions,  mouth clear,  oropharynx clear, no postnasal drip, some hoarse voice today  Neck: No JVD, no stridor  Lungs: No use of accessory muscles, bilateral inspiratory crackles best heard at the bases  Cardiovascular: RRR, heart sounds normal, no murmur or gallops, no peripheral edema  Musculoskeletal: No deformities, no cyanosis or clubbing  Neuro: alert, non focal  Skin: Warm, no lesions or rash   Sarcoidosis With significant interstitial changes by CT.  It would appear that her parenchymal disease is overall stable.  Her main symptoms have been cough that leads to wheezing and sometimes dyspnea.  She does have some evidence for obstructive lung disease but this appears to be under control.  No clear indication to retry corticosteroids or other anti-inflammatory regimen at this time.  Please continue Symbicort twice a day as you have been taking it.  Rinse and gargle after using.  Keep your albuterol available to use 2 puffs if you needed for short of breath, chest tightness, wheezing. We will not start any maintenance anti-inflammatory medicine for sarcoidosis at this time. We will discuss the timing of your repeat chest imaging at your next visit. Flu shot today. Your pneumonia shot is up-to-date. Follow with Dr Lamonte Sakai in 3 months or sooner if you have any problems.  Chronic cough There has been some crossover in the past between upper airway irritation syndrome and her sarcoidosis, obstructive lung disease.  At this time it appears that her symptoms are mainly  upper airway.  She is working hard with Dr. Neldon Mc to better control contributing factors including GERD and rhinitis.  There was no indication to start immunotherapy.  Agree with Dexilant twice a day, the addition of famotidine and diet modification as recommended by Dr.  Neldon Mc Please continue nasal saline rinses and Flonase as you have been taking them.  Mycetoma Has been stable radiographically.  Her IgE and eosinophil counts are both stable.  I do not see and have never been able to establish any evidence for ABPA  Baltazar Apo, MD, PhD 06/23/2019, 10:03 AM [facility] [phone] or if no answer [phone]

## 2019-06-23 NOTE — Assessment & Plan Note (Signed)
With significant interstitial changes by CT.  It would appear that her parenchymal disease is overall stable.  Her main symptoms have been cough that leads to wheezing and sometimes dyspnea.  She does have some evidence for obstructive lung disease but this appears to be under control.  No clear indication to retry corticosteroids or other anti-inflammatory regimen at this time.  Please continue Symbicort twice a day as you have been taking it.  Rinse and gargle after using.  Keep your albuterol available to use 2 puffs if you needed for short of breath, chest tightness, wheezing. We will not start any maintenance anti-inflammatory medicine for sarcoidosis at this time. We will discuss the timing of your repeat chest imaging at your next visit. Flu shot today. Your pneumonia shot is up-to-date. Follow with Dr Lamonte Sakai in 3 months or sooner if you have any problems.

## 2019-07-19 ENCOUNTER — Ambulatory Visit (INDEPENDENT_AMBULATORY_CARE_PROVIDER_SITE_OTHER)
Admission: RE | Admit: 2019-07-19 | Discharge: 2019-07-19 | Disposition: A | Discharge status: Home / Self Care | Payer: Self-pay | Source: Ambulatory Visit | Attending: Cardiovascular Disease | Admitting: Cardiovascular Disease

## 2019-07-19 ENCOUNTER — Other Ambulatory Visit: Payer: Self-pay

## 2019-07-19 ENCOUNTER — Ambulatory Visit (INDEPENDENT_AMBULATORY_CARE_PROVIDER_SITE_OTHER): Payer: 59 | Admitting: Cardiovascular Disease

## 2019-07-19 ENCOUNTER — Encounter: Payer: Self-pay | Admitting: Cardiovascular Disease

## 2019-07-19 DIAGNOSIS — R079 Chest pain, unspecified: Secondary | ICD-10-CM | POA: Diagnosis not present

## 2019-07-19 DIAGNOSIS — I2510 Atherosclerotic heart disease of native coronary artery without angina pectoris: Secondary | ICD-10-CM | POA: Insufficient documentation

## 2019-07-19 DIAGNOSIS — E785 Hyperlipidemia, unspecified: Secondary | ICD-10-CM | POA: Insufficient documentation

## 2019-07-19 DIAGNOSIS — I10 Essential (primary) hypertension: Secondary | ICD-10-CM | POA: Diagnosis not present

## 2019-07-19 LAB — HEPATIC FUNCTION PANEL
ALT: 21 IU/L (ref 0–32)
AST: 26 IU/L (ref 0–40)
Albumin: 4.4 g/dL (ref 3.8–4.8)
Alkaline Phosphatase: 101 IU/L (ref 39–117)
Bilirubin Total: 0.4 mg/dL (ref 0.0–1.2)
Bilirubin, Direct: 0.14 mg/dL (ref 0.00–0.40)
Total Protein: 7.2 g/dL (ref 6.0–8.5)

## 2019-07-19 LAB — LIPID PANEL
Chol/HDL Ratio: 2.9 ratio (ref 0.0–4.4)
Cholesterol, Total: 169 mg/dL (ref 100–199)
HDL: 58 mg/dL (ref >39)
LDL Chol Calc (NIH): 98 mg/dL (ref 0–99)
Triglycerides: 65 mg/dL (ref 0–149)
VLDL Cholesterol Cal: 13 mg/dL (ref 5–40)

## 2019-07-19 NOTE — Patient Instructions (Signed)
Medication Instructions:  Your physician recommends that you continue on your current medications as directed. Please refer to the Current Medication list given to you today.  If you need a refill on your cardiac medications before your next appointment, please call your pharmacy.   Lab work: Lipids and Hepatic Function If you have labs (blood work) drawn today and your tests are completely normal, you will receive your results only by: [place] (if you have MyChart) OR A paper copy in the mail If you have any lab test that is abnormal or we need to change your treatment, we will call you to review the results.  Testing/Procedures: Coronary Calcium Score  Follow-Up: At [facility], you and your health needs are our priority.  As part of our continuing mission to provide you with exceptional heart care, we have created designated Provider Care Teams.  These Care Teams include your primary Cardiologist (physician) and Advanced Practice Providers (APPs -  Physician Assistants and Nurse Practitioners) who all work together to provide you with the care you need, when you need it. You may see Ok Anis or one of the following Advanced Practice Providers on your designated Care Team:    Kerin Ransom, PA-C  [place], [place]  Coletta Memos, [place]   Your physician wants you to follow-up in: 6 months. You will receive a reminder letter in the mail two months in advance. If you don't receive a letter, please call our office to schedule the follow-up appointment.  Any Other Special Instructions Will Be Listed Below (If Applicable).   Coronary Calcium Scan A coronary calcium scan is an imaging test used to look for deposits of calcium and other fatty materials (plaques) in the inner lining of the blood vessels of the heart (coronary arteries). These deposits of calcium and plaques can partly clog and narrow the coronary arteries without producing any symptoms or warning signs. This puts  a person at risk for a heart attack. This test can detect these deposits before symptoms develop. Tell a health care provider about:  Any allergies you have.  All medicines you are taking, including vitamins, herbs, eye drops, creams, and over-the-counter medicines.  Any problems you or family members have had with anesthetic medicines.  Any blood disorders you have.  Any surgeries you have had.  Any medical conditions you have.  Whether you are pregnant or may be pregnant. What are the risks? Generally, this is a safe procedure. However, problems may occur, including:  Harm to a pregnant woman and her unborn baby. This test involves the use of radiation. Radiation exposure can be dangerous to a pregnant woman and her unborn baby. If you are pregnant, you generally should not have this procedure done.  Slight increase in the risk of cancer. This is because of the radiation involved in the test. What happens before the procedure? No preparation is needed for this procedure. What happens during the procedure?   You will undress and remove any jewelry around your neck or chest.  You will put on a hospital gown.  Sticky electrodes will be placed on your chest. The electrodes will be connected to an electrocardiogram (ECG) machine to record a tracing of the electrical activity of your heart.  A CT scanner will take pictures of your heart. During this time, you will be asked to lie still and hold your breath for 2-3 seconds while a picture of your heart is being taken. The procedure may vary among health care providers  and hospitals. What happens after the procedure?  You can get dressed.  You can return to your normal activities.  It is up to you to get the results of your test. Ask your health care provider, or the department that is doing the test, when your results will be ready. Summary  A coronary calcium scan is an imaging test used to look for deposits of calcium and  other fatty materials (plaques) in the inner lining of the blood vessels of the heart (coronary arteries).  Generally, this is a safe procedure. Tell your health care provider if you are pregnant or may be pregnant.  No preparation is needed for this procedure.  A CT scanner will take pictures of your heart.  You can return to your normal activities after the scan is done. This information is not intended to replace advice given to you by your health care provider. Make sure you discuss any questions you have with your health care provider. Document Released: 02/06/2008 Document Revised: 07/23/2017 Document Reviewed: 06/29/2016 Elsevier Patient Education  2020 [employer].

## 2019-07-19 NOTE — Assessment & Plan Note (Signed)
History of essential hypertension with blood pressure measured today 126/72. She is on losartan and hydrochlorothiazide.

## 2019-07-19 NOTE — Progress Notes (Signed)
07/19/2019 [name]   18-May-1956  [id number]  Primary Physician Dana Beals, NP Primary Cardiologist: Dana Harp MD [name], [name]  HPI:  [name] is a 63 y.o. fit appearing divorced African-American female mother of 2, grandmother of 84 grandchildren who works as an Librarian, academic at [employer]. She is currently working from home. She was referred by [name] for cardiovascular dilation because of coronary calcification found incidentally on chest CT performed 04/07/2019. Her risk factors include treated hypertension and hyperlipidemia. She is never smoked. There is no family history of heart disease. She is never had a heart attack or stroke. She denies chest pain or shortness of breath. She does have sarcoidosis which she has had since 1997. Her recent CT scan performed in August was unchanged from the one performed in January. She did have coronary calcification in the LAD and RCA seen on that CT scan however and was referred here for evaluation of this.   Current Meds  Medication Sig  . albuterol (VENTOLIN HFA) 108 (90 Base) MCG/ACT inhaler INHALE 1 PUFF INTO THE  LUNGS EVERY 4 HOURS AS  NEEDED FOR WHEEZING OR  SHORTNESS OF BREATH.  Marland Kitchen aspirin 81 MG tablet Take 81 mg by mouth daily.  Marland Kitchen aspirin-acetaminophen-caffeine (EXCEDRIN MIGRAINE) 250-250-65 MG per tablet Take 1 tablet by mouth every 6 (six) hours as needed for pain.  Marland Kitchen atorvastatin (LIPITOR) 20 MG tablet Take 20 mg by mouth daily.  . Cholecalciferol (VITAMIN D-3) 5000 UNITS TABS Take 5,000 Units by mouth daily.  Marland Kitchen dexlansoprazole (DEXILANT) 60 MG capsule Take 1 capsule (60 mg total) by mouth 2 (two) times daily.  . famotidine (PEPCID) 40 MG tablet Take 1 tablet (40 mg total) by mouth at bedtime.  . fenofibrate 160 MG tablet Take 160 mg by mouth daily.  . fluticasone (FLONASE) 50 MCG/ACT nasal spray USE 1 SPRAY INTO BOTH  NOSTRILS DAILY AS NEEDED  . ibuprofen (ADVIL,MOTRIN) 200 MG  tablet Take 400 mg by mouth every 6 (six) hours as needed.  . loratadine (CLARITIN) 10 MG tablet Take 1 tablet (10 mg total) by mouth daily.  Marland Kitchen losartan-hydrochlorothiazide (HYZAAR) 50-12.5 MG per tablet Take 1 tablet by mouth daily.  . metoCLOPramide (REGLAN) 10 MG tablet Take 10 mg by mouth 4 (four) times daily.  . Multiple Vitamin (MULTIVITAMIN) capsule Take 1 capsule by mouth daily.    . Naproxen Sod-Diphenhydramine (ALEVE PM PO) Take 1 tablet by mouth at bedtime as needed and may repeat dose one time if needed.  . niacin (NIASPAN) 1000 MG CR tablet Take 1,000 mg by mouth at bedtime.  . SYMBICORT 160-4.5 MCG/ACT inhaler USE 2 PUFFS BY MOUTH TWO  TIMES DAILY     No Known Allergies  Social History   Socioeconomic History  . Marital status: Divorced    Spouse name: Not on file  . Number of children: 2  . Years of education: college  . Highest education level: Not on file  Occupational History  . Occupation: Surveyor, mining: [employer]  . Financial resource strain: Not on file  . Food insecurity    Worry: Not on file    Inability: Not on file  . Transportation needs    Medical: Not on file    Non-medical: Not on file  Tobacco Use  . Smoking status: Never Smoker  . Smokeless tobacco: Never Used  Substance and Sexual Activity  . Alcohol use: No  .  Drug use: No  . Sexual activity: Not Currently    Partners: Male  Lifestyle  . Physical activity    Days per week: Not on file    Minutes per session: Not on file  . Stress: Not on file  Relationships  . Social Herbalist on phone: Not on file    Gets together: Not on file    Attends religious service: Not on file    Active member of club or organization: Not on file    Attends meetings of clubs or organizations: Not on file    Relationship status: Not on file  . Intimate partner violence    Fear of current or ex partner: Not on file    Emotionally abused: Not on file    Physically  abused: Not on file    Forced sexual activity: Not on file  Other Topics Concern  . Not on file  Social History Narrative   Patient lives at home alone.  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  Cardiovascular: negative for chest pain, dyspnea on exertion, edema, orthopnea, palpitations, paroxysmal nocturnal dyspnea or shortness of breath Dermatological: negative for rash Respiratory: negative for cough or wheezing Urologic: negative for hematuria Abdominal: negative for nausea, vomiting, diarrhea, bright red blood per rectum, melena, or hematemesis Neurologic: negative for visual changes, syncope, or dizziness All other systems reviewed and are otherwise negative except as noted above.    Blood pressure 126/72, pulse (!) 58, height 5\' 5"  (1.651 m), weight 158 lb (71.7 kg).  General appearance: alert and no distress Neck: no adenopathy, no carotid bruit, no JVD, supple, symmetrical, trachea midline and thyroid not enlarged, symmetric, no tenderness/mass/nodules Lungs: clear to auscultation bilaterally Heart: regular rate and rhythm, S1, S2 normal, no murmur, click, rub or gallop Extremities: extremities normal, atraumatic, no cyanosis or edema Pulses: 2+ and symmetric Skin: Skin color, texture, turgor normal. No rashes or lesions Neurologic: Alert and oriented X 3, normal strength and tone. Normal symmetric reflexes. Normal coordination and gait  EKG sinus bradycardia 58 with left atrial enlargement. I personally reviewed this EKG.  ASSESSMENT AND PLAN:   Essential hypertension History of essential hypertension with blood pressure measured today 126/72. She is on losartan and hydrochlorothiazide.  Hyperlipidemia History of hyperlipidemia on  atorvastatin followed by her PCP. We will get a lipid and liver profile today  Coronary artery calcification seen on CT scan [name] was referred to me by [name] for evaluation of coronary calcification found on chest CT performed because of sarcoidosis 04/07/2019. Calcification was in the LAD and RCA. She is totally asymptomatic with regard to this. I am going to get a coronary calcium score to further evaluate.      Dana Harp MD FACP,FACC,FAHA, [facility] 07/19/2019 10:36 AM

## 2019-07-19 NOTE — Assessment & Plan Note (Addendum)
History of hyperlipidemia on atorvastatin followed by her PCP. We will get a lipid and liver profile today

## 2019-07-19 NOTE — Assessment & Plan Note (Signed)
[name] was referred to me by Dr. Lamonte Sakai for evaluation of coronary calcification found on chest CT performed because of sarcoidosis 04/07/2019. Calcification was in the LAD and RCA. She is totally asymptomatic with regard to this. I am going to get a coronary calcium score to further evaluate.

## 2019-07-24 ENCOUNTER — Telehealth: Payer: Self-pay

## 2019-07-24 NOTE — Telephone Encounter (Signed)
LM2CB for labs and Cor Cal Score results.

## 2019-07-24 NOTE — Telephone Encounter (Signed)
-----   Message from Lorretta Harp, MD sent at 07/21/2019 12:24 PM EST ----- Based on her elevated Cor CA Score and LDL of 98 on Atorva 20 mg/day, I would increase this to 40 mg/day and re check an FLP in 2 months

## 2019-07-26 ENCOUNTER — Telehealth: Payer: Self-pay

## 2019-07-26 DIAGNOSIS — I10 Essential (primary) hypertension: Secondary | ICD-10-CM

## 2019-07-26 DIAGNOSIS — E785 Hyperlipidemia, unspecified: Secondary | ICD-10-CM

## 2019-07-26 MED ORDER — ATORVASTATIN CALCIUM 20 MG PO TABS: 20.0000 mg | ORAL_TABLET | Freq: Every day | ORAL | 3 refills | Status: DC

## 2019-07-26 NOTE — Telephone Encounter (Signed)
Advised pt of labs and med increase. Verbalized understanding. Put in new orders.

## 2019-07-26 NOTE — Telephone Encounter (Signed)
-----   Message from Lorretta Harp, MD sent at 07/21/2019 12:24 PM EST ----- Based on her elevated Cor CA Score and LDL of 98 on Atorva 20 mg/day, I would increase this to 40 mg/day and re check an FLP in 2 months

## 2019-07-27 ENCOUNTER — Telehealth: Payer: Self-pay | Admitting: *Deleted

## 2019-07-27 MED ORDER — DEXILANT 60 MG PO CPDR: 60.0000 mg | DELAYED_RELEASE_CAPSULE | Freq: Two times a day (BID) | ORAL | 1 refills | Status: DC

## 2019-07-27 NOTE — Addendum Note (Signed)
Addended by: Hartley Barefoot on: 07/27/2019 11:20 AM   Modules accepted: Orders

## 2019-07-27 NOTE — Telephone Encounter (Addendum)
Patient called and needs Dexilant 60 mg bid sent to [facility].   RX sent to [facility].

## 2019-08-05 ENCOUNTER — Other Ambulatory Visit: Payer: Self-pay | Admitting: Emergency Medicine

## 2019-08-22 ENCOUNTER — Telehealth: Payer: Self-pay

## 2019-08-22 NOTE — Telephone Encounter (Signed)
Fax from [place]:  [place] been approved until 08/21/2020.  Approval has been faxed to patient's pharmacy.

## 2019-08-22 NOTE — Telephone Encounter (Signed)
Prior authorization submitted via covermymeds for Dexilant 60 mg bid. Currently awaiting approval/denial.

## 2019-08-24 ENCOUNTER — Other Ambulatory Visit: Payer: Self-pay | Admitting: *Deleted

## 2019-08-28 ENCOUNTER — Telehealth: Payer: Self-pay

## 2019-08-28 NOTE — Telephone Encounter (Signed)
PA for Dexilant was initiated through [email or web address], awaiting approval.

## 2019-08-28 NOTE — Telephone Encounter (Signed)
Prior authorization was previously approved through 07/2020. Please see previous telephone encounter.

## 2019-09-12 ENCOUNTER — Other Ambulatory Visit: Payer: Self-pay

## 2019-09-12 ENCOUNTER — Encounter: Payer: Self-pay | Admitting: Allergy and Immunology

## 2019-09-12 ENCOUNTER — Ambulatory Visit (INDEPENDENT_AMBULATORY_CARE_PROVIDER_SITE_OTHER): Payer: 59 | Admitting: Allergy and Immunology

## 2019-09-12 VITALS — HR 82 | Temp 97.6°F | Resp 18

## 2019-09-12 DIAGNOSIS — J4540 Moderate persistent asthma, uncomplicated: Secondary | ICD-10-CM | POA: Diagnosis not present

## 2019-09-12 DIAGNOSIS — K219 Gastro-esophageal reflux disease without esophagitis: Secondary | ICD-10-CM

## 2019-09-12 DIAGNOSIS — J3089 Other allergic rhinitis: Secondary | ICD-10-CM

## 2019-09-12 DIAGNOSIS — D860 Sarcoidosis of lung: Secondary | ICD-10-CM

## 2019-09-12 MED ORDER — PANTOPRAZOLE SODIUM 40 MG PO TBEC: 40.0000 mg | DELAYED_RELEASE_TABLET | Freq: Every day | ORAL | 1 refills | Status: DC

## 2019-09-12 MED ORDER — FAMOTIDINE 40 MG PO TABS: 40.0000 mg | ORAL_TABLET | Freq: Every day | ORAL | 1 refills | Status: DC

## 2019-09-12 NOTE — Patient Instructions (Addendum)
  1.  Allergen avoidance measures - house dust mite  2.  Continue to treat inflammation of airway:   A.  Symbicort 160-2 inhalations 1-2 times a day  B.  Flonase 1-2 sprays each nostril once a day following nasal wash  3.  Continue to treat reflux:   A.  Eliminate use of fish oil, caffeine, chocolate  B.  Raise head of bed  C.  No late meals  D.  Pantoprazole 40 mg twice a day  E.  Famotidine 40 mg in evening  F.  Metoclopramide 10 mg in evening  4.  If needed:   A.  OTC antihistamine   B.  Albuterol HFA-2 inhalations once a day  5.  Return to clinic in 12 weeks or earlier if problem  6.  Obtain DTaP booster and COVID vaccine when available

## 2019-09-12 NOTE — Progress Notes (Signed)
[place]   Follow-up Note  Referring Provider: [name], NP Primary Provider: [name], NP Date of Office Visit: 09/12/2019  Subjective:   [name] (DOB: 1955-10-18) is a 64 y.o. female who returns to the [facility] [place] on 09/12/2019 in re-evaluation of the following:  HPI: Jamarie returns to this clinic in reevaluation of her persistent coughing believed secondary to a component of LPR and rhinitis and asthma in the context of sarcoidosis treated with low-dose oral steroids.  Her last visit to this clinic was 20 June 2019.  Her cough has dramatically improved.  Her throat clearing has dramatically improved.  She no longer has any regurgitation and does not have any vomiting.  Her voice is much less raspy at this point.  She continues to use Symbicort twice a day and Flonase every day and has been using a proton pump inhibitor and H2 receptor blocker and metoclopramide.  Rarely does she use a short acting bronchodilator.  Because of an insurance issue she is now using pantoprazole instead of her other proton pump inhibitor.  She remains away from caffeine and chocolate for the most part and has not restarted her fish oil.  She did obtain a flu vaccine but has not obtained her DTaP booster was which was suggested based upon low levels of antibodies directed against these immunogens.  She informs me that she had a cardiac calcification CT scan performed which apparently identified significant calcification of her coronary arteries.  Allergies as of 09/12/2019   No Known Allergies     Medication List      albuterol 108 (90 Base) MCG/ACT inhaler Commonly known as: VENTOLIN HFA USE 1 INHALATION BY MOUTH  EVERY 4 HOURS AS NEEDED FOR WHEEZING OR SHORTNESS OF  BREATH.   ALEVE PM PO Take 1 tablet by mouth at bedtime as needed and may repeat dose one time if needed.   aspirin 81 MG tablet Take  81 mg by mouth daily.   aspirin-acetaminophen-caffeine 250-250-65 MG tablet Commonly known as: EXCEDRIN MIGRAINE Take 1 tablet by mouth every 6 (six) hours as needed for pain.   atorvastatin 20 MG tablet Commonly known as: LIPITOR Take 1 tablet (20 mg total) by mouth daily.   Dexilant 60 MG capsule Generic drug: dexlansoprazole Take 1 capsule (60 mg total) by mouth 2 (two) times daily.   Dexilant 60 MG capsule Generic drug: dexlansoprazole Take 1 capsule (60 mg total) by mouth 2 (two) times daily.   famotidine 40 MG tablet Commonly known as: PEPCID Take 1 tablet (40 mg total) by mouth at bedtime.   fenofibrate 160 MG tablet Take 160 mg by mouth daily.   fluticasone 50 MCG/ACT nasal spray Commonly known as: FLONASE USE 1 SPRAY INTO BOTH  NOSTRILS DAILY AS NEEDED   ibuprofen 200 MG tablet Commonly known as: ADVIL Take 400 mg by mouth every 6 (six) hours as needed.   loratadine 10 MG tablet Commonly known as: CLARITIN Take 1 tablet (10 mg total) by mouth daily.   losartan-hydrochlorothiazide 50-12.5 MG tablet Commonly known as: HYZAAR Take 1 tablet by mouth daily.   metoCLOPramide 10 MG tablet Commonly known as: REGLAN Take 10 mg by mouth 4 (four) times daily.   multivitamin capsule Take 1 capsule by mouth daily.   niacin 1000 MG CR tablet Commonly known as: NIASPAN Take 1,000 mg by mouth at bedtime.   predniSONE 5 MG tablet Commonly known as: DELTASONE  TAKE 1 TABLET BY MOUTH  DAILY WITH BREAKFAST   Symbicort 160-4.5 MCG/ACT inhaler Generic drug: budesonide-formoterol USE 2 PUFFS BY MOUTH TWO  TIMES DAILY   Vitamin D-3 125 MCG (5000 UT) Tabs Take 5,000 Units by mouth daily.       Past Medical History:  Diagnosis Date  . Angio-edema   . Chronic sinusitis   . Cough   . Diabetes mellitus without complication ([place])   . Diverticulosis 2009   mild sigmoid  . Hypertension   . Recurrent upper respiratory infection (URI)   . Sarcoidosis     Past  Surgical History:  Procedure Laterality Date  . COLONOSCOPY    . LYMPHADENECTOMY    . NOSE SURGERY    . SINOSCOPY    . TUBAL LIGATION    . VESICOVAGINAL FISTULA CLOSURE W/ TAH    . VIDEO BRONCHOSCOPY Bilateral 07/29/2017   Procedure: VIDEO BRONCHOSCOPY WITH FLUORO;  Surgeon: Collene Gobble, MD;  Location: [facility];  Service: Cardiopulmonary;  Laterality: Bilateral;    Review of systems negative except as noted in HPI / PMHx or noted below:  Review of Systems  Constitutional: Negative.   HENT: Negative.   Eyes: Negative.   Respiratory: Negative.   Cardiovascular: Negative.   Gastrointestinal: Negative.   Genitourinary: Negative.   Musculoskeletal: Negative.   Skin: Negative.   Neurological: Negative.   Endo/Heme/Allergies: Negative.   Psychiatric/Behavioral: Negative.      Objective:   Vitals:   09/12/19 1624  Pulse: 82  Resp: 18  Temp: 97.6 F (36.4 C)  SpO2: 97%          Physical Exam Constitutional:      Appearance: She is not diaphoretic.  HENT:     Head: Normocephalic.     Right Ear: Tympanic membrane, ear canal and external ear normal.     Left Ear: Tympanic membrane, ear canal and external ear normal.     Nose: Nose normal. No mucosal edema or rhinorrhea.     Mouth/Throat:     Pharynx: Uvula midline. No oropharyngeal exudate.  Eyes:     Conjunctiva/sclera: Conjunctivae normal.  Neck:     Thyroid: No thyromegaly.     Trachea: Trachea normal. No tracheal tenderness or tracheal deviation.  Cardiovascular:     Rate and Rhythm: Normal rate and regular rhythm.     Heart sounds: Normal heart sounds, S1 normal and S2 normal. No murmur.  Pulmonary:     Effort: No respiratory distress.     Breath sounds: Normal breath sounds. No stridor. No wheezing or rales.  Lymphadenopathy:     Head:     Right side of head: No tonsillar adenopathy.     Left side of head: No tonsillar adenopathy.     Cervical: No cervical adenopathy.  Skin:    Findings: No  erythema or rash.     Nails: There is no clubbing.  Neurological:     Mental Status: She is alert.     Diagnostics:    Spirometry was performed and demonstrated an FEV1 of 1.43 at 67 % of predicted.  Assessment and Plan:   1. Asthma, moderate persistent, well-controlled   2. Sarcoidosis of lung ([place])   3. Perennial allergic rhinitis   4. LPRD (laryngopharyngeal reflux disease)     1.  Allergen avoidance measures - house dust mite  2.  Continue to treat inflammation of airway:   A.  Symbicort 160-2 inhalations 1-2 times a day  B.  Flonase  1-2 sprays each nostril once a day following nasal wash  3.  Continue to treat reflux:   A.  Eliminate use of fish oil, caffeine, chocolate  B.  Raise head of bed  C.  No late meals  D.  Pantoprazole 40 mg twice a day  E.  Famotidine 40 mg in evening  F.  Metoclopramide 10 mg in evening  4.  If needed:   A.  OTC antihistamine   B.  Albuterol HFA-2 inhalations once a day  5.  Return to clinic in 12 weeks or earlier if problem  6.  Obtain DTaP booster and COVID vaccine when available  Tajae is really doing relatively well at this point and I will see her back in this clinic while she continues to utilize a collection of agents directed against inflammation of her airway and reflux as noted above.  She does have the option of lowering her Symbicort to 1 time per day and her Flonase to just 1 spray each nostril once a day and of course she can always go up on dosage should she develop more respiratory tract symptoms with this lowering dose.  I did encourage her to obtain the booster for DTaP and of course obtain the Covid vaccine.  I also had a talk with her today about her cardiac calcifications and mentioned that she may want to change her diet to 1 with very little saturated fat or sugar consumption and attempting to approach a plant-based diet as best as possible.  I will see her back in this clinic in 12 weeks or earlier if there is a  problem.  Allena Katz, MD Allergy / Immunology [place]

## 2019-09-13 ENCOUNTER — Encounter: Payer: Self-pay | Admitting: Allergy and Immunology

## 2019-09-27 NOTE — Progress Notes (Signed)
[facility]    Provider:  Dr Jaynee Eagles Requesting Provider: [name], NP Primary Care Provider:  [name], NP  CC:  Memory  HPI:  [name] is a 64 y.o. female here as requested by [name], NP for Memory impairment.  Past medical history sarcoidosis, recurrent upper respiratory infection, hypertension, diabetes, sinusitis chronically, hereditary and idiopathic peripheral neuropathy.  I reviewed [name] notes, patient last had labs taken August 03, 2019, creatinine 0.95, BUN 20, otherwise CMP normal, lipid panel shows cholesterol total 180, LDL 102, HDL 63, hemoglobin A1c 6.5, vitamin B12 1937, TSH normal 1.14, rheumatoid factor less than 14, ANA negative, CBC normal, sed rate 14 normal, I reviewed examination which was normal including psychiatric, ear nose throat, lungs, musculoskeletal, cardiovascular, memory impairment. Patient is here alone, memory problems going on for years. No family history of dementia. She has normally been very sharp, when she was young, 10 years ago she started noticing she wasn't as sharp, she works at [employer], she had to take more notes, things she cant remember like a license, not quickly progressing. She manages day to day, not distractable or impatient. She sometimes can;t find the right word to get out and has to use another word. No accidents in the home, she pays her own bills and takes her medication independently, still working sucessfully. No other focal neurologic deficits, associated symptoms, inciting events or modifiable factors.  Reviewed notes, labs and imaging from outside physicians, which showed:  MRI 2014 reviewed images and agree with the following:  Equivocal MRI brain (with and without) demonstrating: 1. Few punctate foci of non-specific gliosis. 2. No acute findings.    Review of Systems: Patient complains of symptoms per HPI as well as the following symptoms: memory  loss. Pertinent negatives and positives per HPI. All others negative.   Social History   Socioeconomic History  . Marital status: Divorced    Spouse name: Not on file  . Number of children: 2  . Years of education: college  . Highest education level: Not on file  Occupational History  . Occupation: CONSULTANT    Employer: [employer]  Tobacco Use  . Smoking status: Never Smoker  . Smokeless tobacco: Never Used  Substance and Sexual Activity  . Alcohol use: No  . Drug use: No  . Sexual activity: Not Currently    Partners: Male  Other Topics Concern  . Not on file  Social History Narrative   Patient lives at home alone.  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 Difficulty of Paying Living Expenses: Not on file  Food Insecurity:   . Worried About Charity fundraiser in the Last Year: Not on file  . Ran Out of Food in the Last Year: Not on file  Transportation Needs:   . Lack of Transportation (Medical): Not on file  . Lack of Transportation (Non-Medical): Not on file  Physical Activity:   . Days of Exercise per Week: Not on file  . Minutes of Exercise per Session: Not on file  Stress:   . Feeling of Stress : Not on file  Social Connections:   . Frequency of Communication with Friends and Family: Not on file  . Frequency of Social Gatherings  with Friends and Family: Not on file  . Attends Religious Services: Not on file  . Active Member of Clubs or Organizations: Not on file  . Attends Archivist Meetings: Not on file  . Marital Status: Not on file  Intimate Partner Violence:   . Fear of Current or Ex-Partner: Not on file  . Emotionally Abused: Not on file  . Physically Abused: Not on file  . Sexually Abused: Not on file    Family History  Problem Relation Age  of Onset  . Stroke Father   . Heart murmur Father   . Diabetes Father   . Emphysema Mother   . COPD Mother   . Breast cancer Sister   . Colon cancer Neg Hx   . Esophageal cancer Neg Hx   . Rectal cancer Neg Hx   . Urticaria Neg Hx   . Eczema Neg Hx   . Immunodeficiency Neg Hx   . Atopy Neg Hx   . Asthma Neg Hx   . Angioedema Neg Hx   . Allergic rhinitis Neg Hx   . Dementia Neg Hx        none that she knows of   . Alzheimer's disease Neg Hx        none that she knows of   . Memory loss Neg Hx        none that she knows of     Past Medical History:  Diagnosis Date  . Angio-edema   . Chronic sinusitis   . Cough   . Diabetes mellitus without complication ([place])   . Diverticulosis 2009   mild sigmoid  . High cholesterol   . Hypertension   . Recurrent upper respiratory infection (URI)   . Sarcoidosis   . Vitamin D deficiency     Patient Active Problem List   Diagnosis Date Noted  . Essential hypertension 07/19/2019  . Hyperlipidemia 07/19/2019  . Coronary artery calcification seen on CT scan 07/19/2019  . Post-nasal drip 02/22/2019  . Colon cancer screening 02/22/2019  . Diastasis recti 02/22/2019  . Interstitial pneumonia ([place]) suspected on CT scan 01/02/2019  . Abnormal finding on lung imaging 10/28/2018  . GERD without esophagitis? 10/27/2018  . 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 Sarcoid arthropathy ([place]) 01/08/2016  . Hereditary and idiopathic peripheral neuropathy 05/20/2014  . Anal condyloma 08/03/2013  . Chronic sinusitis 09/15/2011  . Chronic cough 06/16/2010  . Sarcoidosis 07/10/2008    Past Surgical History:  Procedure Laterality Date  . COLONOSCOPY    . LYMPHADENECTOMY    . NOSE SURGERY    . SINOSCOPY    . TUBAL LIGATION    . VESICOVAGINAL FISTULA CLOSURE W/ TAH    . VIDEO BRONCHOSCOPY Bilateral 07/29/2017   Procedure: VIDEO BRONCHOSCOPY WITH FLUORO;  Surgeon: Collene Gobble, MD;  Location: [facility];  Service: Cardiopulmonary;  Laterality: Bilateral;      Current Outpatient Medications  Medication Sig Dispense Refill  . albuterol (VENTOLIN HFA) 108 (90 Base) MCG/ACT inhaler USE 1 INHALATION BY MOUTH  EVERY 4 HOURS AS NEEDED FOR WHEEZING OR SHORTNESS OF  BREATH. 25.5 g 3  . aspirin 81 MG tablet Take 81 mg by mouth daily.    Marland Kitchen aspirin-acetaminophen-caffeine (EXCEDRIN MIGRAINE) 250-250-65 MG per tablet Take 1 tablet by mouth every 6 (six) hours as needed for pain.    Marland Kitchen atorvastatin (LIPITOR) 20 MG tablet Take 1 tablet (20 mg total) by mouth daily. 90 tablet 3  . Cholecalciferol (VITAMIN D-3) 5000 UNITS TABS  Take 5,000 Units by mouth daily.    Marland Kitchen dexlansoprazole (DEXILANT) 60 MG capsule Take 1 capsule (60 mg total) by mouth 2 (two) times daily. 180 capsule 1  . dexlansoprazole (DEXILANT) 60 MG capsule Take 1 capsule (60 mg total) by mouth 2 (two) times daily. 180 capsule 1  . famotidine (PEPCID) 40 MG tablet Take 1 tablet (40 mg total) by mouth at bedtime. 90 tablet 1  . fenofibrate 160 MG tablet Take 160 mg by mouth daily.    . fluticasone (FLONASE) 50 MCG/ACT nasal spray USE 1 SPRAY INTO BOTH  NOSTRILS DAILY AS NEEDED 32 g 3  . ibuprofen (ADVIL,MOTRIN) 200 MG tablet Take 400 mg by mouth every 6 (six) hours as needed.    . loratadine (CLARITIN) 10 MG tablet Take 1 tablet (10 mg total) by mouth daily. 90 tablet 0  . losartan-hydrochlorothiazide (HYZAAR) 50-12.5 MG per tablet Take 1 tablet by mouth daily.    . metoCLOPramide (REGLAN) 10 MG tablet Take 10 mg by mouth 4 (four) times daily.    . Multiple Vitamin (MULTIVITAMIN) capsule Take 1 capsule by mouth daily.      . Naproxen Sod-Diphenhydramine (ALEVE PM PO) Take 1 tablet by mouth at bedtime as needed and may repeat dose one time if needed.    . niacin (NIASPAN) 1000 MG CR tablet Take 1,000 mg by mouth at bedtime.    . pantoprazole (PROTONIX) 40 MG tablet Take 1 tablet (40 mg total) by mouth daily. 180 tablet 1  . predniSONE (DELTASONE) 5 MG tablet TAKE 1 TABLET BY MOUTH  DAILY WITH BREAKFAST  90 tablet 3  . SYMBICORT 160-4.5 MCG/ACT inhaler USE 2 PUFFS BY MOUTH TWO  TIMES DAILY (Patient taking differently: Inhale 2 puffs into the lungs daily. ) 30.6 g 4   No current facility-administered medications for this visit.    Allergies as of 09/28/2019  . (No Known Allergies)    Vitals: BP 117/73 (BP Location: Right Arm, Patient Position: Sitting)   Pulse 68   Temp 97.6 F (36.4 C) Comment: taken at front  Ht 5\' 5"  (1.651 m)   Wt 160 lb (72.6 kg)   BMI 26.63 kg/m  Last Weight:  Wt Readings from Last 1 Encounters:  09/28/19 160 lb (72.6 kg)   Last Height:   Ht Readings from Last 1 Encounters:  09/28/19 5\' 5"  (1.651 m)     Physical exam: Exam: Gen: NAD, conversant, well nourised,  well groomed                     CV: RRR, no MRG. No Carotid Bruits. No peripheral edema, warm, nontender Eyes: Conjunctivae clear without exudates or hemorrhage  Neuro: Detailed Neurologic Exam  Speech:    Speech is normal; fluent and spontaneous with normal comprehension.  Cognition:    The patient is oriented to person, place, and time;     recent and remote memory intact;     language fluent;     normal attention, concentration,     fund of knowledge Cranial Nerves:    The pupils are equal, round, and reactive to light. The fundi are normal and spontaneous venous pulsations are present. Visual fields are full to finger confrontation. Extraocular movements are intact. Trigeminal sensation is intact and the muscles of mastication are normal. The face is symmetric. The palate elevates in the midline. Hearing intact. Voice is normal. Shoulder shrug is normal. The tongue has normal motion without fasciculations.   Coordination:  Normal finger to nose and heel to shin. Normal rapid alternating movements.   Gait:    Heel-toe and tandem gait are normal.   Motor Observation:    No asymmetry, no atrophy, and no involuntary movements noted. Tone:    Normal muscle tone.    Posture:     Posture is normal. normal erect    Strength:    Strength is V/V in the upper and lower limbs.      Sensation: intact to LT     Reflex Exam:  DTR's:    Deep tendon reflexes in the upper and lower extremities are normal bilaterally.   Toes:    The toes are downgoing bilaterally.   Clonus:    Clonus is absent.    Assessment/Plan:  64 year old female her for memory loss, I think this is multifactorial including normal cognitive aging however given history of sarcoidosis, possibly aphasia need to rule out strokes or vascular problems or other with MRI of the brain. She has had extensive lab testing in the past. Discussed memory compensation techniques.   Orders Placed This Encounter  Procedures  . MR BRAIN W WO CONTRAST     Cc: [name], NP,    Sarina Ill, MD  [facility] [address] [place], [place] [address]  Phone [phone] Fax [phone]

## 2019-09-28 ENCOUNTER — Ambulatory Visit (INDEPENDENT_AMBULATORY_CARE_PROVIDER_SITE_OTHER): Payer: 59 | Admitting: Neurology

## 2019-09-28 ENCOUNTER — Other Ambulatory Visit: Payer: Self-pay

## 2019-09-28 ENCOUNTER — Encounter: Payer: Self-pay | Admitting: Neurology

## 2019-09-28 VITALS — BP 117/73 | HR 68 | Temp 97.6°F | Ht 65.0 in | Wt 160.0 lb

## 2019-09-28 DIAGNOSIS — R4701 Aphasia: Secondary | ICD-10-CM | POA: Diagnosis not present

## 2019-09-28 DIAGNOSIS — R413 Other amnesia: Secondary | ICD-10-CM | POA: Diagnosis not present

## 2019-09-28 DIAGNOSIS — R200 Anesthesia of skin: Secondary | ICD-10-CM | POA: Diagnosis not present

## 2019-09-28 NOTE — Patient Instructions (Signed)
MRI brain w/wo contrast  Management of Memory Problems  There are some general things you can do to help manage your memory problems.  Your memory may not in fact recover, but by using techniques and strategies you will be able to manage your memory difficulties better.  1)  Establish a routine.  Try to establish and then stick to a regular routine.  By doing this, you will get used to what to expect and you will reduce the need to rely on your memory.  Also, try to do things at the same time of day, such as taking your medication or checking your calendar first thing in the morning.  Think about think that you can do as a part of a regular routine and make a list.  Then enter them into a daily planner to remind you.  This will help you establish a routine.  2)  Organize your environment.  Organize your environment so that it is uncluttered.  Decrease visual stimulation.  Place everyday items such as keys or cell phone in the same place every day (ie.  Basket next to front door)  Use post it notes with a brief message to yourself (ie. Turn off light, lock the door)  Use labels to indicate where things go (ie. Which cupboards are for food, dishes, etc.)  Keep a notepad and pen by the telephone to take messages  3)  Memory Aids  A diary or journal/notebook/daily planner  Making a list (shopping list, chore list, to do list that needs to be done)  Using an alarm as a reminder (kitchen timer or cell phone alarm)  Using cell phone to store information (Notes, Calendar, Reminders)  Calendar/White board placed in a prominent position  Post-it notes  In order for memory aids to be useful, you need to have good habits.  It's no good remembering to make a note in your journal if you don't remember to look in it.  Try setting aside a certain time of day to look in journal.  4)  Improving mood and managing fatigue.  There may be other factors that contribute to memory difficulties.   Factors, such as anxiety, depression and tiredness can affect memory.  Regular gentle exercise can help improve your mood and give you more energy.  Simple relaxation techniques may help relieve symptoms of anxiety  Try to get back to completing activities or hobbies you enjoyed doing in the past.  Learn to pace yourself through activities to decrease fatigue.  Find out about some local support groups where you can share experiences with others.  Try and achieve 7-8 hours of sleep at night. Memory Compensation Strategies  2. Use "WARM" strategy.  W= write it down  A= associate it  R= repeat it  M= make a mental note  2.   You can keep a Social worker.  Use a 3-ring notebook with sections for the following: calendar, important names and phone numbers,  medications, doctors' names/phone numbers, lists/reminders, and a section to journal what you did  each day.   3.    Use a calendar to write appointments down.  4.    Write yourself a schedule for the day.  This can be placed on the calendar or in a separate section of the Memory Notebook.  Keeping a  regular schedule can help memory.  5.    Use medication organizer with sections for each day or morning/evening pills.  You may need help loading it  6.    Keep a basket, or pegboard by the door.  Place items that you need to take out with you in the basket or on the pegboard.  You may also want to  include a message board for reminders.  7.    Use sticky notes.  Place sticky notes with reminders in a place where the task is performed.  For example: " turn off the  stove" placed by the stove, "lock the door" placed on the door at eye level, " take your medications" on  the bathroom mirror or by the place where you normally take your medications.  8.    Use alarms/timers.  Use while cooking to remind yourself to check on food or as a reminder to take your medicine, or as a  reminder to make a call, or as a reminder to perform  another task, etc.

## 2019-10-09 ENCOUNTER — Encounter: Payer: Self-pay | Admitting: Allergy and Immunology

## 2019-10-10 ENCOUNTER — Other Ambulatory Visit: Payer: Self-pay | Admitting: *Deleted

## 2019-10-10 MED ORDER — PANTOPRAZOLE SODIUM 40 MG PO TBEC: 40.0000 mg | DELAYED_RELEASE_TABLET | Freq: Two times a day (BID) | ORAL | 1 refills | Status: DC

## 2019-10-17 ENCOUNTER — Other Ambulatory Visit: Payer: Self-pay | Admitting: *Deleted

## 2019-10-17 MED ORDER — PANTOPRAZOLE SODIUM 40 MG PO TBEC: DELAYED_RELEASE_TABLET | ORAL | 0 refills | Status: DC

## 2019-12-05 ENCOUNTER — Other Ambulatory Visit: Payer: Self-pay

## 2019-12-05 ENCOUNTER — Ambulatory Visit (INDEPENDENT_AMBULATORY_CARE_PROVIDER_SITE_OTHER): Payer: 59 | Admitting: Allergy and Immunology

## 2019-12-05 ENCOUNTER — Encounter: Payer: Self-pay | Admitting: Allergy and Immunology

## 2019-12-05 VITALS — BP 122/82 | HR 64 | Temp 97.2°F | Resp 16

## 2019-12-05 DIAGNOSIS — J4540 Moderate persistent asthma, uncomplicated: Secondary | ICD-10-CM | POA: Diagnosis not present

## 2019-12-05 DIAGNOSIS — K219 Gastro-esophageal reflux disease without esophagitis: Secondary | ICD-10-CM

## 2019-12-05 DIAGNOSIS — D860 Sarcoidosis of lung: Secondary | ICD-10-CM

## 2019-12-05 DIAGNOSIS — J849 Interstitial pulmonary disease, unspecified: Secondary | ICD-10-CM | POA: Diagnosis not present

## 2019-12-05 DIAGNOSIS — J3089 Other allergic rhinitis: Secondary | ICD-10-CM

## 2019-12-05 NOTE — Patient Instructions (Addendum)
  1.  Allergen avoidance measures - house dust mite  2.  Continue to treat inflammation of airway:   A.  Breztri - 2 inhalations 2 times a day (replaces Symbicort)  B.  Flonase 1-2 sprays each nostril once a day following nasal wash  3.  Continue to treat reflux:   A.  Eliminate use of fish oil, caffeine, chocolate  B.  Raise head of bed  C.  No late meals  D.  Pantoprazole 40 mg twice a day  E.  Famotidine 40 mg in evening  F.  Metoclopramide 10 mg 2 times per day  4.  If needed:   A.  OTC antihistamine   B.  Albuterol HFA-2 inhalations once a day  5.  Return to clinic in 12 weeks or earlier if problem  6.  Obtain DTaP booster    7. Repeat CT scan of chest???  Bronchoscopy???

## 2019-12-05 NOTE — Progress Notes (Signed)
[facility]   Follow-up Note   Referring Provider: [name], NP Primary Provider: [name], NP Date of Office Visit: 12/05/2019  Subjective:   [name] (DOB: Jul 29, 1956) is a 64 y.o. female who returns to the [facility] [place] on 12/05/2019 in re-evaluation of the following:  HPI: [name] returns to this clinic in reevaluation of her multifactorial form of cough with contributions from asthma, sarcoidosis with evidence of fibrosis and secondary mycetoma, and reflux.  Her last visit to this clinic was 12 September 2019.  Although she has improved dramatically with her cough and her throat clearing and no longer has any regurgitation or posttussive emesis and much less raspy voice while utilizing a combination of therapy directed against respiratory tract inflammation and reflux she still continues to have some cough.  She has very little upper airway symptoms and no classic reflux symptoms at this point in time.  She has received 2 Covid vaccinations.  Allergies as of 12/05/2019   No Known Allergies     Medication List      albuterol 108 (90 Base) MCG/ACT inhaler Commonly known as: VENTOLIN HFA USE 1 INHALATION BY MOUTH  EVERY 4 HOURS AS NEEDED FOR WHEEZING OR SHORTNESS OF  BREATH.   ALEVE PM PO Take 1 tablet by mouth at bedtime as needed and may repeat dose one time if needed.   aspirin 81 MG tablet Take 81 mg by mouth daily.   aspirin-acetaminophen-caffeine 250-250-65 MG tablet Commonly known as: EXCEDRIN MIGRAINE Take 1 tablet by mouth every 6 (six) hours as needed for pain.   atorvastatin 20 MG tablet Commonly known as: LIPITOR Take 1 tablet (20 mg total) by mouth daily.   famotidine 40 MG tablet Commonly known as: PEPCID Take 1 tablet (40 mg total) by mouth at bedtime.   fenofibrate 160 MG tablet Take 160 mg by mouth daily.   fluticasone 50 MCG/ACT nasal spray Commonly known as:  FLONASE USE 1 SPRAY INTO BOTH  NOSTRILS DAILY AS NEEDED   ibuprofen 200 MG tablet Commonly known as: ADVIL Take 400 mg by mouth every 6 (six) hours as needed.   loratadine 10 MG tablet Commonly known as: CLARITIN Take 1 tablet (10 mg total) by mouth daily.   losartan-hydrochlorothiazide 50-12.5 MG tablet Commonly known as: HYZAAR Take 1 tablet by mouth daily.   metoCLOPramide 10 MG tablet Commonly known as: REGLAN Take 10 mg by mouth 4 (four) times daily.   multivitamin capsule Take 1 capsule by mouth daily.   niacin 1000 MG CR tablet Commonly known as: NIASPAN Take 1,000 mg by mouth at bedtime.   pantoprazole 40 MG tablet Commonly known as: Protonix Take one tablet twice daily   Symbicort 160-4.5 MCG/ACT inhaler Generic drug: budesonide-formoterol USE 2 PUFFS BY MOUTH TWO  TIMES DAILY What changed: See the new instructions.   Vitamin D-3 125 MCG (5000 UT) Tabs Take 5,000 Units by mouth daily.       Past Medical History:  Diagnosis Date  . Angio-edema   . Chronic sinusitis   . Cough   . Diabetes mellitus without complication ([place])   . Diverticulosis 2009   mild sigmoid  . High cholesterol   . Hypertension   . Recurrent upper respiratory infection (URI)   . Sarcoidosis   . Vitamin D deficiency     Past Surgical History:  Procedure Laterality Date  . COLONOSCOPY    . LYMPHADENECTOMY    .  NOSE SURGERY    . SINOSCOPY    . TUBAL LIGATION    . VESICOVAGINAL FISTULA CLOSURE W/ TAH    . VIDEO BRONCHOSCOPY Bilateral 07/29/2017   Procedure: VIDEO BRONCHOSCOPY WITH FLUORO;  Surgeon: Collene Gobble, MD;  Location: [facility];  Service: Cardiopulmonary;  Laterality: Bilateral;    Review of systems negative except as noted in HPI / PMHx or noted below:  Review of Systems  Constitutional: Negative.   HENT: Negative.   Eyes: Negative.   Respiratory: Negative.   Cardiovascular: Negative.   Gastrointestinal: Negative.   Genitourinary: Negative.     Musculoskeletal: Negative.   Skin: Negative.   Neurological: Negative.   Endo/Heme/Allergies: Negative.   Psychiatric/Behavioral: Negative.      Objective:   Vitals:   12/05/19 1618  BP: 122/82  Pulse: 64  Resp: 16  Temp: (!) 97.2 F (36.2 C)  SpO2: 98%          Physical Exam Constitutional:      Appearance: She is not diaphoretic.  HENT:     Head: Normocephalic.     Right Ear: Tympanic membrane, ear canal and external ear normal.     Left Ear: Tympanic membrane, ear canal and external ear normal.     Nose: Nose normal. No mucosal edema or rhinorrhea.     Mouth/Throat:     Pharynx: Uvula midline. No oropharyngeal exudate.  Eyes:     Conjunctiva/sclera: Conjunctivae normal.  Neck:     Thyroid: No thyromegaly.     Trachea: Trachea normal. No tracheal tenderness or tracheal deviation.  Cardiovascular:     Rate and Rhythm: Normal rate and regular rhythm.     Heart sounds: Normal heart sounds, S1 normal and S2 normal. No murmur.  Pulmonary:     Effort: No respiratory distress.     Breath sounds: Normal breath sounds. No stridor. No wheezing (Inspiratory crackles bilateral posterior lung fields) or rales.  Lymphadenopathy:     Head:     Right side of head: No tonsillar adenopathy.     Left side of head: No tonsillar adenopathy.     Cervical: No cervical adenopathy.  Skin:    Findings: No erythema or rash.     Nails: There is no clubbing.  Neurological:     Mental Status: She is alert.     Diagnostics:    Spirometry was performed and demonstrated an FEV1 of 1.35 at 63 % of predicted.  The patient had an Asthma Control Test with the following results: ACT Total Score: 24.    Assessment and Plan:   1. Asthma, moderate persistent, well-controlled   2. Sarcoidosis of lung ([place])   3. Interstitial lung disease ([place])   4. Perennial allergic rhinitis   5. LPRD (laryngopharyngeal reflux disease)     1.  Allergen avoidance measures - house dust mite  2.   Continue to treat inflammation of airway:   A.  Breztri - 2 inhalations 2 times a day (replaces Symbicort)  B.  Flonase 1-2 sprays each nostril once a day following nasal wash  3.  Continue to treat reflux:   A.  Eliminate use of fish oil, caffeine, chocolate  B.  Raise head of bed  C.  No late meals  D.  Pantoprazole 40 mg twice a day  E.  Famotidine 40 mg in evening  F.  Metoclopramide 10 mg 2 times per day  4.  If needed:   A.  OTC antihistamine   B.  Albuterol HFA-2 inhalations once a day  5.  Return to clinic in 12 weeks or earlier if problem  6.  Obtain DTaP booster    7. Repeat CT scan of chest???  Bronchoscopy???  It is difficult to work through all of the etiologic factors responsible for Page's respiratory tract symptoms and to try to identify the etiologic factor that is playing a greater role in continuing to have her cough.  Certainly she is a lot better with medications directed against inflammation of her airway and a very aggressive approach to her reflux.  I would like to have her use a triple inhaler for a period in time to see if there is may be some more reversible abnormalities within her airway.  If for some reason she enhances the intensity and frequency of her cough then I think she will probably need to have another imaging procedure of her chest and possibly another bronchoscopy to look for the presence of a progressive fibrotic process.  She never did obtain her DTaP booster shot and I made the recommendation again today that she obtain that vaccination as she is deficient in protective antibodies for that immunogen.  I will see her back in this clinic in 12 weeks or earlier if there is a problem.  Allena Katz, MD Allergy / Immunology [place]

## 2019-12-06 ENCOUNTER — Encounter: Payer: Self-pay | Admitting: Allergy and Immunology

## 2019-12-06 MED ORDER — FLUTICASONE PROPIONATE 50 MCG/ACT NA SUSP: NASAL | 5 refills | Status: DC

## 2019-12-06 MED ORDER — BREZTRI AEROSPHERE 160-9-4.8 MCG/ACT IN AERO: 2.0000 | INHALATION_SPRAY | Freq: Two times a day (BID) | RESPIRATORY_TRACT | 5 refills | Status: DC

## 2020-01-08 ENCOUNTER — Other Ambulatory Visit: Payer: Self-pay | Admitting: Allergy and Immunology

## 2020-01-09 ENCOUNTER — Telehealth: Payer: Self-pay | Admitting: *Deleted

## 2020-01-09 NOTE — Telephone Encounter (Signed)
A message was left, re: her follow up visit. 

## 2020-01-19 ENCOUNTER — Ambulatory Visit: Payer: 59 | Admitting: Cardiovascular Disease

## 2020-01-23 ENCOUNTER — Ambulatory Visit: Payer: 59 | Admitting: Cardiovascular Disease

## 2020-02-16 ENCOUNTER — Other Ambulatory Visit: Payer: Self-pay

## 2020-02-16 ENCOUNTER — Ambulatory Visit (INDEPENDENT_AMBULATORY_CARE_PROVIDER_SITE_OTHER): Payer: 59 | Admitting: Cardiovascular Disease

## 2020-02-16 ENCOUNTER — Encounter: Payer: Self-pay | Admitting: Cardiovascular Disease

## 2020-02-16 VITALS — BP 116/74 | HR 64 | Ht 65.0 in | Wt 162.2 lb

## 2020-02-16 DIAGNOSIS — I10 Essential (primary) hypertension: Secondary | ICD-10-CM | POA: Diagnosis not present

## 2020-02-16 DIAGNOSIS — I2510 Atherosclerotic heart disease of native coronary artery without angina pectoris: Secondary | ICD-10-CM

## 2020-02-16 DIAGNOSIS — E782 Mixed hyperlipidemia: Secondary | ICD-10-CM

## 2020-02-16 DIAGNOSIS — Z79899 Other long term (current) drug therapy: Secondary | ICD-10-CM | POA: Diagnosis not present

## 2020-02-16 LAB — LIPID PANEL
Chol/HDL Ratio: 2.6 ratio (ref 0.0–4.4)
Cholesterol, Total: 135 mg/dL (ref 100–199)
HDL: 52 mg/dL (ref >39)
LDL Chol Calc (NIH): 73 mg/dL (ref 0–99)
Triglycerides: 41 mg/dL (ref 0–149)
VLDL Cholesterol Cal: 10 mg/dL (ref 5–40)

## 2020-02-16 LAB — HEPATIC FUNCTION PANEL
ALT: 17 IU/L (ref 0–32)
AST: 25 IU/L (ref 0–40)
Albumin: 4.1 g/dL (ref 3.8–4.8)
Alkaline Phosphatase: 117 IU/L (ref 48–121)
Bilirubin Total: 0.3 mg/dL (ref 0.0–1.2)
Bilirubin, Direct: 0.12 mg/dL (ref 0.00–0.40)
Total Protein: 7.1 g/dL (ref 6.0–8.5)

## 2020-02-16 MED ORDER — ATORVASTATIN CALCIUM 40 MG PO TABS
40.0000 mg | ORAL_TABLET | Freq: Every day | ORAL | 3 refills | Status: DC
Start: 2020-02-16 — End: 2023-06-14

## 2020-02-16 NOTE — Progress Notes (Signed)
02/16/2020 [name]   04-07-1956  [id number]  Primary Physician [name], NP Primary Cardiologist: Lorretta Harp MD [name], [name]  HPI:  [name] is a 64 y.o.  fit appearing divorced African-American female mother of 2, grandmother of 56 grandchildren who works as an Librarian, academic at [employer]. She is currently working from home. She was referred by Dr. Lamonte Sakai for cardiovascular dilation because of coronary calcification found incidentally on chest CT performed 04/07/2019.  I last saw her in the office 07/19/2019.  Her risk factors include treated hypertension and hyperlipidemia. She is never smoked. There is no family history of heart disease. She is never had a heart attack or stroke. She denies chest pain or shortness of breath. She does have sarcoidosis which she has had since 1997. Her recent CT scan performed in August was unchanged from the one performed in January. She did have coronary calcification in the LAD and RCA seen on that CT scan however and was referred here for evaluation of this.  Based on her coronary calcification on chest CT I performed coronary calcium score on her 07/19/2019 which was 1178.  Based on this I increased her atorvastatin from 20 to 40 mg a day.  She is completely asymptomatic other than her baseline shortness of breath which she attributes to her sarcoidosis.  She does not smoke.   Current Meds  Medication Sig  . albuterol (VENTOLIN HFA) 108 (90 Base) MCG/ACT inhaler USE 1 INHALATION BY MOUTH  EVERY 4 HOURS AS NEEDED FOR WHEEZING OR SHORTNESS OF  BREATH.  Marland Kitchen aspirin 81 MG tablet Take 81 mg by mouth daily.  Marland Kitchen aspirin-acetaminophen-caffeine (EXCEDRIN MIGRAINE) 250-250-65 MG per tablet Take 1 tablet by mouth every 6 (six) hours as needed for pain.  Marland Kitchen atorvastatin (LIPITOR) 20 MG tablet Take 1 tablet (20 mg total) by mouth daily. (Patient taking differently: Take 40 mg by mouth daily. )  .  Budeson-Glycopyrrol-Formoterol (BREZTRI AEROSPHERE) 160-9-4.8 MCG/ACT AERO Inhale 2 puffs into the lungs in the morning and at bedtime.  . Cholecalciferol (VITAMIN D-3) 5000 UNITS TABS Take 5,000 Units by mouth daily.  . famotidine (PEPCID) 40 MG tablet Take 1 tablet (40 mg total) by mouth at bedtime.  . fenofibrate 160 MG tablet Take 160 mg by mouth daily.  . fluticasone (FLONASE) 50 MCG/ACT nasal spray USE 1 SPRAY INTO BOTH  NOSTRILS DAILY FOLLOWING NASAL WASH  . ibuprofen (ADVIL,MOTRIN) 200 MG tablet Take 400 mg by mouth every 6 (six) hours as needed.  . loratadine (CLARITIN) 10 MG tablet Take 1 tablet (10 mg total) by mouth daily.  Marland Kitchen losartan-hydrochlorothiazide (HYZAAR) 50-12.5 MG per tablet Take 1 tablet by mouth daily.  . metoCLOPramide (REGLAN) 10 MG tablet Take 10 mg by mouth 4 (four) times daily.  . Multiple Vitamin (MULTIVITAMIN) capsule Take 1 capsule by mouth daily.    . Naproxen Sod-Diphenhydramine (ALEVE PM PO) Take 1 tablet by mouth at bedtime as needed and may repeat dose one time if needed.  . niacin (NIASPAN) 1000 MG CR tablet Take 1,000 mg by mouth at bedtime.  . pantoprazole (PROTONIX) 40 MG tablet TAKE 1 TABLET BY MOUTH  TWICE DAILY  . predniSONE (DELTASONE) 5 MG tablet TAKE 1 TABLET BY MOUTH  DAILY WITH BREAKFAST  . SYMBICORT 160-4.5 MCG/ACT inhaler USE 2 PUFFS BY MOUTH TWO  TIMES DAILY (Patient taking differently: Inhale 2 puffs into the lungs daily. )  . [DISCONTINUED] dexlansoprazole (DEXILANT) 60 MG capsule  Take 1 capsule (60 mg total) by mouth 2 (two) times daily.  . [DISCONTINUED] dexlansoprazole (DEXILANT) 60 MG capsule Take 1 capsule (60 mg total) by mouth 2 (two) times daily.     No Known Allergies  Social History   Socioeconomic History  . Marital status: Divorced    Spouse name: Not on file  . Number of children: 2  . Years of education: college  . Highest education level: Not on file  Occupational History  . Occupation: CONSULTANT    Employer: [employer]  Tobacco Use  . Smoking status: Never Smoker  . Smokeless tobacco: Never Used  Vaping Use  . Vaping Use: Never used  Substance and Sexual Activity  . Alcohol use: No  . Drug use: No  . Sexual activity: Not Currently    Partners: Male  Other Topics Concern  . Not on file  Social History Narrative   Patient lives at home alone.  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 Difficulty of Paying Living Expenses:   Food Insecurity:   . Worried About Charity fundraiser in the Last Year:   . Arboriculturist in the Last Year:   Transportation Needs:   . Film/video editor (Medical):   Marland Kitchen Lack of Transportation (Non-Medical):   Physical Activity:   . Days of Exercise per Week:   . Minutes of Exercise per Session:   Stress:   . Feeling of Stress :   Social Connections:   . Frequency of Communication with Friends and Family:   . Frequency of Social Gatherings with Friends and Family:   . Attends Religious Services:   . Active Member of Clubs or Organizations:   . Attends Archivist Meetings:   Marland Kitchen Marital Status:   Intimate Partner Violence:   . Fear of Current or Ex-Partner:   . Emotionally Abused:   Marland Kitchen Physically Abused:   . Sexually Abused:      Review of Systems: General: negative for chills, fever, night sweats or weight changes.  Cardiovascular: negative for chest pain, dyspnea on exertion, edema, orthopnea, palpitations, paroxysmal nocturnal dyspnea or shortness of breath Dermatological: negative for rash Respiratory: negative for cough or wheezing Urologic: negative for hematuria Abdominal: negative for nausea, vomiting, diarrhea, bright red blood per rectum, melena, or hematemesis Neurologic: negative for visual changes, syncope, or dizziness All other systems  reviewed and are otherwise negative except as noted above.    Blood pressure 116/74, pulse 64, height 5\' 5"  (1.651 m), weight 162 lb 3.2 oz (73.6 kg).  General appearance: alert and no distress Neck: no adenopathy, no carotid bruit, no JVD, supple, symmetrical, trachea midline and thyroid not enlarged, symmetric, no tenderness/mass/nodules Lungs: clear to auscultation bilaterally Heart: regular rate and rhythm, S1, S2 normal, no murmur, click, rub or gallop Extremities: extremities normal, atraumatic, no cyanosis or edema Pulses: 2+ and symmetric Skin: Skin color, texture, turgor normal. No rashes or lesions Neurologic: Alert and oriented X 3, normal strength and tone. Normal symmetric reflexes. Normal coordination and gait  EKG sinus rhythm at 64 without ST or T wave changes.  I personally reviewed this EKG.  ASSESSMENT AND PLAN:   Essential hypertension History of essential hypertension blood pressure measured today 116/74.  She is on losartan and hydrochlorothiazide.  Hyperlipidemia History of hyperlipidemia with lipid profile performed 07/19/2019 revealing total cholesterol of 169.  She was on atorvastatin 20 mg a day which is result of her elevated coronary calcium score was increased to 40 mg a day.  We will recheck a lipid liver profile this morning  Coronary artery calcification seen on CT scan Coronary calcium score performed 07/19/2019 was 1178.  Based on this I did increase her atorvastatin from 20 to 40 mg a day and we will recheck lipid liver profile with goal of LDL less than 70 for secondary prevention.  She is asymptomatic.      Lorretta Harp MD FACP,FACC,FAHA, [facility] 02/16/2020 9:29 AM

## 2020-02-16 NOTE — Assessment & Plan Note (Signed)
History of essential hypertension blood pressure measured today 116/74.  She is on losartan and hydrochlorothiazide.

## 2020-02-16 NOTE — Assessment & Plan Note (Signed)
History of hyperlipidemia with lipid profile performed 07/19/2019 revealing total cholesterol of 169.  She was on atorvastatin 20 mg a day which is result of her elevated coronary calcium score was increased to 40 mg a day.  We will recheck a lipid liver profile this morning

## 2020-02-16 NOTE — Assessment & Plan Note (Signed)
Coronary calcium score performed 07/19/2019 was 1178.  Based on this I did increase her atorvastatin from 20 to 40 mg a day and we will recheck lipid liver profile with goal of LDL less than 70 for secondary prevention.  She is asymptomatic.

## 2020-02-16 NOTE — Patient Instructions (Signed)
Medication Instructions:  Your Physician recommend you continue on your current medication as directed.    *If you need a refill on your cardiac medications before your next appointment, please call your pharmacy*   Lab Work: Your physician recommends that you return for lab work today ( Lipid, LFT)  If you have labs (blood work) drawn today and your tests are completely normal, you will receive your results only by: Marland Kitchen MyChart Message (if you have MyChart) OR . A paper copy in the mail If you have any lab test that is abnormal or we need to change your treatment, we will call you to review the results.   Testing/Procedures: None   Follow-Up: At [facility], you and your health needs are our priority.  As part of our continuing mission to provide you with exceptional heart care, we have created designated Provider Care Teams.  These Care Teams include your primary Cardiologist (physician) and Advanced Practice Providers (APPs -  Physician Assistants and Nurse Practitioners) who all work together to provide you with the care you need, when you need it.  We recommend signing up for the patient portal called "MyChart".  Sign up information is provided on this After Visit Summary.  MyChart is used to connect with patients for Virtual Visits (Telemedicine).  Patients are able to view lab/test results, encounter notes, upcoming appointments, etc.  Non-urgent messages can be sent to your provider as well.   To learn more about what you can do with MyChart, go to [email or web address].    Your next appointment:   1 year(s)  The format for your next appointment:   In Person  Provider:   Quay Burow, MD   Heart-Healthy Eating Plan Heart-healthy meal planning includes:  Eating less unhealthy fats.  Eating more healthy fats.  Making other changes in your diet. Talk with your doctor or a diet specialist (dietitian) to create an eating plan that is right for you.  What are tips  for following this plan? Cooking Avoid frying your food. Try to bake, boil, grill, or broil it instead. You can also reduce fat by:  Removing the skin from poultry.  Removing all visible fats from meats.  Steaming vegetables in water or broth. Meal planning   At meals, divide your plate into four equal parts: ? Fill one-half of your plate with vegetables and green salads. ? Fill one-fourth of your plate with whole grains. ? Fill one-fourth of your plate with lean protein foods.  Eat 4-5 servings of vegetables per day. A serving of vegetables is: ? 1 cup of raw or cooked vegetables. ? 2 cups of raw leafy greens.  Eat 4-5 servings of fruit per day. A serving of fruit is: ? 1 medium whole fruit. ?  cup of dried fruit. ?  cup of fresh, frozen, or canned fruit. ?  cup of 100% fruit juice.  Eat more foods that have soluble fiber. These are apples, broccoli, carrots, beans, peas, and barley. Try to get 20-30 g of fiber per day.  Eat 4-5 servings of nuts, legumes, and seeds per week: ? 1 serving of dried beans or legumes equals  cup after being cooked. ? 1 serving of nuts is  cup. ? 1 serving of seeds equals 1 tablespoon. General information  Eat more home-cooked food. Eat less restaurant, buffet, and fast food.  Limit or avoid alcohol.  Limit foods that are high in starch and sugar.  Avoid fried foods.  Lose weight if you are  overweight.  Keep track of how much salt (sodium) you eat. This is important if you have high blood pressure. Ask your doctor to tell you more about this.  Try to add vegetarian meals each week. Fats  Choose healthy fats. These include olive oil and canola oil, flaxseeds, walnuts, almonds, and seeds.  Eat more omega-3 fats. These include salmon, mackerel, sardines, tuna, flaxseed oil, and ground flaxseeds. Try to eat fish at least 2 times each week.  Check food labels. Avoid foods with trans fats or high amounts of saturated fat.  Limit  saturated fats. ? These are often found in animal products, such as meats, butter, and cream. ? These are also found in plant foods, such as palm oil, palm kernel oil, and coconut oil.  Avoid foods with partially hydrogenated oils in them. These have trans fats. Examples are stick margarine, some tub margarines, cookies, crackers, and other baked goods. What foods can I eat? Fruits All fresh, canned (in natural juice), or frozen fruits. Vegetables Fresh or frozen vegetables (raw, steamed, roasted, or grilled). Green salads. Grains Most grains. Choose whole wheat and whole grains most of the time. Rice and pasta, including brown rice and pastas made with whole wheat. Meats and other proteins Lean, well-trimmed beef, veal, pork, and lamb. Chicken and [place] without skin. All fish and shellfish. Wild duck, rabbit, pheasant, and venison. Egg whites or low-cholesterol egg substitutes. Dried beans, peas, lentils, and tofu. Seeds and most nuts. Dairy Low-fat or nonfat cheeses, including ricotta and mozzarella. Skim or 1% milk that is liquid, powdered, or evaporated. Buttermilk that is made with low-fat milk. Nonfat or low-fat yogurt. Fats and oils Non-hydrogenated (trans-free) margarines. Vegetable oils, including soybean, sesame, sunflower, olive, peanut, safflower, corn, canola, and cottonseed. Salad dressings or mayonnaise made with a vegetable oil. Beverages Mineral water. Coffee and tea. Diet carbonated beverages. Sweets and desserts Sherbet, gelatin, and fruit ice. Small amounts of dark chocolate. Limit all sweets and desserts. Seasonings and condiments All seasonings and condiments. The items listed above may not be a complete list of foods and drinks you can eat. Contact a dietitian for more options. What foods should I avoid? Fruits Canned fruit in heavy syrup. Fruit in cream or butter sauce. Fried fruit. Limit coconut. Vegetables Vegetables cooked in cheese, cream, or butter sauce.  Fried vegetables. Grains Breads that are made with saturated or trans fats, oils, or whole milk. Croissants. Sweet rolls. Donuts. High-fat crackers, such as cheese crackers. Meats and other proteins Fatty meats, such as hot dogs, ribs, sausage, bacon, rib-eye roast or steak. High-fat deli meats, such as salami and bologna. Caviar. Domestic duck and goose. Organ meats, such as liver. Dairy Cream, sour cream, cream cheese, and creamed cottage cheese. Whole-milk cheeses. Whole or 2% milk that is liquid, evaporated, or condensed. Whole buttermilk. Cream sauce or high-fat cheese sauce. Yogurt that is made from whole milk. Fats and oils Meat fat, or shortening. Cocoa butter, hydrogenated oils, palm oil, coconut oil, palm kernel oil. Solid fats and shortenings, including bacon fat, salt pork, lard, and butter. Nondairy cream substitutes. Salad dressings with cheese or sour cream. Beverages Regular sodas and juice drinks with added sugar. Sweets and desserts Frosting. Pudding. Cookies. Cakes. Pies. Milk chocolate or white chocolate. Buttered syrups. Full-fat ice cream or ice cream drinks. The items listed above may not be a complete list of foods and drinks to avoid. Contact a dietitian for more information. Summary  Heart-healthy meal planning includes eating less unhealthy fats, eating  more healthy fats, and making other changes in your diet.  Eat a balanced diet. This includes fruits and vegetables, low-fat or nonfat dairy, lean protein, nuts and legumes, whole grains, and heart-healthy oils and fats. This information is not intended to replace advice given to you by your health care provider. Make sure you discuss any questions you have with your health care provider. Document Revised: 10/14/2017 Document Reviewed: 09/17/2017 Elsevier Patient Education  2020 [employer].

## 2020-02-27 ENCOUNTER — Encounter: Payer: Self-pay | Admitting: Allergy and Immunology

## 2020-02-27 ENCOUNTER — Ambulatory Visit (INDEPENDENT_AMBULATORY_CARE_PROVIDER_SITE_OTHER): Payer: 59 | Admitting: Allergy and Immunology

## 2020-02-27 ENCOUNTER — Other Ambulatory Visit: Payer: Self-pay

## 2020-02-27 VITALS — BP 130/72 | HR 63 | Temp 97.9°F | Resp 16 | Ht 64.0 in | Wt 167.0 lb

## 2020-02-27 DIAGNOSIS — J4540 Moderate persistent asthma, uncomplicated: Secondary | ICD-10-CM | POA: Diagnosis not present

## 2020-02-27 DIAGNOSIS — J3089 Other allergic rhinitis: Secondary | ICD-10-CM

## 2020-02-27 DIAGNOSIS — K219 Gastro-esophageal reflux disease without esophagitis: Secondary | ICD-10-CM

## 2020-02-27 DIAGNOSIS — J849 Interstitial pulmonary disease, unspecified: Secondary | ICD-10-CM | POA: Diagnosis not present

## 2020-02-27 DIAGNOSIS — D860 Sarcoidosis of lung: Secondary | ICD-10-CM | POA: Diagnosis not present

## 2020-02-27 DIAGNOSIS — D869 Sarcoidosis, unspecified: Secondary | ICD-10-CM

## 2020-02-27 NOTE — Patient Instructions (Addendum)
  1.  Allergen avoidance measures - house dust mite  2.  Continue to treat inflammation of airway:   A.  Breztri - 2 inhalations 2 times a day   B.  Flonase 1-2 sprays each nostril once a day following nasal wash  3.  Continue to treat reflux:   A.  Eliminate use of fish oil, caffeine, chocolate  B.  Raise head of bed  C.  No late meals  D.  Pantoprazole 40 mg twice a day  E.  Famotidine 40 mg in evening  F.  Metoclopramide 10 mg 2 times per day  4.  If needed:   A.  OTC antihistamine   B.  Albuterol HFA-2 inhalations once a day  5. Obtain HRCT of chest for sarcoidosis / ILD  6. Revisit with Dr. Lamonte Sakai for sarcoidosis / ILD  7. Plan for fall flu vaccine  8. Return to clinic in 6 months or earlier if problem

## 2020-02-27 NOTE — Progress Notes (Signed)
[place]   Follow-up Note  Referring Provider: [name], NP Primary Provider: [name], NP Date of Office Visit: 02/27/2020  Subjective:   [name] (DOB: 10-16-1955) is a 64 y.o. female who returns to the [facility] [place] on 02/27/2020 in re-evaluation of the following:  HPI: [name] returns to this clinic in reevaluation of cough with contributions from asthma and LPR and sarcoidosis with interstitial lung disease. Her last visit to this clinic was 05 December 2019.  As noted during her last visit her cough had improved significantly as did her throat clearing but she still continues to have cough and she still has a little bit of throat clearing.  She has not been having any regurgitation of posttussive emesis.  Sometimes her voice is a little raspy but that has improved as well.  She has no upper airway symptoms.  She has no classic reflux symptoms.  Allergies as of 02/27/2020   No Known Allergies     Medication List      albuterol 108 (90 Base) MCG/ACT inhaler Commonly known as: VENTOLIN HFA USE 1 INHALATION BY MOUTH  EVERY 4 HOURS AS NEEDED FOR WHEEZING OR SHORTNESS OF  BREATH.   ALEVE PM PO Take 1 tablet by mouth at bedtime as needed and may repeat dose one time if needed.   aspirin 81 MG tablet Take 81 mg by mouth daily.   aspirin-acetaminophen-caffeine 250-250-65 MG tablet Commonly known as: EXCEDRIN MIGRAINE Take 1 tablet by mouth every 6 (six) hours as needed for pain.   atorvastatin 40 MG tablet Commonly known as: LIPITOR Take 1 tablet (40 mg total) by mouth daily.   Breztri Aer[place] 160-9-4.8 MCG/ACT Aero Generic drug: Budeson-Glycopyrrol-Formoterol Inhale 2 puffs into the lungs in the morning and at bedtime.   famotidine 40 MG tablet Commonly known as: PEPCID Take 1 tablet (40 mg total) by mouth at bedtime.   fenofibrate 160 MG tablet Take 160 mg by mouth daily.     fluticasone 50 MCG/ACT nasal spray Commonly known as: FLONASE USE 1 SPRAY INTO BOTH  NOSTRILS DAILY FOLLOWING NASAL WASH   ibuprofen 200 MG tablet Commonly known as: ADVIL Take 400 mg by mouth every 6 (six) hours as needed.   loratadine 10 MG tablet Commonly known as: CLARITIN Take 1 tablet (10 mg total) by mouth daily.   losartan-hydrochlorothiazide 50-12.5 MG tablet Commonly known as: HYZAAR Take 1 tablet by mouth daily.   metoCLOPramide 10 MG tablet Commonly known as: REGLAN Take 10 mg by mouth 4 (four) times daily.   multivitamin capsule Take 1 capsule by mouth daily.   niacin 1000 MG CR tablet Commonly known as: NIASPAN Take 1,000 mg by mouth at bedtime.   pantoprazole 40 MG tablet Commonly known as: PROTONIX TAKE 1 TABLET BY MOUTH  TWICE DAILY   predniSONE 5 MG tablet Commonly known as: DELTASONE TAKE 1 TABLET BY MOUTH  DAILY WITH BREAKFAST   Symbicort 160-4.5 MCG/ACT inhaler Generic drug: budesonide-formoterol USE 2 PUFFS BY MOUTH TWO  TIMES DAILY What changed: See the new instructions.   Vitamin D-3 125 MCG (5000 UT) Tabs Take 5,000 Units by mouth daily.       Past Medical History:  Diagnosis Date  . Angio-edema   . Chronic sinusitis   . Cough   . Diabetes mellitus without complication ([place])   . Diverticulosis 2009   mild sigmoid  . High cholesterol   . Hypertension   .  Recurrent upper respiratory infection (URI)   . Sarcoidosis   . Vitamin D deficiency     Past Surgical History:  Procedure Laterality Date  . COLONOSCOPY    . LYMPHADENECTOMY    . NOSE SURGERY    . SINOSCOPY    . TUBAL LIGATION    . VESICOVAGINAL FISTULA CLOSURE W/ TAH    . VIDEO BRONCHOSCOPY Bilateral 07/29/2017   Procedure: VIDEO BRONCHOSCOPY WITH FLUORO;  Surgeon: Collene Gobble, MD;  Location: [facility];  Service: Cardiopulmonary;  Laterality: Bilateral;    Review of systems negative except as noted in HPI / PMHx or noted below:  Review of Systems   Constitutional: Negative.   HENT: Negative.   Eyes: Negative.   Respiratory: Negative.   Cardiovascular: Negative.   Gastrointestinal: Negative.   Genitourinary: Negative.   Musculoskeletal: Negative.   Skin: Negative.   Neurological: Negative.   Endo/Heme/Allergies: Negative.   Psychiatric/Behavioral: Negative.      Objective:   Vitals:   02/27/20 1639  BP: 130/72  Pulse: 63  Resp: 16  Temp: 97.9 F (36.6 C)  SpO2: 96%   Height: 5\' 4"  (162.6 cm)  Weight: 167 lb (75.8 kg)   Physical Exam Constitutional:      Appearance: She is not diaphoretic.     Comments: Cough with inspiratory effort  HENT:     Head: Normocephalic.     Right Ear: Tympanic membrane, ear canal and external ear normal.     Left Ear: Tympanic membrane, ear canal and external ear normal.     Nose: Nose normal. No mucosal edema or rhinorrhea.     Mouth/Throat:     Pharynx: Uvula midline. No oropharyngeal exudate.  Eyes:     Conjunctiva/sclera: Conjunctivae normal.  Neck:     Thyroid: No thyromegaly.     Trachea: Trachea normal. No tracheal tenderness or tracheal deviation.  Cardiovascular:     Rate and Rhythm: Normal rate and regular rhythm.     Heart sounds: Normal heart sounds, S1 normal and S2 normal. No murmur heard.   Pulmonary:     Effort: No respiratory distress.     Breath sounds: Normal breath sounds. No stridor. No wheezing (Inspiratory crackles posterior lung fields bilaterally) or rales.  Lymphadenopathy:     Head:     Right side of head: No tonsillar adenopathy.     Left side of head: No tonsillar adenopathy.     Cervical: No cervical adenopathy.  Skin:    Findings: No erythema or rash.     Nails: There is no clubbing.  Neurological:     Mental Status: She is alert.     Diagnostics:    Spirometry was performed and demonstrated an FEV1 of 1.55 at 75 % of predicted.  The patient had an Asthma Control Test with the following results: ACT Total Score: 24.    Assessment  and Plan:   1. Asthma, moderate persistent, well-controlled   2. Sarcoidosis of lung ([place])   3. Interstitial lung disease ([place])   4. Perennial allergic rhinitis   5. LPRD (laryngopharyngeal reflux disease)   6. Sarcoidosis     1.  Allergen avoidance measures - house dust mite  2.  Continue to treat inflammation of airway:   A.  Breztri - 2 inhalations 2 times a day   B.  Flonase 1-2 sprays each nostril once a day following nasal wash  3.  Continue to treat reflux:   A.  Eliminate use of fish oil,  caffeine, chocolate  B.  Raise head of bed  C.  No late meals  D.  Pantoprazole 40 mg twice a day  E.  Famotidine 40 mg in evening  F.  Metoclopramide 10 mg 2 times per day  4.  If needed:   A.  OTC antihistamine   B.  Albuterol HFA-2 inhalations once a day  5. Obtain HRCT of chest for sarcoidosis / ILD  6. Revisit with Dr. Lamonte Sakai for sarcoidosis / ILD  7. Plan for fall flu vaccine  8. Return to clinic in 6 months or earlier if problem  I believe that [name] has had maximal benefit from therapy directed against respiratory tract inflammation and reflux with the very aggressive plan noted above.  She still coughs and she has evidence of significant lung damage on physical exam with her inspiratory crackling and I think it is time to repeat her high-resolution chest CT scan.  Her last scan was 2020.  We will also refer her back to Dr. Lamonte Sakai once the results of that scan is available for review and consideration of therapy for sarcoidosis and interstitial lung disease.  If Dr. Lamonte Sakai believes that her interstitial lung disease and sarcoidosis is a stable issue and not a major contributor to her respiratory tract symptoms then we can consider treating her inflammation more aggressively and possibly consider giving her an anti-IL-5 agent as she did have documented eosinophilia over the course of the past several years.  Allena Katz, MD Allergy / Immunology [place]

## 2020-02-28 ENCOUNTER — Encounter: Payer: Self-pay | Admitting: Allergy and Immunology

## 2020-03-21 ENCOUNTER — Telehealth: Payer: Self-pay

## 2020-03-21 NOTE — Telephone Encounter (Signed)
PA for CT chest was approved through [facility] and authorization number is [id number]. CT imaging can be performed form 02/20/2020 to 05/05/2020

## 2020-03-21 NOTE — Telephone Encounter (Signed)
Will need to contact insurance at [phone]

## 2020-03-21 NOTE — Telephone Encounter (Signed)
[facility] called stating a PA is needed for patients CT scan that is scheduled for tomorrow. They mentioned they would leave the procedure scheduled for now until 2pm, but if no PA was able to be completed by then, the CT would need to be rescheduled.

## 2020-03-21 NOTE — Telephone Encounter (Signed)
PA for CT CXR is being processed through [facility] @ [phone]

## 2020-03-22 ENCOUNTER — Ambulatory Visit
Admission: RE | Admit: 2020-03-22 | Discharge: 2020-03-22 | Disposition: A | Discharge status: Home / Self Care | Payer: 59 | Source: Ambulatory Visit | Attending: Allergy and Immunology | Admitting: Allergy and Immunology

## 2020-03-22 ENCOUNTER — Other Ambulatory Visit: Payer: Self-pay

## 2020-04-04 ENCOUNTER — Encounter: Payer: Self-pay | Admitting: Emergency Medicine

## 2020-04-04 ENCOUNTER — Other Ambulatory Visit: Payer: Self-pay

## 2020-04-04 ENCOUNTER — Ambulatory Visit (INDEPENDENT_AMBULATORY_CARE_PROVIDER_SITE_OTHER): Payer: 59 | Admitting: Emergency Medicine

## 2020-04-04 DIAGNOSIS — R05 Cough: Secondary | ICD-10-CM | POA: Diagnosis not present

## 2020-04-04 DIAGNOSIS — D869 Sarcoidosis, unspecified: Secondary | ICD-10-CM

## 2020-04-04 DIAGNOSIS — R053 Chronic cough: Secondary | ICD-10-CM

## 2020-04-04 MED ORDER — LEVOFLOXACIN 500 MG PO TABS
500.0000 mg | ORAL_TABLET | Freq: Every day | ORAL | 0 refills | Status: DC
Start: 2020-04-04 — End: 2020-05-17

## 2020-04-04 NOTE — Patient Instructions (Signed)
Please take Levaquin 500 mg daily for the next 7 days Continue your Breztri 2 puffs twice a day.  Rinse and gargle after using. Keep your albuterol available to use 2 puffs if you did or shortness of breath, chest tightness, wheezing. Continue nasal saline rinses as you have been using them Continue fluticasone nasal spray as you have been doing Continue pantoprazole 40 mg twice a day Continue famotidine as you have been taking it We talked today about possibly repeating your bronchoscopy to evaluate for opportunistic infection or fungal infection. Alternatively we also discussed restarting prednisone to treat suspected active sarcoidosis. We will revisit these issues at your follow-up and decide whether to pursue either bronchoscopy or steroid treatment. Follow with Dr Lamonte Sakai in 1 month

## 2020-04-04 NOTE — Progress Notes (Signed)
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 [name]), obstructive lung disease due to her sarcoid and also allergic component (sees Dr. Neldon Mc).  Most recent IgE 66 with a normal eosinophil count, sensitivity to dust mites.  She is also treated for GERD with pantoprozole and famotidine. She is doing [place] flonase. She uses albuterol about 1x a day. She was changed from Symbicort to [place] - she isn't sure that it has changed anything. She is coughing up yellow phlegm  She underwent bronchoscopy 07/29/2017 to evaluate for any evidence of invasive aspergillosis, ABPA, fungal studies negative at that time.  She has been on prednisone, methotrexate in the past, tolerated both poorly.  A high-resolution CT scan of the chest was performed on 03/22/2020 which I have reviewed, compared with her prior 04/07/2019.  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  There appears to be some interval increase and groundglass activity in the posterior left upper lobe and lateral segment of the right middle lobe compared with prior.   She has a grandson that she keeps, is around non-vaccinated people.     EXAM :  Vitals:   04/04/20 1155  BP: 118/72  Pulse: 71  Temp: (!) 97.4 F (36.3 C)  TempSrc: Temporal  SpO2: 99%  Weight: 158 lb 9.6 oz (71.9 kg)  Height: 5\' 5"  (1.651 m)   Gen: Pleasant, well-nourished, in no distress,  normal affect  ENT: No lesions,  mouth clear,  oropharynx clear, no postnasal drip, some hoarse voice today  Neck: No JVD, no stridor  Lungs: No use of accessory muscles, bilateral inspiratory crackles best heard at the bases  Cardiovascular: RRR, heart  sounds normal, no murmur or gallops, no peripheral edema  Musculoskeletal: No deformities, no cyanosis or clubbing  Neuro: alert, non focal  Skin: Warm, no lesions or rash   Chronic cough Given her hoarse voice, minimally productive cough this sounds principally like upper airway irritation.  She is being treated aggressively for GERD and for allergic rhinitis.  Also for obstructive disease-her Symbicort was recently changed to [place].  Most recent measurements of her eosinophil count and IgE have been normal although she did have relative eosinophilia on a measurement a few years ago.  Certainly must consider lower airways process especially with infiltrates on chest CT as discussed below, known fungal ball and potential for ABPA.  Plan to continue her current aggressive therapy, no role for immunotherapy.  I do not see a real role at this juncture for targeted IL-5 therapy.  She does have some purulent sputum currently and I will treat her empirically for possible bacterial bronchitis although suspect that her long standing cough is more complicated as above.  Sarcoidosis Stage IV disease with significant parenchymal destruction and scar, base predominant ILD and an almost UIP pattern.  I suspect that the groundglass infiltrates in her right middle lobe, left upper lobe reflect active sarcoid.  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  The last time we were faced with this dilemma she underwent bronchoscopy in 2018, was negative for fungal elements or AFB, was treated with prednisone.  We are in the same situation now.  I talked to her about the  pros and cons of bronchoscopy versus empiric prednisone with close follow-up.  In the end I suspect she will need prednisone or a steroid sparing agent long-term but she has tolerated poorly.  She wants to consider the options, is understandably concerned about being immunosuppressed in the age of  Covid, delta variant.  We will discuss further next time  Baltazar Apo, MD, PhD 04/04/2020, 1:34 PM [facility] [phone] or if no answer [phone]

## 2020-04-04 NOTE — Assessment & Plan Note (Addendum)
Given her hoarse voice, minimally productive cough this sounds principally like upper airway irritation.  She is being treated aggressively for GERD and for allergic rhinitis.  Also for obstructive disease-her Symbicort was recently changed to [place].  Most recent measurements of her eosinophil count and IgE have been normal although she did have relative eosinophilia on a measurement a few years ago.  Certainly must consider lower airways process especially with infiltrates on chest CT as discussed below, known fungal ball and potential for ABPA.  Plan to continue her current aggressive therapy, no role for immunotherapy.  I do not see a real role at this juncture for targeted IL-5 therapy.  She does have some purulent sputum currently and I will treat her empirically for possible bacterial bronchitis although suspect that her long standing cough is more complicated as above.

## 2020-04-04 NOTE — Assessment & Plan Note (Signed)
Stage IV disease with significant parenchymal destruction and scar, base predominant ILD and an almost UIP pattern.  I suspect that the groundglass infiltrates in her right middle lobe, left upper lobe reflect active sarcoid.  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  The last time we were faced with this dilemma she underwent bronchoscopy in 2018, was negative for fungal elements or AFB, was treated with prednisone.  We are in the same situation now.  I talked to her about the pros and cons of bronchoscopy versus empiric prednisone with close follow-up.  In the end I suspect she will need prednisone or a steroid sparing agent long-term but she has tolerated poorly.  She wants to consider the options, is understandably concerned about being immunosuppressed in the age of Covid, delta variant.  We will discuss further next time

## 2020-05-17 ENCOUNTER — Ambulatory Visit (INDEPENDENT_AMBULATORY_CARE_PROVIDER_SITE_OTHER): Payer: 59 | Admitting: Emergency Medicine

## 2020-05-17 ENCOUNTER — Other Ambulatory Visit: Payer: Self-pay

## 2020-05-17 ENCOUNTER — Encounter: Payer: Self-pay | Admitting: Emergency Medicine

## 2020-05-17 VITALS — BP 110/68 | HR 71 | Temp 97.8°F | Ht 65.0 in | Wt 161.6 lb

## 2020-05-17 DIAGNOSIS — R053 Chronic cough: Secondary | ICD-10-CM

## 2020-05-17 DIAGNOSIS — D869 Sarcoidosis, unspecified: Secondary | ICD-10-CM

## 2020-05-17 DIAGNOSIS — Z23 Encounter for immunization: Secondary | ICD-10-CM | POA: Diagnosis not present

## 2020-05-17 DIAGNOSIS — R05 Cough: Secondary | ICD-10-CM

## 2020-05-17 NOTE — Addendum Note (Signed)
Addended by: Gavin Potters R on: 05/17/2020 05:00 PM   Modules accepted: Orders

## 2020-05-17 NOTE — Assessment & Plan Note (Signed)
Continue Protonix and Pepcid Continue loratadine and fluticasone nasal spray

## 2020-05-17 NOTE — Progress Notes (Signed)
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 [name]), obstructive lung disease due to her sarcoid and also allergic component (sees Dr. Neldon Mc).  Most recent IgE 66 with a normal eosinophil count, sensitivity to dust mites.  She is also treated for GERD with pantoprozole and famotidine. She is doing [place] flonase. She uses albuterol about 1x a day. She was changed from Symbicort to [place] - she isn't sure that it has changed anything. She is coughing up yellow phlegm  She underwent bronchoscopy 07/29/2017 to evaluate for any evidence of invasive aspergillosis, ABPA, fungal studies negative at that time.  She has been on prednisone, methotrexate in the past, tolerated both poorly.  A high-resolution CT scan of the chest was performed on 03/22/2020 which I have reviewed, compared with her prior 04/07/2019.  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  There appears to be some interval increase and groundglass activity in the posterior left upper lobe and lateral segment of the right middle lobe compared with prior.   She has a grandson that she keeps, is around non-vaccinated people.   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  She also has a history of chronic cough in the setting of significant allergic rhinitis, GERD.  When I last saw her she was having more cough.  She has increased groundglass on her most recent CT 03/22/2020 and we discussed possibly repeating her bronchoscopy or treating with prednisone.  In the  end we decided to hold off and follow clinically.  I did treat her with Levaquin for 7 days for possible bronchitis/sinusitis.  Today she reports that her cough and mucous did improve. Now back to her baseline cough. She remains on Breztri, isn't sure its any better than Symbicort. Minimal albuterol use.  Remains on protonix, loratadine, pepcid, flonase     EXAM :  Vitals:   05/17/20 1558  BP: 110/68  Pulse: 71  Temp: 97.8 F (36.6 C)  TempSrc: Temporal  SpO2: 97%  Weight: 161 lb 9.6 oz (73.3 kg)  Height: 5\' 5"  (1.651 m)   Gen: Pleasant, well-nourished, in no distress,  normal affect  ENT: No lesions,  mouth clear,  oropharynx clear, no postnasal drip, some hoarse voice today  Neck: No JVD, no stridor  Lungs: No use of accessory muscles, bilateral inspiratory crackles best heard at the bases  Cardiovascular: RRR, heart sounds normal, no murmur or gallops, no peripheral edema  Musculoskeletal: No deformities, no cyanosis or clubbing  Neuro: alert, non focal  Skin: Warm, no lesions or rash   Sarcoidosis Continue Breztri for now.  Check into the cost of this medication.  If cost effective then we could change back to Symbicort 2 puffs twice a day. Keep albuterol available use 2 puffs if needed for shortness of breath, chest tightness, wheezing, coughing We will repeat your CT scan of the chest without contrast to follow your sarcoidosis in December 2021 Flu shot today Would work on getting your COVID-19 booster vaccine in November Follow with Dr. Lamonte Sakai in December after your CT scan to review the results together.  Chronic cough  Continue Protonix and Pepcid Continue loratadine and fluticasone nasal spray  Baltazar Apo, MD, PhD 05/17/2020, 4:17 PM [facility] [phone] or if no answer [phone]

## 2020-05-17 NOTE — Patient Instructions (Addendum)
Continue Breztri for now.  Check into the cost of this medication.  If cost effective then we could change back to Symbicort 2 puffs twice a day. Keep albuterol available use 2 puffs if needed for shortness of breath, chest tightness, wheezing, coughing We will repeat your CT scan of the chest without contrast to follow your sarcoidosis in December 2021 Continue Protonix and Pepcid Continue loratadine and fluticasone nasal spray Flu shot today Would work on getting your COVID-19 booster vaccine in November Follow with Dr. Lamonte Sakai in December after your CT scan to review the results together.

## 2020-05-17 NOTE — Assessment & Plan Note (Signed)
Continue Breztri for now.  Check into the cost of this medication.  If cost effective then we could change back to Symbicort 2 puffs twice a day. Keep albuterol available use 2 puffs if needed for shortness of breath, chest tightness, wheezing, coughing We will repeat your CT scan of the chest without contrast to follow your sarcoidosis in December 2021 Flu shot today Would work on getting your COVID-19 booster vaccine in November Follow with Dr. Lamonte Sakai in December after your CT scan to review the results together.

## 2020-06-01 ENCOUNTER — Encounter: Payer: Self-pay | Admitting: Emergency Medicine

## 2020-07-22 ENCOUNTER — Other Ambulatory Visit: Payer: Self-pay | Admitting: Emergency Medicine

## 2020-08-01 ENCOUNTER — Other Ambulatory Visit: Payer: Self-pay

## 2020-08-01 ENCOUNTER — Ambulatory Visit (INDEPENDENT_AMBULATORY_CARE_PROVIDER_SITE_OTHER)
Admission: RE | Admit: 2020-08-01 | Discharge: 2020-08-01 | Disposition: A | Discharge status: Home / Self Care | Payer: 59 | Source: Ambulatory Visit | Attending: Emergency Medicine | Admitting: Emergency Medicine

## 2020-08-01 DIAGNOSIS — D869 Sarcoidosis, unspecified: Secondary | ICD-10-CM

## 2020-08-05 ENCOUNTER — Other Ambulatory Visit: Payer: Self-pay

## 2020-08-05 ENCOUNTER — Ambulatory Visit (INDEPENDENT_AMBULATORY_CARE_PROVIDER_SITE_OTHER): Payer: 59 | Admitting: Emergency Medicine

## 2020-08-05 ENCOUNTER — Encounter: Payer: Self-pay | Admitting: Emergency Medicine

## 2020-08-05 DIAGNOSIS — D869 Sarcoidosis, unspecified: Secondary | ICD-10-CM | POA: Diagnosis not present

## 2020-08-05 DIAGNOSIS — R053 Chronic cough: Secondary | ICD-10-CM | POA: Diagnosis not present

## 2020-08-05 NOTE — Patient Instructions (Signed)
Please continue Breztri 2 puffs twice a day.  Rinse and gargle after using. Keep albuterol available use if needed shortness of breath, chest tightness, wheezing. We talked briefly today about possible referral for evaluation for lung transplant.  Notably we need to rush into this right now but it is good that were talking about it.  We will revisit and discuss possible timing at your next office visit. Continue your Protonix, Pepcid, loratadine, Flonase We will plan to repeat your CT scan of the chest in June 2022 Follow with Dr. Lamonte Sakai in 3 months or sooner if you have any problems.

## 2020-08-05 NOTE — Assessment & Plan Note (Signed)
Flaring symptoms in November which may reflect her sarcoid, or sinusitis.  She was treated with antibiotics and brief steroid taper with improvement.  Her CT chest from 12/10 is overall stable.  There may be some mild upper lobe groundglass present but I do not see any significant acute inflammatory change.  Most of the changes are chronic scar.  The left upper lobe cavity and mycetoma appear to be stable.  We discussed going back on chronic maintenance immunosuppression but no clear indication to do that at this time although her underlying disease is stage IV.  Question whether she will be a transplant referral candidate in the not too distant future.  We discussed this briefly today.  For now I will continue her maintenance bronchodilators, follow imaging and clinical status.

## 2020-08-05 NOTE — Assessment & Plan Note (Signed)
Continue your Protonix, Pepcid, loratadine, Flonase

## 2020-08-05 NOTE — Addendum Note (Signed)
Addended by: Gavin Potters R on: 08/05/2020 03:07 PM   Modules accepted: Orders

## 2020-08-05 NOTE — Progress Notes (Signed)
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 [name]), obstructive lung disease due to her sarcoid and also allergic component (sees Dr. Neldon Mc).  Most recent IgE 66 with a normal eosinophil count, sensitivity to dust mites.  She is also treated for GERD with pantoprozole and famotidine. She is doing [place] flonase. She uses albuterol about 1x a day. She was changed from Symbicort to [place] - she isn't sure that it has changed anything. She is coughing up yellow phlegm  She underwent bronchoscopy 07/29/2017 to evaluate for any evidence of invasive aspergillosis, ABPA, fungal studies negative at that time.  She has been on prednisone, methotrexate in the past, tolerated both poorly.  A high-resolution CT scan of the chest was performed on 03/22/2020 which I have reviewed, compared with her prior 04/07/2019.  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  There appears to be some interval increase and groundglass activity in the posterior left upper lobe and lateral segment of the right middle lobe compared with prior.   She has a grandson that she keeps, is around non-vaccinated people.   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  She also has a history of chronic cough in the setting of significant allergic rhinitis, GERD.  When I last saw her she was having more cough.  She has increased groundglass on her most recent CT 03/22/2020 and we discussed possibly repeating her bronchoscopy or treating with prednisone.  In the  end we decided to hold off and follow clinically.  I did treat her with Levaquin for 7 days for possible bronchitis/sinusitis.  Today she reports that her cough and mucous did improve. Now back to her baseline cough. She remains on Breztri, isn't sure its any better than Symbicort. Minimal albuterol use.  Remains on protonix, loratadine, pepcid, flonase    [name] 08/05/20 --follow-up visit for 64 year old woman with stage IV sarcoidosis, associated scarring and cavitary disease, nasal sinus disease and obstructive lung disease.  She has a left upper lobe cavity with an aspergilloma that has been radiographically and clinically stable.  Also with a history of chronic cough in the setting of allergic rhinitis with sinus disease, GERD, but possibly also in the setting of some increased groundglass changes on CT chest.  She improved after being treated with Levaquin at that time.  She has been maintained on Breztri. She was recently treated by her PCP with levaquin and methylpred taper in November for increased cough, phlegm production.   Repeat CT chest 08/02/2020 reviewed by me, shows stable honeycomb change with associated bronchiectasis and cavitary upper lobe disease.  Round solid left upper lobe cavitation with solid lesion consistent with a mycetoma.  There is mild groundglass density in the posterior aspect of the upper lobes that is unchanged compared with 01/2020.  She has axillary, bulky mediastinal and hilar lymphadenopathy.   EXAM :  Vitals:   08/05/20 1329  BP: 132/80  Pulse: 74  Temp: 98.2 F (36.8 C)  TempSrc: Oral  SpO2: 99%  Weight: 165 lb 6.4 oz (75 kg)  Height: 5\' 5"  (1.651 m)   Gen:  Pleasant, well-nourished, in no distress,  normal affect  ENT: No lesions,  mouth clear,  oropharynx clear, no postnasal drip, some hoarse voice today  Neck: No JVD, no stridor  Lungs: No use of accessory muscles, bilateral inspiratory crackles best heard at the bases  Cardiovascular: RRR, heart  sounds normal, no murmur or gallops, no peripheral edema  Musculoskeletal: No deformities, no cyanosis or clubbing  Neuro: alert, non focal  Skin: Warm, no lesions or rash   Sarcoidosis Flaring symptoms in November which may reflect her sarcoid, or sinusitis.  She was treated with antibiotics and brief steroid taper with improvement.  Her CT chest from 12/10 is overall stable.  There may be some mild upper lobe groundglass present but I do not see any significant acute inflammatory change.  Most of the changes are chronic scar.  The left upper lobe cavity and mycetoma appear to be stable.  We discussed going back on chronic maintenance immunosuppression but no clear indication to do that at this time although her underlying disease is stage IV.  Question whether she will be a transplant referral candidate in the not too distant future.  We discussed this briefly today.  For now I will continue her maintenance bronchodilators, follow imaging and clinical status.    Chronic cough Continue your Protonix, Pepcid, loratadine, Flonase  Baltazar Apo, MD, PhD 08/05/2020, 2:50 PM [facility] [phone] or if no answer [phone]

## 2020-08-06 ENCOUNTER — Other Ambulatory Visit: Payer: Self-pay

## 2020-08-06 MED ORDER — BREZTRI AEROSPHERE 160-9-4.8 MCG/ACT IN AERO: 2.0000 | INHALATION_SPRAY | Freq: Two times a day (BID) | RESPIRATORY_TRACT | 0 refills | Status: DC

## 2020-09-03 ENCOUNTER — Encounter: Payer: Self-pay | Admitting: Allergy and Immunology

## 2020-09-03 ENCOUNTER — Other Ambulatory Visit: Payer: Self-pay

## 2020-09-03 ENCOUNTER — Ambulatory Visit (INDEPENDENT_AMBULATORY_CARE_PROVIDER_SITE_OTHER): Payer: 59 | Admitting: Allergy and Immunology

## 2020-09-03 VITALS — BP 142/82 | HR 69 | Temp 97.8°F | Resp 16

## 2020-09-03 DIAGNOSIS — J4540 Moderate persistent asthma, uncomplicated: Secondary | ICD-10-CM

## 2020-09-03 DIAGNOSIS — D860 Sarcoidosis of lung: Secondary | ICD-10-CM | POA: Diagnosis not present

## 2020-09-03 DIAGNOSIS — J3089 Other allergic rhinitis: Secondary | ICD-10-CM | POA: Diagnosis not present

## 2020-09-03 DIAGNOSIS — J849 Interstitial pulmonary disease, unspecified: Secondary | ICD-10-CM | POA: Diagnosis not present

## 2020-09-03 DIAGNOSIS — J988 Other specified respiratory disorders: Secondary | ICD-10-CM

## 2020-09-03 DIAGNOSIS — K219 Gastro-esophageal reflux disease without esophagitis: Secondary | ICD-10-CM

## 2020-09-03 MED ORDER — BACTROBAN NASAL 2 % NA OINT: 1.0000 "application " | TOPICAL_OINTMENT | Freq: Three times a day (TID) | NASAL | 0 refills | Status: DC

## 2020-09-03 NOTE — Progress Notes (Signed)
[name]   Follow-up Note  Referring Provider: [name], NP Primary Provider: [name], NP Date of Office Visit: 09/03/2020  Subjective:   [name] (DOB: Feb 19, 1956) is a 65 y.o. female who returns to the [facility] on 09/03/2020 in re-evaluation of the following:  HPI: [name] returns to this clinic in reevaluation of chronic respiratory tract symptoms manifested as cough with contribution from asthma and LPR and sarcoidosis with interstitial lung disease/fibrosis. Her last visit to this clinic was 27 February 2020.  She continues to have problems with coughing.  Over the course of the past month she has become somewhat worse.  Apparently this was triggered by her grandson's rhinovirus.  Since that point in time she has been having a lot more coughing and she has been making some very ugly sputum production.  The sputum production is a thick gooey stringy yellow mucus that comes out of both her chest and to some degree her throat.  Apparently she has already been treated with a systemic steroid and Levaquin at the beginning of this ordeal which did appear to help her somewhat but she has never returned back to baseline.  She believes that her reflux is under very good control.  She did visit with Dr. Lamonte Sakai, pulmonology, and his assessment of her lung status is that she does have some active sarcoid in the setting of stage IV disease and would benefit from prednisone therapy to both help her symptoms and help prevent the development of more fibrosis.    She has had 3 Moderna COVID vaccines and the flu vaccine.  Allergies as of 09/03/2020   No Known Allergies     Medication List      albuterol 108 (90 Base) MCG/ACT inhaler Commonly known as: VENTOLIN HFA USE 1 INHALATION BY MOUTH  EVERY 4 HOURS AS NEEDED FOR WHEEZING OR SHORTNESS OF  BREATH.   ALEVE PM PO Take 1 tablet by mouth at bedtime as  needed and may repeat dose one time if needed.   aspirin 81 MG tablet Take 81 mg by mouth daily.   aspirin-acetaminophen-caffeine 250-250-65 MG tablet Commonly known as: EXCEDRIN MIGRAINE Take 1 tablet by mouth every 6 (six) hours as needed for pain.   atorvastatin 40 MG tablet Commonly known as: LIPITOR Take 1 tablet (40 mg total) by mouth daily.   Breztri Aerosphere 160-9-4.8 MCG/ACT Aero Generic drug: Budeson-Glycopyrrol-Formoterol Inhale 2 puffs into the lungs in the morning and at bedtime. Rinse, gargle, and spit after use.   famotidine 40 MG tablet Commonly known as: PEPCID Take 1 tablet (40 mg total) by mouth at bedtime.   fenofibrate 160 MG tablet Take 160 mg by mouth daily.   fluticasone 50 MCG/ACT nasal spray Commonly known as: FLONASE USE 1 SPRAY INTO BOTH  NOSTRILS DAILY FOLLOWING NASAL WASH   ibuprofen 200 MG tablet Commonly known as: ADVIL Take 400 mg by mouth every 6 (six) hours as needed.   loratadine 10 MG tablet Commonly known as: CLARITIN Take 1 tablet (10 mg total) by mouth daily.   losartan-hydrochlorothiazide 50-12.5 MG tablet Commonly known as: HYZAAR Take 1 tablet by mouth daily.   metoCLOPramide 10 MG tablet Commonly known as: REGLAN Take 10 mg by mouth 4 (four) times daily.   multivitamin capsule Take 1 capsule by mouth daily.   niacin 1000 MG CR tablet Commonly known as: NIASPAN Take 1,000 mg by mouth at bedtime.  pantoprazole 40 MG tablet Commonly known as: PROTONIX TAKE 1 TABLET BY MOUTH  TWICE DAILY   Vitamin D-3 125 MCG (5000 UT) Tabs Take 5,000 Units by mouth daily.       Past Medical History:  Diagnosis Date  . Angio-edema   . Chronic sinusitis   . Cough   . Diabetes mellitus without complication (HCC)   . Diverticulosis 2009   mild sigmoid  . High cholesterol   . Hypertension   . Recurrent upper respiratory infection (URI)   . Sarcoidosis   . Vitamin D deficiency     Past Surgical History:  Procedure  Laterality Date  . COLONOSCOPY    . LYMPHADENECTOMY    . NOSE SURGERY    . SINOSCOPY    . TUBAL LIGATION    . VESICOVAGINAL FISTULA CLOSURE W/ TAH    . VIDEO BRONCHOSCOPY Bilateral 07/29/2017   Procedure: VIDEO BRONCHOSCOPY WITH FLUORO;  Surgeon: Leslye PeerByrum, Robert S, MD;  Location: [facility];  Service: Cardiopulmonary;  Laterality: Bilateral;    Review of systems negative except as noted in HPI / PMHx or noted below:  Review of Systems  Constitutional: Negative.   HENT: Negative.   Eyes: Negative.   Respiratory: Negative.   Cardiovascular: Negative.   Gastrointestinal: Negative.   Genitourinary: Negative.   Musculoskeletal: Negative.   Skin: Negative.   Neurological: Negative.   Endo/Heme/Allergies: Negative.   Psychiatric/Behavioral: Negative.      Objective:   Vitals:   09/03/20 1701  BP: (!) 142/82  Pulse: 69  Resp: 16  Temp: 97.8 F (36.6 C)  SpO2: 97%          Physical Exam Constitutional:      Appearance: She is not diaphoretic.  HENT:     Head: Normocephalic.     Right Ear: Tympanic membrane, ear canal and external ear normal.     Left Ear: Tympanic membrane, ear canal and external ear normal.     Nose: Mucosal edema (Multiple areas of bleeding bilateral nostrils ) present. No rhinorrhea.     Mouth/Throat:     Mouth: Oropharynx is clear and moist and mucous membranes are normal.     Pharynx: Uvula midline. No oropharyngeal exudate.  Eyes:     Conjunctiva/sclera: Conjunctivae normal.  Neck:     Thyroid: No thyromegaly.     Trachea: Trachea normal. No tracheal tenderness or tracheal deviation.  Cardiovascular:     Rate and Rhythm: Normal rate and regular rhythm.     Heart sounds: Normal heart sounds, S1 normal and S2 normal. No murmur heard.   Pulmonary:     Effort: No respiratory distress.     Breath sounds: No stridor. No wheezing (Widespread inspiratory crackles all lung fields coughing during inhalation) or rales.  Musculoskeletal:         General: No edema.  Lymphadenopathy:     Head:     Right side of head: No tonsillar adenopathy.     Left side of head: No tonsillar adenopathy.     Cervical: No cervical adenopathy.  Skin:    Findings: No erythema or rash.     Nails: There is no clubbing.  Neurological:     Mental Status: She is alert.     Diagnostics:    Spirometry was performed and demonstrated an FEV1 of 1.41 at 68 % of predicted.  The patient had an Asthma Control Test with the following results: ACT Total Score: 21.    Assessment and Plan:  1. Asthma, moderate persistent, well-controlled   2. Sarcoidosis of lung ([place])   3. Interstitial lung disease ([place])   4. Perennial allergic rhinitis   5. LPRD (laryngopharyngeal reflux disease)   6. Respiratory tract infection     1.  Allergen avoidance measures - house dust mite  2.  Continue to treat inflammation of airway:   A.  Breztri - 2 inhalations 2 times a day   3.  Hold off on using Flonase for 10 days and then restart using Flonase 1 spray each nostril only 1 time per day.  Use Bactroban in each nostril 3 times per day for 10 days.   4.  Continue to treat reflux:   A.  Eliminate use of fish oil, caffeine, chocolate  B.  Raise head of bed  C.  No late meals  D.  Pantoprazole 40 mg twice a day  E.  Famotidine 40 mg in evening  F.  Metoclopramide 10 mg 2 times per day  5.  If needed:   A.  OTC antihistamine   B.  Albuterol HFA-2 inhalations once a day  6. Collect sputum for bacterial culture, AFB smear and culture  7. Return to clinic in 6 months or earlier if problem  [name] has terrible lungs and there has been a fair amount of architectural changes that have occurred secondary to her sarcoidosis.  I think she is adequately treated for the asthmatic component of this issue but I am somewhat afraid that she is going to have progression of her fibrotic lung disease and there is definitely the possibility that she would do better both  symptomatically and preventatively if she utilized systemic steroids as suggested by Dr. Lamonte Sakai.  At this point I do not think [name] is extremely interested in undergoing therapy with an immunosuppressive drug because of the current COVID pandemic.  She appears to have some form of nasal irritation and possible infectious rhinitis and we will treat her with topical Bactroban.  We will culture her sputum as noted above.  Given the fact that she has been treated with broad-spectrum antibiotics and systemic steroids recently I hope that she has not developed a secondary Pseudomonas infection but the description of her sputum certainly sounds as though that may be the case.  I will contact her once I have the results of her sputum culture available for review.  Allena Katz, MD [facility] [place]

## 2020-09-03 NOTE — Patient Instructions (Addendum)
  1.  Allergen avoidance measures - house dust mite  2.  Continue to treat inflammation of airway:   A.  Breztri - 2 inhalations 2 times a day   3.  Hold off on using Flonase for 10 days and then restart using Flonase 1 spray each nostril only 1 time per day.  Use Bactroban in each nostril 3 times per day for 10 days.   4.  Continue to treat reflux:   A.  Eliminate use of fish oil, caffeine, chocolate  B.  Raise head of bed  C.  No late meals  D.  Pantoprazole 40 mg twice a day  E.  Famotidine 40 mg in evening  F.  Metoclopramide 10 mg 2 times per day  5.  If needed:   A.  OTC antihistamine   B.  Albuterol HFA-2 inhalations once a day  6. Collect sputum for bacterial culture, AFB smear and culture  7. Return to clinic in 6 months or earlier if problem

## 2020-09-04 ENCOUNTER — Encounter: Payer: Self-pay | Admitting: Allergy and Immunology

## 2020-09-05 ENCOUNTER — Telehealth: Payer: Self-pay

## 2020-09-05 MED ORDER — MUPIROCIN 2 % EX OINT: TOPICAL_OINTMENT | CUTANEOUS | 0 refills | Status: DC

## 2020-09-05 NOTE — Telephone Encounter (Signed)
Called and left a message for patient that the nasal ointment has been sent in and that she could pick it up at her earliest convince.

## 2020-09-05 NOTE — Telephone Encounter (Signed)
Patient pharmacy faxed and communicated that the Mupirocin nasal ointment 2% is now discontinued. Pharmacy wants to know if you want to switch to regular mupirocin 22 gm tube. Please advise. Thank you.

## 2020-09-05 NOTE — Telephone Encounter (Signed)
Correct, please use the mupirocin 22 g tube.

## 2020-10-14 ENCOUNTER — Other Ambulatory Visit: Payer: Self-pay | Admitting: Allergy and Immunology

## 2020-10-22 ENCOUNTER — Other Ambulatory Visit: Payer: Self-pay | Admitting: *Deleted

## 2020-10-22 MED ORDER — BREZTRI AEROSPHERE 160-9-4.8 MCG/ACT IN AERO: INHALATION_SPRAY | RESPIRATORY_TRACT | 0 refills | Status: DC

## 2020-10-25 ENCOUNTER — Other Ambulatory Visit: Payer: Self-pay

## 2020-10-25 MED ORDER — BREZTRI AEROSPHERE 160-9-4.8 MCG/ACT IN AERO: INHALATION_SPRAY | RESPIRATORY_TRACT | 1 refills | Status: DC

## 2020-11-23 ENCOUNTER — Other Ambulatory Visit: Payer: Self-pay | Admitting: Allergy and Immunology

## 2020-11-26 IMAGING — CT CT PARANASAL SINUS LIMITED WITHOUT CONTRAST
1 of 2 series · 9 of 12 positions shown, 12 images · non-contrast
Comparison: None.

CLINICAL DATA: Sinusitis

EXAM:
CT PARANASAL SINUS LIMITED WITHOUT CONTRAST
TECHNIQUE: Non-contiguous multidetector CT images of the paranasal sinuses were
obtained in a single plane without contrast.

[Series 4: limited sinus st · axial · 0.24mm/px · z∈[+143,+223]mm · 9 of 11 slices shown, 12 images]
[im 2/11  brain]
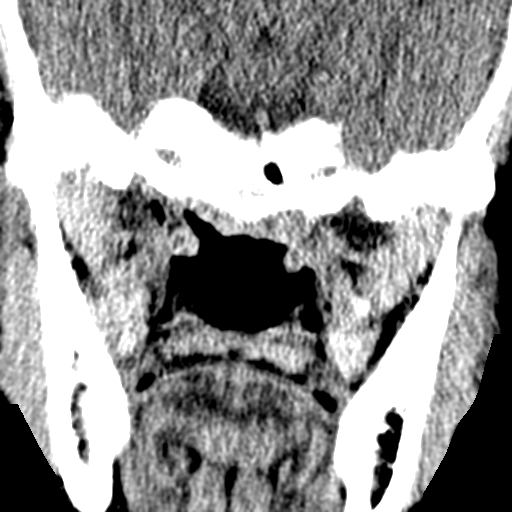
[im 2/11  bone]
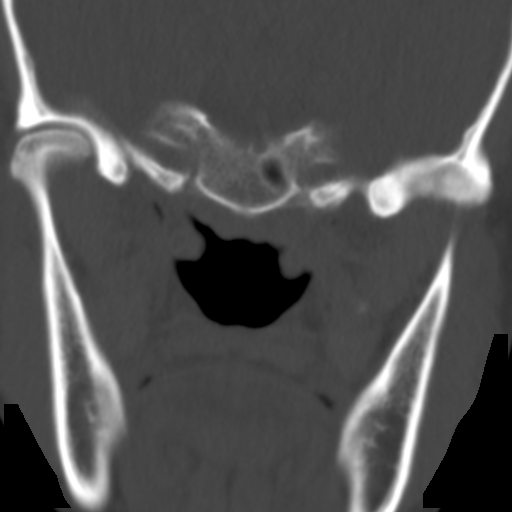
[im 3/11  bone]
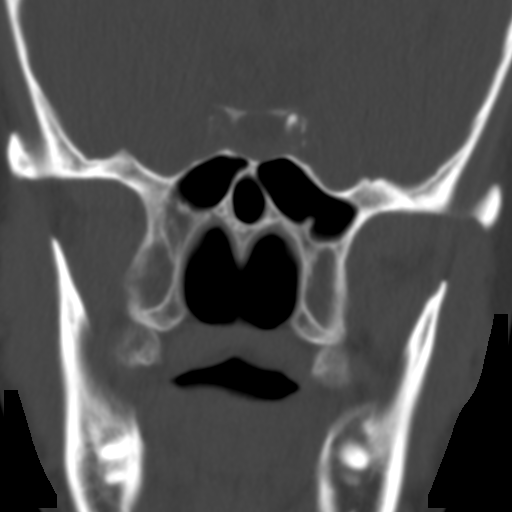
[im 4/11  bone]
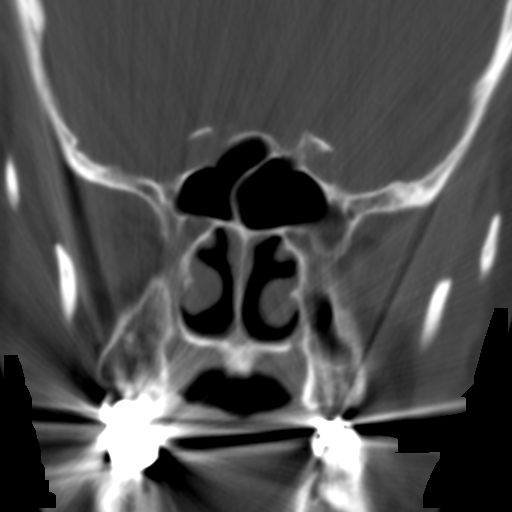
[im 5/11  bone]
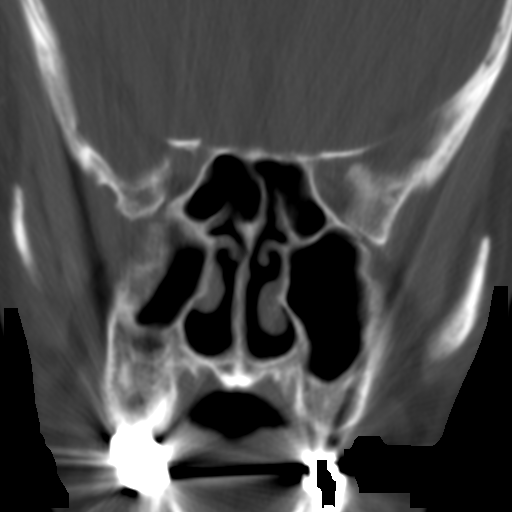
[im 6/11  brain]
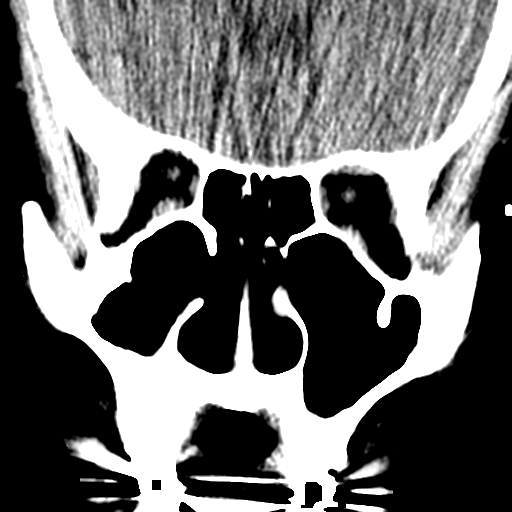
[im 6/11  bone]
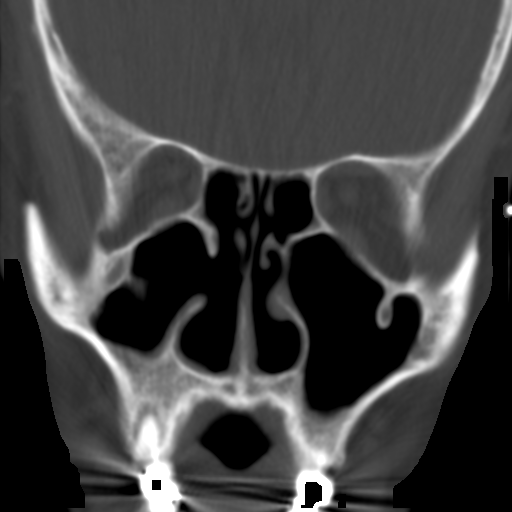
[im 7/11  bone]
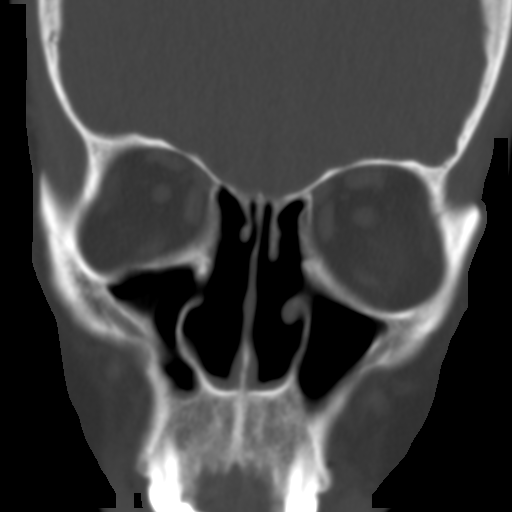
[im 8/11  bone]
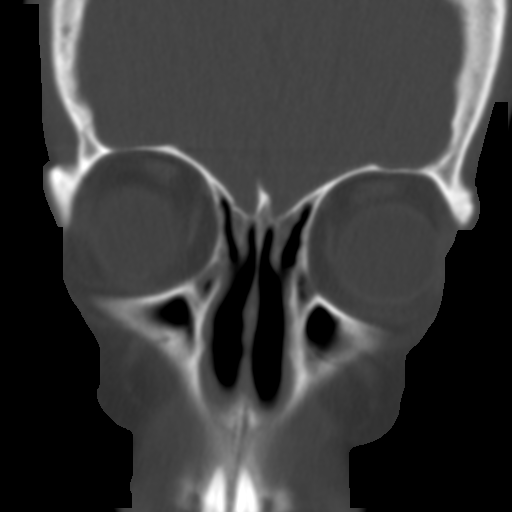
[im 9/11  bone]
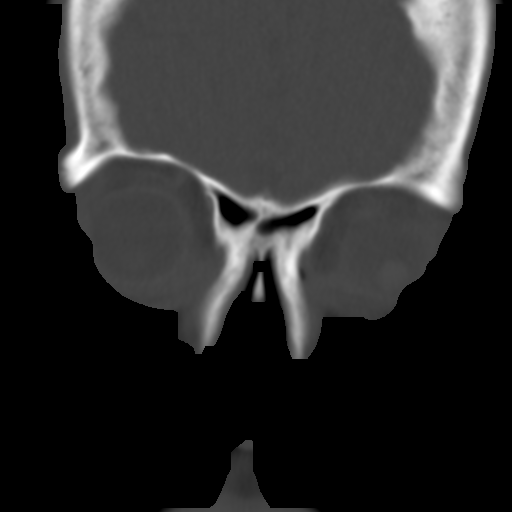
[im 10/11  brain]
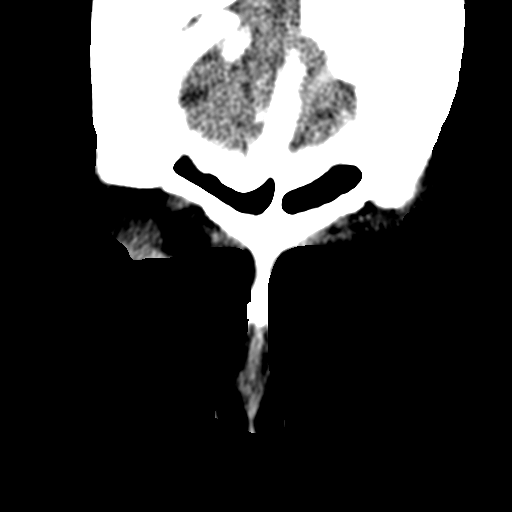
[im 10/11  bone]
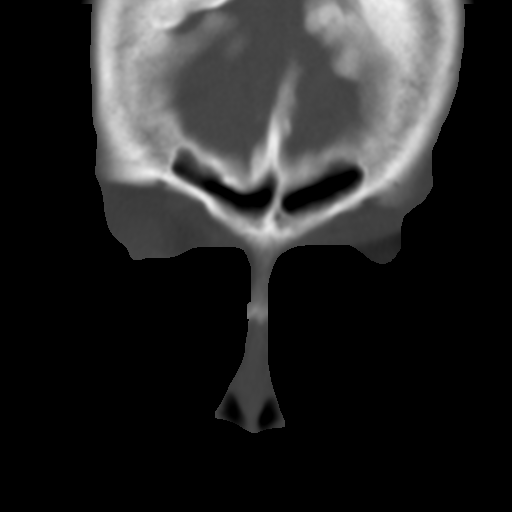

[9 of 12 positions shown; findings below may reference images not displayed]

FINDINGS: Findings of prior sinus surgery. No paranasal sinus mucosal
thickening.
IMPRESSION: No evidence of sinusitis.

## 2020-11-26 IMAGING — CT CT CHEST HIGH RESOLUTION WITHOUT CONTRAST
2 of 5 series · 14 of 36 positions shown, 17 images · non-contrast
Comparison: Multiple priors, most recently chest CT 09/13/2018.

CLINICAL DATA: 63-year-old female with history of sarcoidosis.
Persistent chest congestion and productive cough for 1 year.

EXAM:
CT CHEST WITHOUT CONTRAST
TECHNIQUE: Multidetector CT imaging of the chest was performed following the
standard protocol without intravenous contrast. High resolution
imaging of the lungs, as well as inspiratory and expiratory imaging,
was performed.

[Series 2: high resolution · axial · 0.62mm/px · z∈[-309,-69]mm · 11 of 134 slices shown, 14 images]
[im 7/134  mediastinal]
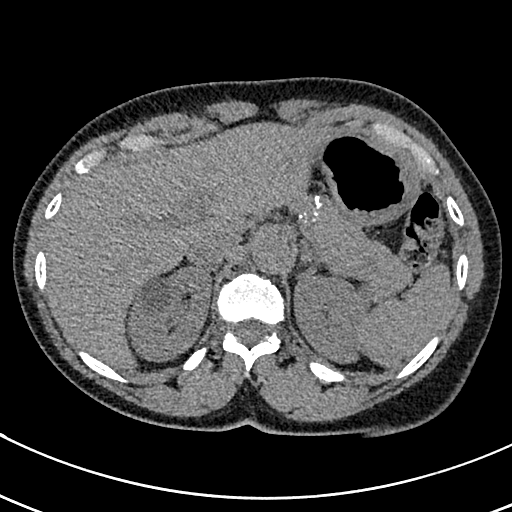
[im 7/134  lung]
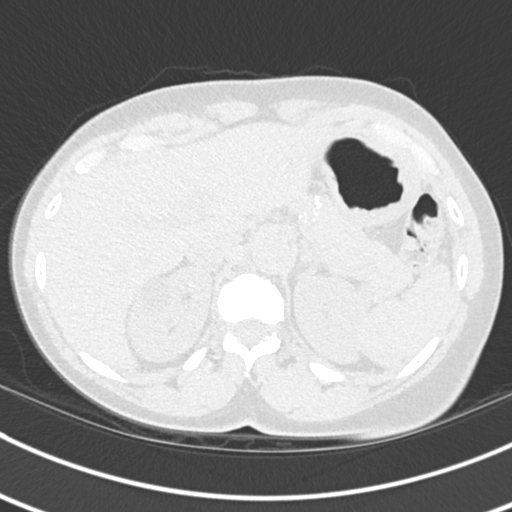
[im 19/134  lung]
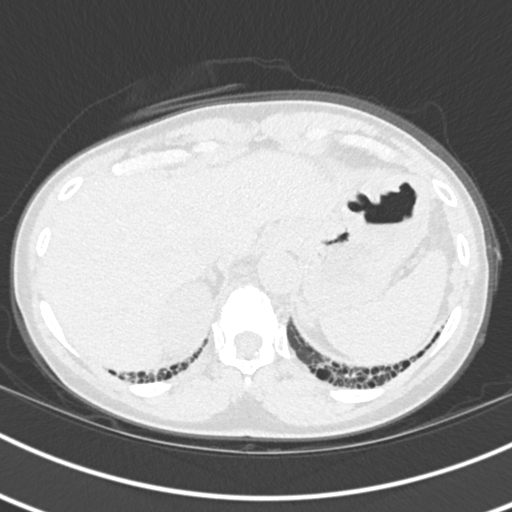
[im 31/134  lung]
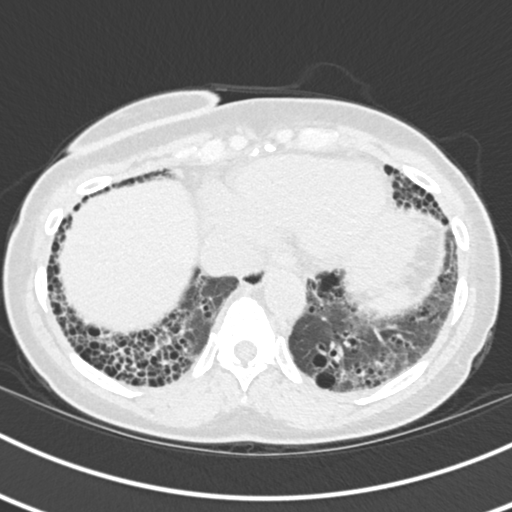
[im 43/134  lung]
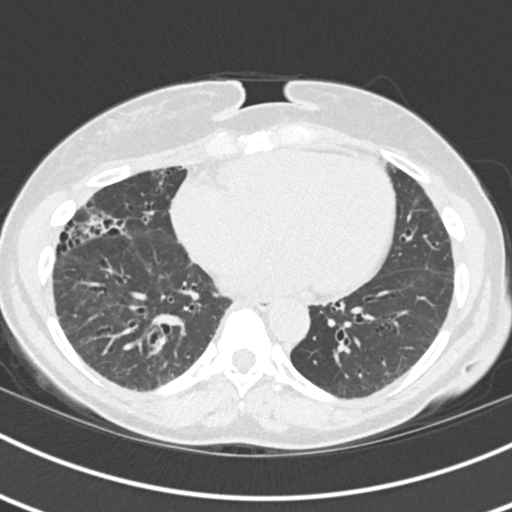
[im 55/134  mediastinal]
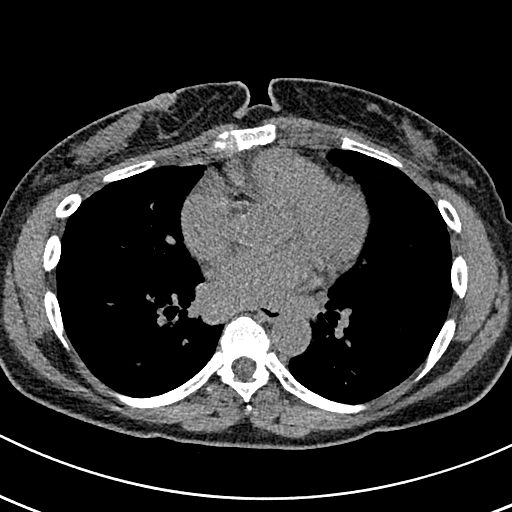
[im 55/134  lung]
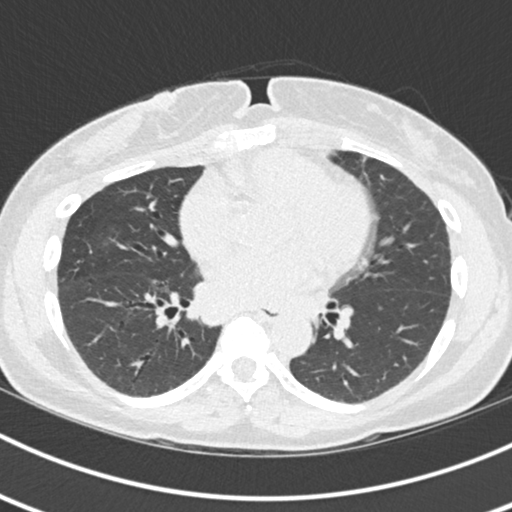
[im 67/134  lung]
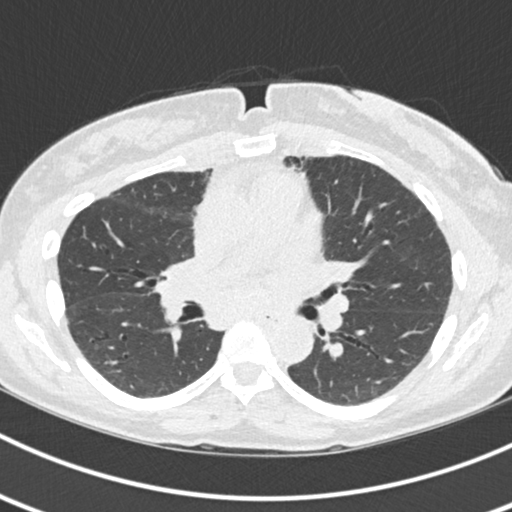
[im 79/134  lung]
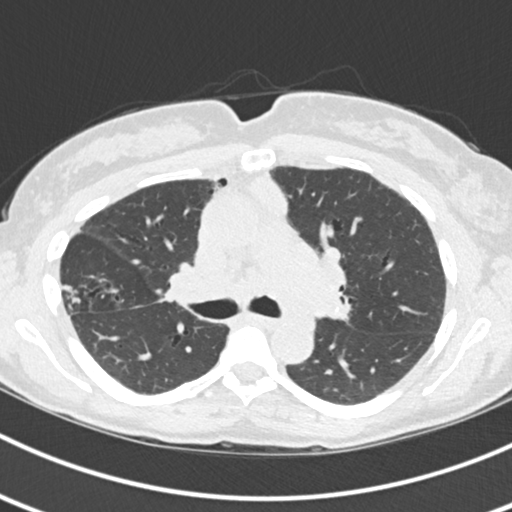
[im 91/134  lung]
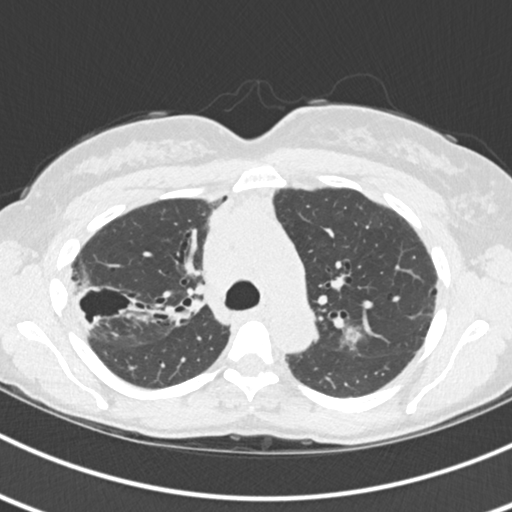
[im 103/134  mediastinal]
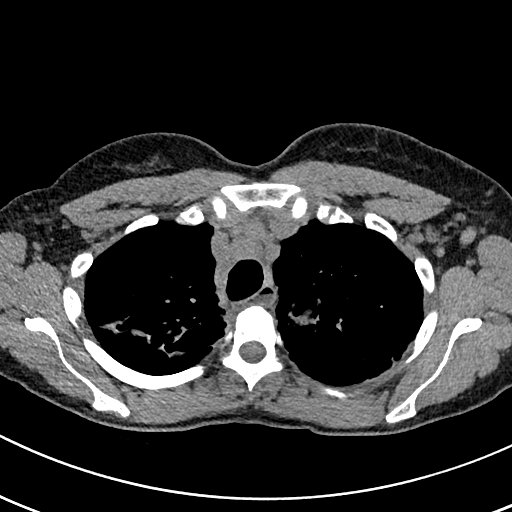
[im 103/134  lung]
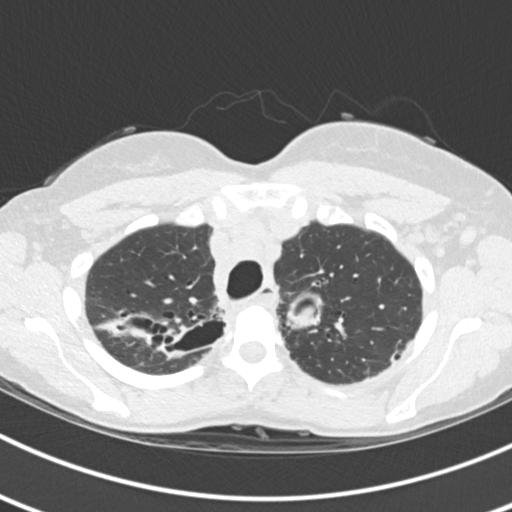
[im 115/134  lung]
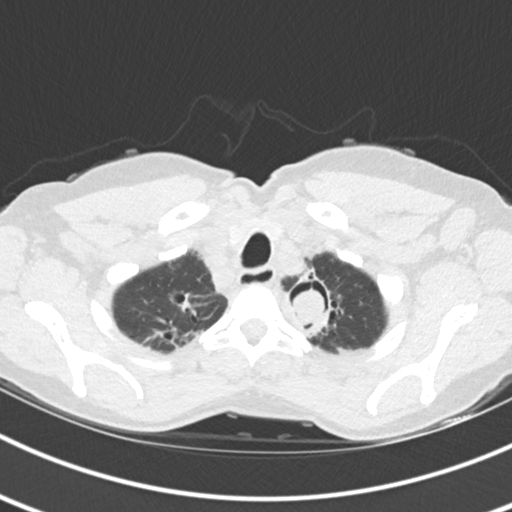
[im 127/134  lung]
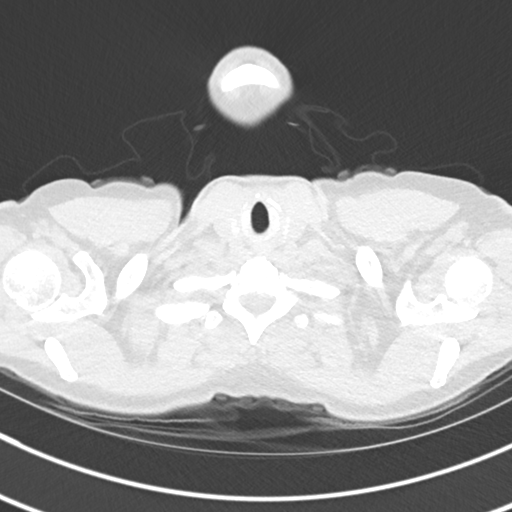

[Series 10: coronal · coronal · 0.55mm/px · 3 of 101 slices shown]
[im 21/101  lung]
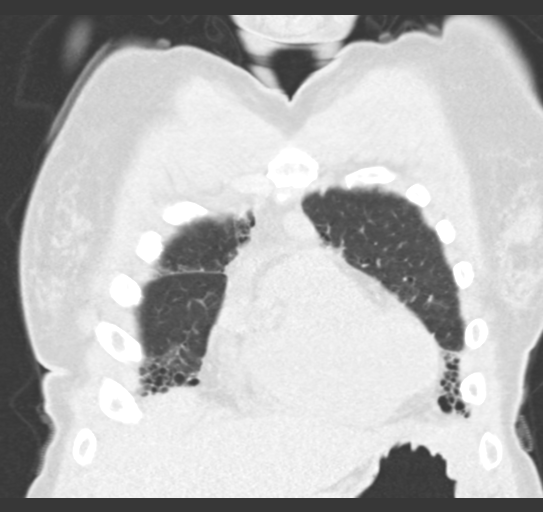
[im 41/101  lung]
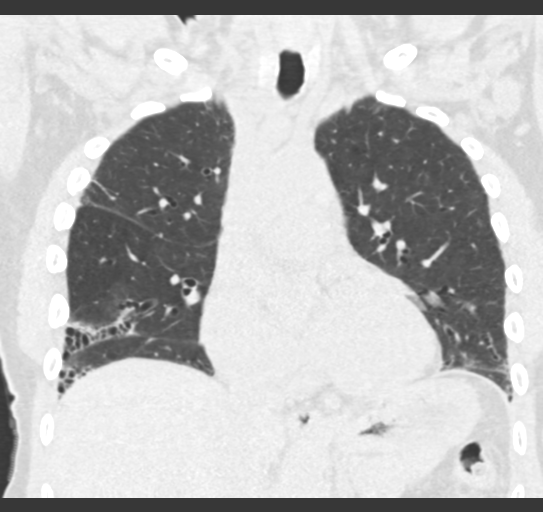
[im 61/101  lung]
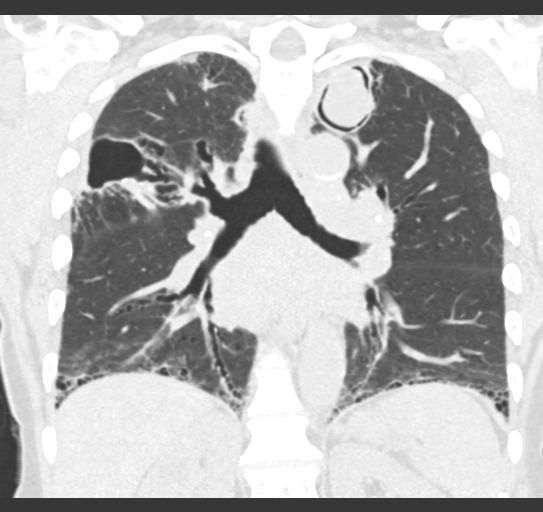

[14 of 36 positions shown; findings below may reference images not displayed]

FINDINGS: Cardiovascular: Heart size is normal. There is no significant
pericardial fluid, thickening or pericardial calcification. There is
aortic atherosclerosis, as well as atherosclerosis of the great
vessels of the mediastinum and the coronary arteries, including
calcified atherosclerotic plaque in the left anterior descending and
right coronary arteries.

Mediastinum/Nodes: Multiple borderline enlarged and enlarged
mediastinal and bilateral hilar lymph nodes, many of which have some
internal calcifications, similar to the prior examination,
compatible with reported clinical history of sarcoidosis. Esophagus
is unremarkable in appearance. Enlarged left axillary lymph node
measuring up to 1.7 cm in short axis, similar to the prior study. No
right axillary lymphadenopathy.

Lungs/Pleura: Widespread areas of cylindrical, varicose and cystic
bronchiectasis are again noted in the lungs bilaterally.
Fibrocavitary changes are noted in the upper lobes of the lungs,
including a large cavity in the left upper lobe medially near the
apex where there is some internal soft tissue (axial image 24 of
series 3), similar to the prior study, suspicious for internal
aspergilloma. Multiple other small pulmonary nodules scattered
throughout the lungs bilaterally, most of which are subpleural in
location or peribronchovascular (i.e., the overall distribution is
perilymphatic). Extensive honeycombing in the lung bases
bilaterally, unchanged. No pleural effusions.

Upper Abdomen: 2.3 cm low-attenuation lesion in the upper pole the
right kidney, incompletely characterized on today's non-contrast CT
examination, but statistically likely to represent a cyst.

Musculoskeletal: There are no aggressive appearing lytic or blastic
lesions noted in the visualized portions of the skeleton.
IMPRESSION: 1. Imaging stigmata of sarcoidosis with a spectrum of findings is
very similar to the prior examination from September 13, 2018,
including a thick-walled cavity in the medial aspect of the left
upper lobe near the apex where there is internal soft tissue which
is presumably an aspergilloma.
2. Extensive honeycombing in the lung bases bilaterally, which may
be a manifestation of sarcoidosis, however, the appearance is
somewhat unusual and the possibility of usual interstitial pneumonia
(UIP) is not entirely excluded.
3. Aortic atherosclerosis, in addition to 2 vessel coronary artery
disease. Please note that although the presence of coronary artery
calcium documents the presence of coronary artery disease, the
severity of this disease and any potential stenosis cannot be
assessed on this non-gated CT examination. Assessment for potential
risk factor modification, dietary therapy or pharmacologic therapy
may be warranted, if clinically indicated.

Aortic Atherosclerosis ([id number]).

## 2020-12-16 ENCOUNTER — Telehealth: Payer: Self-pay

## 2020-12-16 NOTE — Telephone Encounter (Signed)
Progress notes on file sent from, [facility], phone no.  [phone]. Copy of referral order sent to scheduling.

## 2020-12-19 ENCOUNTER — Telehealth: Payer: Self-pay

## 2020-12-19 NOTE — Telephone Encounter (Signed)
FAXED NOTES TO [facility] 

## 2020-12-23 ENCOUNTER — Telehealth: Payer: Self-pay | Admitting: Cardiovascular Disease

## 2020-12-23 ENCOUNTER — Emergency Department (HOSPITAL_COMMUNITY)
Admission: EM | Admit: 2020-12-23 | Discharge: 2020-12-23 | Disposition: A | Discharge status: Home / Self Care | Payer: 59 | Attending: Emergency Medicine | Admitting: Emergency Medicine

## 2020-12-23 ENCOUNTER — Other Ambulatory Visit: Payer: Self-pay

## 2020-12-23 ENCOUNTER — Encounter (HOSPITAL_COMMUNITY): Payer: Self-pay

## 2020-12-23 ENCOUNTER — Emergency Department (HOSPITAL_COMMUNITY): Payer: 59

## 2020-12-23 DIAGNOSIS — Z20822 Contact with and (suspected) exposure to covid-19: Secondary | ICD-10-CM | POA: Diagnosis not present

## 2020-12-23 DIAGNOSIS — G8194 Hemiplegia, unspecified affecting left nondominant side: Secondary | ICD-10-CM

## 2020-12-23 DIAGNOSIS — I69952 Hemiplegia and hemiparesis following unspecified cerebrovascular disease affecting left dominant side: Secondary | ICD-10-CM | POA: Diagnosis not present

## 2020-12-23 DIAGNOSIS — Z7982 Long term (current) use of aspirin: Secondary | ICD-10-CM | POA: Diagnosis not present

## 2020-12-23 DIAGNOSIS — Z79899 Other long term (current) drug therapy: Secondary | ICD-10-CM | POA: Insufficient documentation

## 2020-12-23 DIAGNOSIS — I10 Essential (primary) hypertension: Secondary | ICD-10-CM | POA: Insufficient documentation

## 2020-12-23 DIAGNOSIS — E119 Type 2 diabetes mellitus without complications: Secondary | ICD-10-CM | POA: Diagnosis not present

## 2020-12-23 DIAGNOSIS — R42 Dizziness and giddiness: Secondary | ICD-10-CM | POA: Diagnosis not present

## 2020-12-23 DIAGNOSIS — R531 Weakness: Secondary | ICD-10-CM | POA: Diagnosis present

## 2020-12-23 LAB — URINALYSIS, ROUTINE W REFLEX MICROSCOPIC
Bilirubin Urine: NEGATIVE
Glucose, UA: NEGATIVE mg/dL
Hgb urine dipstick: NEGATIVE
Ketones, ur: NEGATIVE mg/dL
Leukocytes,Ua: NEGATIVE
Nitrite: NEGATIVE
Protein, ur: NEGATIVE mg/dL
Specific Gravity, Urine: 1.006 (ref 1.005–1.030)
pH: 7 (ref 5.0–8.0)

## 2020-12-23 LAB — COMPREHENSIVE METABOLIC PANEL
ALT: 14 U/L (ref 0–44)
AST: 21 U/L (ref 15–41)
Albumin: 3.8 g/dL (ref 3.5–5.0)
Alkaline Phosphatase: 69 U/L (ref 38–126)
Anion gap: 9 (ref 5–15)
BUN: 24 mg/dL — ABNORMAL HIGH (ref 8–23)
CO2: 29 mmol/L (ref 22–32)
Calcium: 10 mg/dL (ref 8.9–10.3)
Chloride: 102 mmol/L (ref 98–111)
Creatinine, Ser: 0.86 mg/dL (ref 0.44–1.00)
GFR, Estimated: >60 mL/min (ref >60)
Glucose, Bld: 89 mg/dL (ref 70–99)
Potassium: 3.6 mmol/L (ref 3.5–5.1)
Sodium: 140 mmol/L (ref 135–145)
Total Bilirubin: 0.4 mg/dL (ref 0.3–1.2)
Total Protein: 7.2 g/dL (ref 6.5–8.1)

## 2020-12-23 LAB — RAPID URINE DRUG SCREEN, HOSP PERFORMED
Amphetamines: NOT DETECTED
Barbiturates: NOT DETECTED
Benzodiazepines: NOT DETECTED
Cocaine: NOT DETECTED
Opiates: NOT DETECTED
Tetrahydrocannabinol: NOT DETECTED

## 2020-12-23 LAB — I-STAT CHEM 8, ED
BUN: 23 mg/dL (ref 8–23)
Calcium, Ion: 1.29 mmol/L (ref 1.15–1.40)
Chloride: 100 mmol/L (ref 98–111)
Creatinine, Ser: 0.9 mg/dL (ref 0.44–1.00)
Glucose, Bld: 91 mg/dL (ref 70–99)
HCT: 36 % (ref 36.0–46.0)
Hemoglobin: 12.2 g/dL (ref 12.0–15.0)
Potassium: 3.3 mmol/L — ABNORMAL LOW (ref 3.5–5.1)
Sodium: 140 mmol/L (ref 135–145)
TCO2: 28 mmol/L (ref 22–32)

## 2020-12-23 LAB — DIFFERENTIAL
Abs Immature Granulocytes: 0.02 10*3/uL (ref 0.00–0.07)
Basophils Absolute: 0.1 10*3/uL (ref 0.0–0.1)
Basophils Relative: 1 %
Eosinophils Absolute: 0.6 10*3/uL — ABNORMAL HIGH (ref 0.0–0.5)
Eosinophils Relative: 8 %
Immature Granulocytes: 0 %
Lymphocytes Relative: 22 %
Lymphs Abs: 1.7 10*3/uL (ref 0.7–4.0)
Monocytes Absolute: 0.8 10*3/uL (ref 0.1–1.0)
Monocytes Relative: 9 %
Neutro Abs: 4.8 10*3/uL (ref 1.7–7.7)
Neutrophils Relative %: 60 %

## 2020-12-23 LAB — CBC
HCT: 36.3 % (ref 36.0–46.0)
Hemoglobin: 11.6 g/dL — ABNORMAL LOW (ref 12.0–15.0)
MCH: 27.5 pg (ref 26.0–34.0)
MCHC: 32 g/dL (ref 30.0–36.0)
MCV: 86 fL (ref 80.0–100.0)
Platelets: 214 10*3/uL (ref 150–400)
RBC: 4.22 MIL/uL (ref 3.87–5.11)
RDW: 13.6 % (ref 11.5–15.5)
WBC: 8 10*3/uL (ref 4.0–10.5)
nRBC: 0 % (ref 0.0–0.2)

## 2020-12-23 LAB — ETHANOL: Alcohol, Ethyl (B): <10 mg/dL (ref <10)

## 2020-12-23 LAB — RESP PANEL BY RT-PCR (FLU A&B, COVID) ARPGX2
Influenza A by PCR: NEGATIVE
Influenza B by PCR: NEGATIVE
SARS Coronavirus 2 by RT PCR: NEGATIVE

## 2020-12-23 LAB — PROTIME-INR
INR: 1 (ref 0.8–1.2)
Prothrombin Time: 13.4 seconds (ref 11.4–15.2)

## 2020-12-23 LAB — CBG MONITORING, ED: Glucose-Capillary: 84 mg/dL (ref 70–99)

## 2020-12-23 LAB — APTT: aPTT: 25 seconds (ref 24–36)

## 2020-12-23 NOTE — Telephone Encounter (Signed)
Pt c/o BP issue: STAT if pt c/o blurred vision, one-sided weakness or slurred speech  1. What are your last 5 BP readings? 169/something, yesterday 154/102  2. Are you having any other symptoms (ex. Dizziness, headache, blurred vision, passed out)? Dizzy, can't see clearly but thinks it is glasses, left sided numbness/weakness  3. What is your BP issue? Patient states she saw her PCP for dizziness and her BP was high. She states she is feeling dizzy now but can walk without holding something. She states it was 169/somthing. She states she feels a numb tingling on her left side and it does feel weak. She states it is contestant and feels it now. She states she also has a dull headache at the front part of her head.

## 2020-12-23 NOTE — Telephone Encounter (Signed)
Saw her PCP office for elevated BP beginning of April and they wanted her to see cardiology and neurology for BP and dizziness. Neurology appointment in July.  Left side weakness and numbness since ~ 12/04/20. (after PCP visit)  She is also feeling foggy in her head with blurred vision. The patient is taking losartan-hydrochlorothiazide 50-12.5 daily started by PCP in what looks like 2017 no changes made at visit. 164/91 71, medication taken 7:30 am. Advised patient to call Neurology and up date them with changes of Left sided weakness, numbness, foggy brain, and vision changes since her appointment was made with them by PCP office. Of note follow up is 01/22/21 with app at [facility] office. Spoke with Dr. Angelena Form, okay to forward to Dr. Gwenlyn Found for advisement.

## 2020-12-23 NOTE — ED Provider Notes (Signed)
[facility] DEPT Provider Note   CSN: [id number] Arrival date & time: 12/23/20  1621     History Chief Complaint  Patient presents with  . Dizziness  . Weakness    [name] is a 65 y.o. female.  The history is provided by the patient and medical records. No language interpreter was used.  Dizziness Associated symptoms: weakness   Weakness Associated symptoms: dizziness      65 year old female significant history of diabetes, hypertension, hypercholesterolemia presents to ED with complaints of dizziness.  Patient reports she developed dizziness since April 21 of this year.  She describes dizziness more as a lightheadedness with positional change and not room spinning sensation.  This has been an ongoing situation in which you feel that unsteady and she did discuss this with her primary care doctor who recommend neurology and cardiology follow-up.  Follow-up is not until several months later.  Yesterday she developed numbness and tingling sensation affecting the left side of her body which including her face down to her feet.  No significant weakness reported.  No fever chills runny nose sneezing coughing nausea vomiting diarrhea chest pain shortness of breath abdominal pain or dysuria.  No history of prior stroke.  Not on any blood thinner medication.  No significant pain at this time.  Past Medical History:  Diagnosis Date  . Angio-edema   . Chronic sinusitis   . Cough   . Diabetes mellitus without complication ([place])   . Diverticulosis 2009   mild sigmoid  . High cholesterol   . Hypertension   . Recurrent upper respiratory infection (URI)   . Sarcoidosis   . Vitamin D deficiency     Patient Active Problem List   Diagnosis Date Noted  . Essential hypertension 07/19/2019  . Hyperlipidemia 07/19/2019  . Coronary artery calcification seen on CT scan 07/19/2019  . Post-nasal drip 02/22/2019  . Colon cancer screening 02/22/2019  .  Diastasis recti 02/22/2019  . Interstitial pneumonia ([place]) suspected on CT scan 01/02/2019  . Abnormal finding on lung imaging 10/28/2018  . GERD without esophagitis? 10/27/2018  . 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 Sarcoid arthropathy ([place]) 01/08/2016  . Hereditary and idiopathic peripheral neuropathy 05/20/2014  . Anal condyloma 08/03/2013  . Chronic sinusitis 09/15/2011  . Chronic cough 06/16/2010  . Sarcoidosis 07/10/2008    Past Surgical History:  Procedure Laterality Date  . COLONOSCOPY    . LYMPHADENECTOMY    . NOSE SURGERY    . SINOSCOPY    . TUBAL LIGATION    . VESICOVAGINAL FISTULA CLOSURE W/ TAH    . VIDEO BRONCHOSCOPY Bilateral 07/29/2017   Procedure: VIDEO BRONCHOSCOPY WITH FLUORO;  Surgeon: Collene Gobble, MD;  Location: [facility];  Service: Cardiopulmonary;  Laterality: Bilateral;     OB History   No obstetric history on file.     Family History  Problem Relation Age of Onset  . Stroke Father   . Heart murmur Father   . Diabetes Father   . Emphysema Mother   . COPD Mother   . Breast cancer Sister   . Colon cancer Neg Hx   . Esophageal cancer Neg Hx   . Rectal cancer Neg Hx   . Urticaria Neg Hx   . Eczema Neg Hx   . Immunodeficiency Neg Hx   . Atopy Neg Hx   . Asthma Neg Hx   . Angioedema Neg Hx   . Allergic rhinitis Neg Hx   . Dementia Neg Hx  none that she knows of   . Alzheimer's disease Neg Hx        none that she knows of   . Memory loss Neg Hx        none that she knows of     Social History   Tobacco Use  . Smoking status: Never Smoker  . Smokeless tobacco: Never Used  Vaping Use  . Vaping Use: Never used  Substance Use Topics  . Alcohol use: No  . Drug use: No    Home Medications Prior to Admission medications   Medication Sig Start Date End Date Taking? Authorizing Provider  albuterol (VENTOLIN HFA) 108 (90 Base) MCG/ACT inhaler USE 1 INHALATION BY MOUTH  EVERY 4 HOURS AS NEEDED FOR WHEEZING OR SHORTNESS OF  BREATH. 08/07/19    Collene Gobble, MD  aspirin 81 MG tablet Take 81 mg by mouth daily.    [provider]  aspirin-acetaminophen-caffeine (EXCEDRIN MIGRAINE) [phone] MG per tablet Take 1 tablet by mouth every 6 (six) hours as needed for pain.    [provider]  atorvastatin (LIPITOR) 40 MG tablet Take 1 tablet (40 mg total) by mouth daily. 02/16/20 05/16/20  Lorretta Harp, MD  Budeson-Glycopyrrol-Formoterol (BREZTRI AEROSPHERE) 160-9-4.8 MCG/ACT AERO Inhale two puffs twice daily to prevent cough or wheeze. Rinse mouth after use. 10/25/20   Kozlow, Donnamarie Poag, MD  Cholecalciferol (VITAMIN D-3) 5000 UNITS TABS Take 5,000 Units by mouth daily.    [provider]  famotidine (PEPCID) 40 MG tablet TAKE 1 TABLET BY MOUTH AT BEDTIME 11/25/20   Kozlow, Donnamarie Poag, MD  fenofibrate 160 MG tablet Take 160 mg by mouth daily.    [provider]  fluticasone (FLONASE) 50 MCG/ACT nasal spray USE 1 SPRAY INTO BOTH  NOSTRILS DAILY FOLLOWING NASAL [facility] 12/06/19   Kozlow, Donnamarie Poag, MD  ibuprofen (ADVIL,MOTRIN) 200 MG tablet Take 400 mg by mouth every 6 (six) hours as needed.    [provider]  loratadine (CLARITIN) 10 MG tablet Take 1 tablet (10 mg total) by mouth daily. 12/02/15   Collene Gobble, MD  losartan-hydrochlorothiazide (HYZAAR) 50-12.5 MG per tablet Take 1 tablet by mouth daily.    [provider]  metoCLOPramide (REGLAN) 10 MG tablet Take 10 mg by mouth 4 (four) times daily.    [provider]  Multiple Vitamin (MULTIVITAMIN) capsule Take 1 capsule by mouth daily.    [provider]  mupirocin nasal ointment (BACTROBAN NASAL) 2 % Place 1 application into the nose in the morning, at noon, and at bedtime. Use one-half of tube in each nostril twice daily for ten (10) days. After application, press sides of nose together and gently massage. 09/03/20   Kozlow, Donnamarie Poag, MD  mupirocin ointment (BACTROBAN) 2 % Place 1 application into the nose in the morning, at noon, and  at bedtime. Use one-half of tube in each nostril twice daily for ten (10) days. After application, press sides of nose together and gently massage. 09/05/20   Kozlow, Donnamarie Poag, MD  Naproxen Sod-Diphenhydramine (ALEVE PM PO) Take 1 tablet by mouth at bedtime as needed and may repeat dose one time if needed.    [provider]  niacin (NIASPAN) 1000 MG CR tablet Take 1,000 mg by mouth at bedtime. Patient not taking: Reported on 09/03/2020    [provider]  pantoprazole (PROTONIX) 40 MG tablet TAKE 1 TABLET BY MOUTH  TWICE DAILY 01/08/20   Kozlow, Donnamarie Poag, MD  Allergies    Patient has no known allergies.  Review of Systems   Review of Systems  Neurological: Positive for dizziness and weakness.  All other systems reviewed and are negative.   Physical Exam Updated Vital Signs BP (!) 174/97 (BP Location: Right Arm)   Pulse 71   Temp 98.9 F (37.2 C) (Oral)   Resp 18   SpO2 96%   Physical Exam Vitals and nursing note reviewed.  Constitutional:      General: She is not in acute distress.    Appearance: She is well-developed.  HENT:     Head: Atraumatic.  Eyes:     Extraocular Movements: Extraocular movements intact.     Conjunctiva/sclera: Conjunctivae normal.     Pupils: Pupils are equal, round, and reactive to light.  Cardiovascular:     Rate and Rhythm: Normal rate and regular rhythm.     Pulses: Normal pulses.     Heart sounds: Normal heart sounds.  Pulmonary:     Effort: Pulmonary effort is normal.     Breath sounds: Normal breath sounds.  Abdominal:     Palpations: Abdomen is soft.     Tenderness: There is no abdominal tenderness.  Musculoskeletal:        General: Normal range of motion.     Cervical back: Normal range of motion and neck supple. No rigidity.  Skin:    Findings: No rash.  Neurological:     Mental Status: She is alert and oriented to person, place, and time.     GCS: GCS eye subscore is 4. GCS verbal subscore is 5. GCS motor subscore  is 6.     Cranial Nerves: Cranial nerves are intact.     Sensory: Sensory deficit (Mild decrease sensation distal to left side of face arm and leg compared to right) present.     Motor: Weakness (Mild weakness to left arm and leg compared to right.) present.     Coordination: Coordination is intact.     Gait: Gait is intact.  Psychiatric:        Mood and Affect: Mood normal.     ED Results / Procedures / Treatments   Labs (all labs ordered are listed, but only abnormal results are displayed) Labs Reviewed  CBC - Abnormal; Notable for the following components:      Result Value   Hemoglobin 11.6 (*)    All other components within normal limits  URINALYSIS, ROUTINE W REFLEX MICROSCOPIC - Abnormal; Notable for the following components:   Color, Urine STRAW (*)    APPearance HAZY (*)    All other components within normal limits  DIFFERENTIAL - Abnormal; Notable for the following components:   Eosinophils Absolute 0.6 (*)    All other components within normal limits  COMPREHENSIVE METABOLIC PANEL - Abnormal; Notable for the following components:   BUN 24 (*)    All other components within normal limits  I-STAT CHEM 8, ED - Abnormal; Notable for the following components:   Potassium 3.3 (*)    All other components within normal limits  RESP PANEL BY RT-PCR (FLU A&B, COVID) ARPGX2  ETHANOL  PROTIME-INR  APTT  RAPID URINE DRUG SCREEN, HOSP PERFORMED  CBG MONITORING, ED    EKG None  ED ECG REPORT   Date: 12/23/2020  Rate: 67  Rhythm: normal sinus rhythm  QRS Axis: left  Intervals: normal  ST/T Wave abnormalities: normal  Conduction Disutrbances:incomplete RBBB  Narrative Interpretation:   Old EKG Reviewed: unchanged  I have personally reviewed the EKG tracing and agree with the computerized printout as noted.   Radiology No results found.  Procedures Procedures   Medications Ordered in ED Medications - No data to display  ED Course  I have reviewed the triage  vital signs and the nursing notes.  Pertinent labs & imaging results that were available during my care of the patient were reviewed by me and considered in my medical decision making (see chart for details).    MDM Rules/Calculators/A&P                          BP (!) 149/89 (BP Location: Left Arm)   Pulse 60   Temp 98.2 F (36.8 C) (Oral)   Resp 18   Ht 5\' 5"  (1.651 m)   Wt 72.6 kg   SpO2 98%   BMI 26.63 kg/m   Final Clinical Impression(s) / ED Diagnoses Final diagnoses:  Left hemiparesis ([place])  Dizziness    Rx / DC Orders ED Discharge Orders    None     5:52 PM Patient complaining of recurrent dizziness for more than a week and now having new left-sided tingling and weakness since yesterday.  She does have some subjective change in sensation on her left extremity compared to right.  Head CT scan unremarkable, will obtain brain MRI and check basic labs.  7:05 PM Patient would benefit from brain MRI however MRI is not available at [facility], ER at nighttime.  I have consulted on-call neurologist, Dr. Cheral Marker, who recommend patient to be transferred over to [facility], ER for brain MRI and if positive then to consult neurology for further care.  Patient made aware of plan, will notify ED provider for transfer.  9:22 PM Fortunately MRI was available and patient did receive a scan today.  MRI did not show any acute finding no evidence of acute stroke.  Work-up today has been essentially unremarkable.  UA without signs of urinary tract infection, normal UDS,No reviewed WBC, normal hemoglobin, electrolyte panels are reassuring, viral respiratory panel unremarkable, normal CBG,Head CT scan unremarkable  Patient ambulate without difficulty.  Encourage patient to follow-up outpatient for further work-up.  Neurology referral given as needed as well.  Patient was recently prescribed meclizine which I recommend using it as needed for her dizziness.  Return precaution given.  Normal  orthostatic vital signs otherwise.   Domenic Moras, PA-C 12/23/20 2126    Davonna Belling, MD 12/24/20 [phone]

## 2020-12-23 NOTE — ED Notes (Signed)
Patient transported to MRI 

## 2020-12-23 NOTE — Telephone Encounter (Signed)
Called patient and in agreement to see Dr. Gwenlyn Found in June moved appointment to 6/1 3:45 pm.  Verbalized understanding.

## 2020-12-23 NOTE — ED Triage Notes (Signed)
Emergency Medicine Provider Triage Evaluation Note  [name] , a 65 y.o. female  was evaluated in triage.  Pt complains of dizziness since April 21, went to PCP and was advised up with her neurologist and cardiologist but unable to be seen until July.  Noticed left-sided weakness yesterday.  Review of Systems  Positive: Unilateral weakness, altered sensation Negative: Chest pain, shortness of breath  Physical Exam  BP (!) 174/97 (BP Location: Right Arm)   Pulse 71   Temp 98.9 F (37.2 C) (Oral)   Resp 18   SpO2 96%  Gen:   Awake, no distress   HEENT:  Atraumatic  Resp:  Normal effort  Cardiac:  Normal rate  Abd:   Nondistended, nontender  MSK:   Weakness left arm, left leg, altered sensation to left side body Neuro:  Speech clear   Medical Decision Making  Medically screening exam initiated at 4:56 PM.  Appropriate orders placed.  [name] was informed that the remainder of the evaluation will be completed by another provider, this initial triage assessment does not replace that evaluation, and the importance of remaining in the ED until their evaluation is complete.  Clinical Impression     Tacy Learn, PA-C 12/23/20 1657

## 2020-12-23 NOTE — Discharge Instructions (Signed)
You have been evaluated for your symptoms.  Fortunately your brain MRI did not show any evidence of acute stroke.  Your labs are reassuring.  You may take antidizziness medication previously prescribed.  Call and follow-up closely with your doctor for further outpatient evaluation.  Return if you have any concern.

## 2020-12-23 NOTE — ED Triage Notes (Signed)
Pt reports dizziness since 4/21 and left sided weakness that began yesterday. A&O x4. Equal strength and grips on both sides. No facial droop present. Mild loss of sensation on left extremities and face.

## 2021-01-22 ENCOUNTER — Ambulatory Visit (INDEPENDENT_AMBULATORY_CARE_PROVIDER_SITE_OTHER): Payer: 59 | Admitting: Cardiovascular Disease

## 2021-01-22 ENCOUNTER — Encounter: Payer: Self-pay | Admitting: Cardiovascular Disease

## 2021-01-22 ENCOUNTER — Ambulatory Visit: Payer: 59 | Admitting: Medical

## 2021-01-22 ENCOUNTER — Ambulatory Visit: Payer: 59 | Admitting: Cardiovascular Disease

## 2021-01-22 ENCOUNTER — Other Ambulatory Visit: Payer: Self-pay

## 2021-01-22 VITALS — BP 138/84 | HR 71 | Ht 65.0 in | Wt 157.0 lb

## 2021-01-22 DIAGNOSIS — R0609 Other forms of dyspnea: Secondary | ICD-10-CM

## 2021-01-22 DIAGNOSIS — I10 Essential (primary) hypertension: Secondary | ICD-10-CM

## 2021-01-22 DIAGNOSIS — I2510 Atherosclerotic heart disease of native coronary artery without angina pectoris: Secondary | ICD-10-CM

## 2021-01-22 DIAGNOSIS — R0600 Dyspnea, unspecified: Secondary | ICD-10-CM | POA: Diagnosis not present

## 2021-01-22 DIAGNOSIS — E782 Mixed hyperlipidemia: Secondary | ICD-10-CM

## 2021-01-22 NOTE — Patient Instructions (Signed)
Medication Instructions:  Your physician recommends that you continue on your current medications as directed. Please refer to the Current Medication list given to you today.  *If you need a refill on your cardiac medications before your next appointment, please call your pharmacy*   Testing/Procedures: Your physician has requested that you have an echocardiogram. Echocardiography is a painless test that uses sound waves to create images of your heart. It provides your doctor with information about the size and shape of your heart and how well your heart's chambers and valves are working. This procedure takes approximately one hour. There are no restrictions for this procedure. This procedure is done at [address]rd Floor   Follow-Up: At [facility], you and your health needs are our priority.  As part of our continuing mission to provide you with exceptional heart care, we have created designated Provider Care Teams.  These Care Teams include your primary Cardiologist (physician) and Advanced Practice Providers (APPs -  Physician Assistants and Nurse Practitioners) who all work together to provide you with the care you need, when you need it.  We recommend signing up for the patient portal called "MyChart".  Sign up information is provided on this After Visit Summary.  MyChart is used to connect with patients for Virtual Visits (Telemedicine).  Patients are able to view lab/test results, encounter notes, upcoming appointments, etc.  Non-urgent messages can be sent to your provider as well.   To learn more about what you can do with MyChart, go to [email or web address].    Your next appointment:   12 month(s)  The format for your next appointment:   In Person  Provider:   Quay Burow, MD   Other Instructions Please keep a 30 day blood pressure log and return to review with a PharmD in 4 weeks.

## 2021-01-22 NOTE — Assessment & Plan Note (Signed)
History of hyperlipidemia on statin therapy with lipid profile performed 02/16/2020 revealing total cholesterol 135, LDL 73 and HDL 52.

## 2021-01-22 NOTE — Assessment & Plan Note (Signed)
History of essential hypertension blood pressure measured today 138/84.  She is on losartan hydrochlorothiazide and relatively low doses.  She says she checks her blood pressure at home and its usually in the 1 94-3 60 range systolic.  She does avoid salt.  I have asked her to keep a 30-day blood pressure log and she will see a Pharm.D. back in follow-up.  If her blood pressures are truly elevated in that range she may benefit from the addition of an additional antihypertensive medication such as low-dose amlodipine.

## 2021-01-22 NOTE — Progress Notes (Signed)
01/22/2021 [name]   1956/05/27  [id number]  Primary Physician [name], NP Primary Cardiologist: Lorretta Harp MD [name], [name]  HPI:  [name] is a 65 y.o.  fit appearing divorced African-American female mother of 2, grandmother of 67 grandchildren who works as an Librarian, academic at [employer]. She is currently working from home. She was referred by Dr. Lamonte Sakai for cardiovascular dilation because of coronary calcification found incidentally on chest CT performed 04/07/2019.  I last saw her in the office 02/16/2020.  Her risk factors include treated hypertension and hyperlipidemia. She is never smoked. There is no family history of heart disease. She is never had a heart attack or stroke. She denies chest pain or shortness of breath. She does have sarcoidosis which she has had since 1997. Her recent CT scan performed in August was unchanged from the one performed in January. She did have coronary calcification in the LAD and RCA seen on that CT scan however and was referred here for evaluation of this.  Based on her coronary calcification on chest CT I performed coronary calcium score on her 07/19/2019 which was 1178.  Based on this I increased her atorvastatin from 20 to 40 mg a day.  She is completely asymptomatic other than her baseline shortness of breath which she attributes to her sarcoidosis.  She does not smoke.  Since I saw her a year ago she has remained stable.  Dr. Lamonte Sakai continues to follow her ILD from stage IV sarcoid.  She does have cavitary changes.  She does walk a mile a day and is not limited by dyspnea.  She was seen in the ER last month with dizziness and decree sensation on her left side.  MRI of her brain was unrevealing.  The symptoms persist.  She has a appointment with a neurologist in 3 months.  I do not think the symptoms are cardiovascular nature.   Current Meds  Medication Sig  . albuterol (VENTOLIN HFA) 108 (90 Base)  MCG/ACT inhaler USE 1 INHALATION BY MOUTH  EVERY 4 HOURS AS NEEDED FOR WHEEZING OR SHORTNESS OF  BREATH. (Patient taking differently: Inhale 2 puffs into the lungs every 6 (six) hours as needed for wheezing or shortness of breath.)  . Ascorbic Acid (VITAMIN C) 1000 MG tablet Take 1,000 mg by mouth daily.  Marland Kitchen aspirin 81 MG tablet Take 81 mg by mouth daily.  Marland Kitchen aspirin-acetaminophen-caffeine (EXCEDRIN MIGRAINE) 250-250-65 MG per tablet Take 1 tablet by mouth every 6 (six) hours as needed for pain or migraine.  Marland Kitchen atorvastatin (LIPITOR) 40 MG tablet Take 1 tablet (40 mg total) by mouth daily.  . Budeson-Glycopyrrol-Formoterol (BREZTRI AEROSPHERE) 160-9-4.8 MCG/ACT AERO Inhale two puffs twice daily to prevent cough or wheeze. Rinse mouth after use. (Patient taking differently: Inhale 1 puff into the lungs 2 (two) times daily.)  . famotidine (PEPCID) 40 MG tablet TAKE 1 TABLET BY MOUTH AT BEDTIME (Patient taking differently: Take 40 mg by mouth daily.)  . fenofibrate 160 MG tablet Take 160 mg by mouth daily.  . fluticasone (FLONASE) 50 MCG/ACT nasal spray USE 1 SPRAY INTO BOTH  NOSTRILS DAILY FOLLOWING NASAL WASH (Patient taking differently: Place 1 spray into both nostrils daily as needed for allergies.)  . ibuprofen (ADVIL,MOTRIN) 200 MG tablet Take 400 mg by mouth every 6 (six) hours as needed for mild pain.  Marland Kitchen loratadine (CLARITIN) 10 MG tablet Take 1 tablet (10 mg total) by mouth daily.  Marland Kitchen  losartan-hydrochlorothiazide (HYZAAR) 50-12.5 MG per tablet Take 1 tablet by mouth daily.  . meclizine (ANTIVERT) 25 MG tablet Take 25 mg by mouth 3 (three) times daily as needed for dizziness.  . meloxicam (MOBIC) 7.5 MG tablet Take 7.5 mg by mouth daily.  . metoCLOPramide (REGLAN) 10 MG tablet Take 10 mg by mouth in the morning and at bedtime.  . Multiple Vitamin (MULTIVITAMIN) capsule Take 1 capsule by mouth daily.  . mupirocin nasal ointment (BACTROBAN NASAL) 2 % Place 1 application into the nose in the morning,  at noon, and at bedtime. Use one-half of tube in each nostril twice daily for ten (10) days. After application, press sides of nose together and gently massage.  . mupirocin ointment (BACTROBAN) 2 % Place 1 application into the nose in the morning, at noon, and at bedtime. Use one-half of tube in each nostril twice daily for ten (10) days. After application, press sides of nose together and gently massage.  . pantoprazole (PROTONIX) 40 MG tablet TAKE 1 TABLET BY MOUTH  TWICE DAILY (Patient taking differently: Take 40 mg by mouth 2 (two) times daily.)  . Vitamin D, Ergocalciferol, (DRISDOL) 1.25 MG (50000 UNIT) CAPS capsule Take 50,000 Units by mouth every 7 (seven) days.  Marland Kitchen zinc gluconate 50 MG tablet Take 50 mg by mouth daily.     No Known Allergies  Social History   Socioeconomic History  . Marital status: Divorced    Spouse name: Not on file  . Number of children: 2  . Years of education: college  . Highest education level: Not on file  Occupational History  . Occupation: CONSULTANT    Employer: [employer]  Tobacco Use  . Smoking status: Never Smoker  . Smokeless tobacco: Never Used  Vaping Use  . Vaping Use: Never used  Substance and Sexual Activity  . Alcohol use: No  . Drug use: No  . Sexual activity: Not Currently    Partners: Male  Other Topics Concern  . Not on file  Social History Narrative   Patient lives at home alone.  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  Cardiovascular:  negative for chest pain, dyspnea on exertion, edema, orthopnea, palpitations, paroxysmal nocturnal dyspnea or shortness of breath Dermatological: negative for rash Respiratory: negative for cough or wheezing Urologic: negative for hematuria Abdominal: negative for nausea, vomiting, diarrhea, bright red blood per rectum, melena, or hematemesis Neurologic: negative for visual changes, syncope, or dizziness All other systems reviewed and are otherwise negative except as noted above.    Blood pressure 138/84, pulse 71, height 5\' 5"  (1.651 m), weight 157 lb (71.2 kg).  General appearance: alert and no distress Neck: no adenopathy, no carotid bruit, no JVD, supple, symmetrical, trachea midline and thyroid not enlarged, symmetric, no tenderness/mass/nodules Lungs: clear to auscultation bilaterally Heart: regular rate and rhythm, S1, S2 normal, no murmur, click, rub or gallop Extremities: extremities normal, atraumatic, no cyanosis or edema Pulses:  2+ and symmetric Skin: Skin color, texture, turgor normal. No rashes or lesions Neurologic: Alert and oriented X 3, normal strength and tone. Normal symmetric reflexes. Normal coordination and gait  EKG not performed today  ASSESSMENT AND PLAN:   Essential hypertension History of essential hypertension blood pressure measured today 138/84.  She is on losartan hydrochlorothiazide and relatively low doses.  She says she checks her blood pressure at home and its usually in the 1 04-8 60 range systolic.  She does avoid salt.  I have asked her to keep a 30-day blood pressure log and she will see a Pharm.D. back in follow-up.  If her blood pressures are truly elevated in that range she may benefit from the addition of an additional antihypertensive medication such as low-dose amlodipine.  Hyperlipidemia History of hyperlipidemia on statin therapy with lipid profile performed 02/16/2020 revealing total cholesterol 135, LDL 73 and HDL 52.  Coronary artery  calcification seen on CT scan Coronary calcium seen on chest CT with coronary calcium score performed 07/19/2019 which was 1178.  She denies chest pain.  Based on this I did increase her atorvastatin dose from 20 to 40 mg a day.  We will continue to monitor her clinically.  At this point, I do not feel compelled to order a stress test.      Lorretta Harp MD [facility], [facility] 01/22/2021 8:25 AM

## 2021-01-22 NOTE — Assessment & Plan Note (Signed)
Coronary calcium seen on chest CT with coronary calcium score performed 07/19/2019 which was 1178.  She denies chest pain.  Based on this I did increase her atorvastatin dose from 20 to 40 mg a day.  We will continue to monitor her clinically.  At this point, I do not feel compelled to order a stress test.

## 2021-01-30 ENCOUNTER — Ambulatory Visit (INDEPENDENT_AMBULATORY_CARE_PROVIDER_SITE_OTHER)
Admission: RE | Admit: 2021-01-30 | Discharge: 2021-01-30 | Disposition: A | Discharge status: Home / Self Care | Payer: 59 | Source: Ambulatory Visit | Attending: Emergency Medicine | Admitting: Emergency Medicine

## 2021-01-30 ENCOUNTER — Other Ambulatory Visit: Payer: Self-pay

## 2021-01-30 DIAGNOSIS — D869 Sarcoidosis, unspecified: Secondary | ICD-10-CM | POA: Diagnosis not present

## 2021-02-17 ENCOUNTER — Ambulatory Visit (HOSPITAL_COMMUNITY): Payer: 59

## 2021-02-18 ENCOUNTER — Other Ambulatory Visit: Payer: Self-pay | Admitting: Allergy and Immunology

## 2021-02-19 ENCOUNTER — Telehealth: Payer: Self-pay | Admitting: *Deleted

## 2021-02-19 NOTE — Telephone Encounter (Signed)
PA has been submitted through CoverMyMeds for Famotidine 40mg  and is currently pending approval/denial.

## 2021-02-20 ENCOUNTER — Telehealth: Payer: Self-pay | Admitting: Cardiovascular Disease

## 2021-02-20 NOTE — Telephone Encounter (Signed)
Patient called to say that she lost the tracker to keep track of her bp. Not sure if she would need to reschedule her appt. Please advise

## 2021-02-20 NOTE — Telephone Encounter (Signed)
Called patient, advised that a message would be sent over to our pharmacy department as she was being seen on 07/05. She is unsure if she should still come. Advised that we would call her back and let her know.  Thank you.

## 2021-02-20 NOTE — Telephone Encounter (Signed)
Pa approved until 02/18/2022 pharmacy notified

## 2021-02-20 NOTE — Telephone Encounter (Signed)
Yes, recommend she keep appointment.  She can keep log through the weekend and bring in any past results she has.

## 2021-02-21 NOTE — Telephone Encounter (Signed)
Called patient, advised of message from PharmD.  Patient verbalized understanding.   

## 2021-02-25 ENCOUNTER — Other Ambulatory Visit: Payer: Self-pay

## 2021-02-25 ENCOUNTER — Ambulatory Visit (INDEPENDENT_AMBULATORY_CARE_PROVIDER_SITE_OTHER): Payer: 59 | Admitting: Pharmacist

## 2021-02-25 VITALS — BP 144/90 | HR 74 | Resp 14 | Ht 65.0 in | Wt 156.8 lb

## 2021-02-25 DIAGNOSIS — I10 Essential (primary) hypertension: Secondary | ICD-10-CM | POA: Diagnosis not present

## 2021-02-25 MED ORDER — AMLODIPINE BESYLATE 2.5 MG PO TABS: 2.5000 mg | ORAL_TABLET | Freq: Every day | ORAL | 2 refills | Status: DC

## 2021-02-25 NOTE — Progress Notes (Signed)
Patient ID: [name]                 DOB: [birth year]                      MRN: [id number]     HPI: [name] is a 65 y.o. female referred by Dr. Gwenlyn Found to HTN clinic. PMH is significant for HTN, HLD, CAD, and sarcoidosis.  At last visit with Dr Gwenlyn Found, BP was elevated at 138/84. Patient was instructed to keep BP log and have recheck in HTN clinic.  Patient presents today after first going to [facility] [facility].  Has multiple appointments with specialists this week including cardiology, GI, and neurology and is trying to balance them all.  Was seen in ER on 12/23/20 for dizziness.  MRI normal, however patient still feels dizzy.  Patient forgot her BP log today.  Thinks most of her readings are in the 140s/80s.  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  Has been hesitant to leave her house in the last years due to Covid. Has recently started leaving the house and going to church which makes her nervous due to history of sarcoidosis.  Appetite is good, does not add salt to food. Does not drink much caffeine. Avoids chocolate.    Is physically active. Walks up to 2 miles per day which usually takes her about an hour.  Current HTN meds: Losartan/HCTZ 50/12.5 BP goal: <130/80   Wt Readings from Last 3 Encounters:  01/22/21 157 lb (71.2 kg)  12/23/20 160 lb (72.6 kg)  08/05/20 165 lb 6.4 oz (75 kg)   BP Readings from Last 3 Encounters:  01/22/21 138/84  12/23/20 (!) 162/87  09/03/20 (!) 142/82   Pulse Readings from Last 3 Encounters:  01/22/21 71  12/23/20 (!) 53  09/03/20 69    Renal function: CrCl cannot be calculated (Patient's most recent lab result is older than the maximum 21 days allowed.).  Past Medical History:  Diagnosis Date   Angio-edema    Chronic sinusitis    Cough    Diabetes mellitus without complication ([place])    Diverticulosis 2009   mild sigmoid   High cholesterol    Hypertension    Recurrent upper respiratory  infection (URI)    Sarcoidosis    Vitamin D deficiency     Current Outpatient Medications on File Prior to Visit  Medication Sig Dispense Refill   albuterol (VENTOLIN HFA) 108 (90 Base) MCG/ACT inhaler USE 1 INHALATION BY MOUTH  EVERY 4 HOURS AS NEEDED FOR WHEEZING OR SHORTNESS OF  BREATH. (Patient taking differently: Inhale 2 puffs into the lungs every 6 (six) hours as needed for wheezing or shortness of breath.) 25.5 g 3   Ascorbic Acid (VITAMIN C) 1000 MG tablet Take 1,000 mg by mouth daily.     aspirin 81 MG tablet Take 81 mg by mouth daily.     aspirin-acetaminophen-caffeine (EXCEDRIN MIGRAINE) 250-250-65 MG per tablet Take 1 tablet by mouth every 6 (six) hours as needed for pain or migraine.     atorvastatin (LIPITOR) 40 MG tablet Take 1 tablet (40 mg total) by mouth daily. 90 tablet 3   Budeson-Glycopyrrol-Formoterol (BREZTRI AEROSPHERE) 160-9-4.8 MCG/ACT AERO Inhale two puffs twice daily to prevent cough or wheeze. Rinse mouth after use. (Patient taking differently: Inhale 1 puff into the lungs 2 (two) times daily.) 32.1 g 1   famotidine (PEPCID) 40 MG tablet TAKE 1 TABLET  BY MOUTH AT BEDTIME (Patient taking differently: Take 40 mg by mouth daily.) 30 tablet 5   fenofibrate 160 MG tablet Take 160 mg by mouth daily.     fluticasone (FLONASE) 50 MCG/ACT nasal spray USE 1 SPRAY INTO BOTH  NOSTRILS DAILY FOLLOWING NASAL WASH (Patient taking differently: Place 1 spray into both nostrils daily as needed for allergies.) 32 g 5   ibuprofen (ADVIL,MOTRIN) 200 MG tablet Take 400 mg by mouth every 6 (six) hours as needed for mild pain.     loratadine (CLARITIN) 10 MG tablet Take 1 tablet (10 mg total) by mouth daily. 90 tablet 0   losartan-hydrochlorothiazide (HYZAAR) 50-12.5 MG per tablet Take 1 tablet by mouth daily.     meclizine (ANTIVERT) 25 MG tablet Take 25 mg by mouth 3 (three) times daily as needed for dizziness.     meloxicam (MOBIC) 7.5 MG tablet Take 7.5 mg by mouth daily.      metoCLOPramide (REGLAN) 10 MG tablet Take 10 mg by mouth in the morning and at bedtime.     Multiple Vitamin (MULTIVITAMIN) capsule Take 1 capsule by mouth daily.     mupirocin nasal ointment (BACTROBAN NASAL) 2 % Place 1 application into the nose in the morning, at noon, and at bedtime. Use one-half of tube in each nostril twice daily for ten (10) days. After application, press sides of nose together and gently massage. 50 g 0   mupirocin ointment (BACTROBAN) 2 % Place 1 application into the nose in the morning, at noon, and at bedtime. Use one-half of tube in each nostril twice daily for ten (10) days. After application, press sides of nose together and gently massage. 22 g 0   pantoprazole (PROTONIX) 40 MG tablet TAKE 1 TABLET BY MOUTH  TWICE DAILY (Patient taking differently: Take 40 mg by mouth 2 (two) times daily.) 180 tablet 3   Vitamin D, Ergocalciferol, (DRISDOL) 1.25 MG (50000 UNIT) CAPS capsule Take 50,000 Units by mouth every 7 (seven) days.     zinc gluconate 50 MG tablet Take 50 mg by mouth daily.     No current facility-administered medications on file prior to visit.    No Known Allergies   Assessment/Plan:  1. Hypertension -  Patient BP in room 140/96 (recheck 144/90) which is above goal of <130/80.  Could be elevated due to patient;s anxiety about being out of house. However, patient has unresolved dizziness also.  Has neuro appointment next week.  Recommend patient continue to check BP at home and will add on low dose amlodipine to regimen.  Counseled patient on possible side effect.  Will start at 2.5 and likely need to increase to 5mg .  Will recheck in 1 month.  Continue losartan/HCTZ 50/12.5mg  daily Start amlodipine 2.5mg  daily Recheck in 4 weeks  Karren Cobble, PharmD, BCACP, [place], [place] [address] [place], [place] [address] Phone: [phone]; Fax: [phone] 02/25/2021 4:37 PM

## 2021-02-25 NOTE — Patient Instructions (Addendum)
It was nice meeting you today  We would like to have your blood pressure less than 130/80  Please continue your losartan/hydrochlorothiazide 50/12.5mg  once a day  We are going to start a new medication called amlodipine 2.5mg  once a day  Continue your healthy eating and exercise  We will see you back in 4 weeks  Karren Cobble, PharmD, BCACP, [place], [place] [address] [place], [place] [address] Phone: [phone]; Fax: [phone] 02/25/2021 3:20 PM

## 2021-02-26 ENCOUNTER — Ambulatory Visit: Payer: 59 | Admitting: Cardiovascular Disease

## 2021-02-27 ENCOUNTER — Ambulatory Visit: Payer: 59 | Admitting: Gastroenterology

## 2021-03-04 ENCOUNTER — Encounter: Payer: Self-pay | Admitting: Allergy and Immunology

## 2021-03-04 ENCOUNTER — Ambulatory Visit (INDEPENDENT_AMBULATORY_CARE_PROVIDER_SITE_OTHER): Payer: 59 | Admitting: Allergy and Immunology

## 2021-03-04 ENCOUNTER — Other Ambulatory Visit: Payer: Self-pay

## 2021-03-04 VITALS — BP 130/76 | HR 76 | Temp 97.7°F | Resp 16 | Ht 65.0 in | Wt 160.0 lb

## 2021-03-04 DIAGNOSIS — J4540 Moderate persistent asthma, uncomplicated: Secondary | ICD-10-CM | POA: Diagnosis not present

## 2021-03-04 DIAGNOSIS — J3089 Other allergic rhinitis: Secondary | ICD-10-CM

## 2021-03-04 DIAGNOSIS — D860 Sarcoidosis of lung: Secondary | ICD-10-CM

## 2021-03-04 DIAGNOSIS — J849 Interstitial pulmonary disease, unspecified: Secondary | ICD-10-CM | POA: Diagnosis not present

## 2021-03-04 MED ORDER — BREZTRI AEROSPHERE 160-9-4.8 MCG/ACT IN AERO: INHALATION_SPRAY | RESPIRATORY_TRACT | 1 refills | Status: DC

## 2021-03-04 MED ORDER — FAMOTIDINE 40 MG PO TABS: 40.0000 mg | ORAL_TABLET | Freq: Every day | ORAL | 5 refills | Status: DC

## 2021-03-04 MED ORDER — PANTOPRAZOLE SODIUM 40 MG PO TBEC: DELAYED_RELEASE_TABLET | ORAL | 3 refills | Status: DC

## 2021-03-04 MED ORDER — BACTROBAN NASAL 2 % NA OINT
1.0000 | TOPICAL_OINTMENT | Freq: Three times a day (TID) | NASAL | 0 refills | Status: DC
Start: 2021-03-04 — End: 2021-09-09

## 2021-03-04 MED ORDER — FLUTICASONE PROPIONATE 50 MCG/ACT NA SUSP: NASAL | 5 refills | Status: DC

## 2021-03-04 NOTE — Progress Notes (Signed)
[place]   Follow-up Note  Referring Provider: [name], NP Primary Provider: [name], NP Date of Office Visit: 03/04/2021  Subjective:   [name] (DOB: 1956-07-23) is a 65 y.o. female who returns to the [facility] [place] on 03/04/2021 in re-evaluation of the following:  HPI: Tianna returns to this clinic in reevaluation of cough with contribution from asthma, LPR, sarcoidosis with interstitial lung disease/fibrosis.  Her last visit to this clinic was 03 September 2020.  Overall she feels as though she is doing relatively well at this point in time.  She has her coughing under better control.  It is by no means completely gone but she feels that overall while using her medications and doing some behavioral issues regarding how she eats she has good control of her cough.  She does not need to use a short acting bronchodilator at this point in time.  She is walking 1 mile per day.  She has had very little problems with her nose.  When I last saw her in this clinic she appeared to have a very excoriated bleeding nose for which we gave her Bactroban to use and had her decrease her use of nasal fluticasone and this appears to work quite well.  Since her last visit she has had 2 upper respiratory tract infections requiring the administration of a systemic steroid and antibiotic administered by either her urgent care center or her primary care doctor.  She apparently has some type of CNS event with vertigo and some left-sided weakness with an apparent normal MRI of her head obtained in the emergency room setting earlier this spring and she is scheduled to see a neurologist concerning this issue.  She has had 4 COVID vaccines.  Allergies as of 03/04/2021   No Known Allergies      Medication List    albuterol 108 (90 Base) MCG/ACT inhaler Commonly known as: VENTOLIN HFA USE 1 INHALATION BY MOUTH  EVERY 4  HOURS AS NEEDED FOR WHEEZING OR SHORTNESS OF  BREATH.   amLODipine 2.5 MG tablet Commonly known as: NORVASC Take 1 tablet (2.5 mg total) by mouth daily.   aspirin 81 MG tablet Take 81 mg by mouth daily.   aspirin-acetaminophen-caffeine 250-250-65 MG tablet Commonly known as: EXCEDRIN MIGRAINE Take 1 tablet by mouth every 6 (six) hours as needed for pain or migraine.   atorvastatin 40 MG tablet Commonly known as: LIPITOR Take 1 tablet (40 mg total) by mouth daily.   Bactroban Nasal 2 % Generic drug: mupirocin nasal ointment Place 1 application into the nose in the morning, at noon, and at bedtime. Use one-half of tube in each nostril twice daily for ten (10) days. After application, press sides of nose together and gently massage.   Breztri Aerosphere 160-9-4.8 MCG/ACT Aero Generic drug: Budeson-Glycopyrrol-Formoterol Inhale two puffs twice daily to prevent cough or wheeze. Rinse mouth after use.   famotidine 40 MG tablet Commonly known as: PEPCID TAKE 1 TABLET BY MOUTH AT BEDTIME   fenofibrate 160 MG tablet Take 160 mg by mouth daily.   fluticasone 50 MCG/ACT nasal spray Commonly known as: FLONASE USE 1 SPRAY INTO BOTH  NOSTRILS DAILY FOLLOWING NASAL WASH   ibuprofen 200 MG tablet Commonly known as: ADVIL Take 400 mg by mouth every 6 (six) hours as needed for mild pain.   loratadine 10 MG tablet Commonly known as: CLARITIN Take 1 tablet (10 mg total) by mouth  daily.   losartan-hydrochlorothiazide 50-12.5 MG tablet Commonly known as: HYZAAR Take 1 tablet by mouth daily.   meclizine 25 MG tablet Commonly known as: ANTIVERT Take 25 mg by mouth 3 (three) times daily as needed for dizziness.   meloxicam 7.5 MG tablet Commonly known as: MOBIC Take 7.5 mg by mouth daily.   metoCLOPramide 10 MG tablet Commonly known as: REGLAN Take 10 mg by mouth in the morning and at bedtime.   multivitamin capsule Take 1 capsule by mouth daily.   pantoprazole 40 MG  tablet Commonly known as: PROTONIX TAKE 1 TABLET BY MOUTH  TWICE DAILY   vitamin C 1000 MG tablet Take 1,000 mg by mouth daily.   Vitamin D (Ergocalciferol) 1.25 MG (50000 UNIT) Caps capsule Commonly known as: DRISDOL Take 50,000 Units by mouth every 7 (seven) days.   zinc gluconate 50 MG tablet Take 50 mg by mouth daily.        Past Medical History:  Diagnosis Date   Angio-edema    Chronic sinusitis    Cough    Diabetes mellitus without complication ([place])    Diverticulosis 2009   mild sigmoid   High cholesterol    Hypertension    Recurrent upper respiratory infection (URI)    Sarcoidosis    Vitamin D deficiency     Past Surgical History:  Procedure Laterality Date   COLONOSCOPY     LYMPHADENECTOMY     NOSE SURGERY     SINOSCOPY     TUBAL LIGATION     VESICOVAGINAL FISTULA CLOSURE W/ TAH     VIDEO BRONCHOSCOPY Bilateral 07/29/2017   Procedure: VIDEO BRONCHOSCOPY WITH FLUORO;  Surgeon: Collene Gobble, MD;  Location: [facility];  Service: Cardiopulmonary;  Laterality: Bilateral;    Review of systems negative except as noted in HPI / PMHx or noted below:  Review of Systems  Constitutional: Negative.   HENT: Negative.    Eyes: Negative.   Respiratory: Negative.    Cardiovascular: Negative.   Gastrointestinal: Negative.   Genitourinary: Negative.   Musculoskeletal: Negative.   Skin: Negative.   Neurological: Negative.   Endo/Heme/Allergies: Negative.   Psychiatric/Behavioral: Negative.      Objective:   Vitals:   03/04/21 1639  BP: 130/76  Pulse: 76  Resp: 16  Temp: 97.7 F (36.5 C)  SpO2: 96%   Height: 5\' 5"  (165.1 cm)  Weight: 160 lb (72.6 kg)   Physical Exam Constitutional:      Appearance: She is not diaphoretic.  HENT:     Head: Normocephalic.     Right Ear: Tympanic membrane, ear canal and external ear normal.     Left Ear: Tympanic membrane, ear canal and external ear normal.     Nose: Nose normal. No mucosal edema or rhinorrhea.      Mouth/Throat:     Pharynx: Uvula midline. No oropharyngeal exudate.  Eyes:     Conjunctiva/sclera: Conjunctivae normal.  Neck:     Thyroid: No thyromegaly.     Trachea: Trachea normal. No tracheal tenderness or tracheal deviation.  Cardiovascular:     Rate and Rhythm: Normal rate and regular rhythm.     Heart sounds: Normal heart sounds, S1 normal and S2 normal. No murmur heard. Pulmonary:     Effort: No respiratory distress.     Breath sounds: Normal breath sounds. No stridor. No wheezing or rales.  Lymphadenopathy:     Head:     Right side of head: No tonsillar adenopathy.  Left side of head: No tonsillar adenopathy.     Cervical: No cervical adenopathy.  Skin:    Findings: No erythema or rash.     Nails: There is no clubbing.  Neurological:     Mental Status: She is alert.    Diagnostics:    Spirometry was performed and demonstrated an FEV1 of 1.44 at 67 % of predicted.  Results of MRI head obtained 23 Dec 2020 identified the following:  Sinuses/Orbits:No paranasal sinus fluid levels or advanced mucosal thickening. No mastoid or middle ear effusion. Normal orbits.  Assessment and Plan:   1. Asthma, moderate persistent, well-controlled   2. Sarcoidosis of lung ([place])   3. Interstitial lung disease ([place])   4. Perennial allergic rhinitis     1.  Continue to perform allergen avoidance measures - house dust mite  2.  Continue to treat inflammation of airway:   A.  Breztri - 2 inhalations 2 times a day  B.  Flonase 1 spray each nostril only 1 time per day.      3.  Continue to treat reflux:   A.  Eliminate use of fish oil, caffeine, chocolate  B.  Raise head of bed  C.  No late meals  D.  Pantoprazole 40 mg twice a day  E.  Famotidine 40 mg in evening  F.  Metoclopramide 10 mg 2 times per day  5.  If needed:   A.  OTC antihistamine   B.  Albuterol HFA-2 inhalations once a day  6. Return to clinic in 6 months or earlier if problem  7.  Obtain fall flu  vaccine  Vikkie appears to be doing okay regarding her cough and other respiratory tract symptoms while treating inflammation of her airway and treating reflux induced respiratory disease as noted above.  She has required the administration of systemic steroids on 2 occasions this spring and I did have a talk with her today about evaluation for cataracts and osteoporosis and she will discuss this issue with her primary care doctor.  We will keep her on the plan noted above and see her back in this clinic in 6 months or earlier if there is a problem.  Allena Katz, MD Allergy / [facility] [place]

## 2021-03-04 NOTE — Patient Instructions (Addendum)
  1.  Continue to perform allergen avoidance measures - house dust mite  2.  Continue to treat inflammation of airway:   A.  Breztri - 2 inhalations 2 times a day  B.  Flonase 1 spray each nostril only 1 time per day.      3.  Continue to treat reflux:   A.  Eliminate use of fish oil, caffeine, chocolate  B.  Raise head of bed  C.  No late meals  D.  Pantoprazole 40 mg twice a day  E.  Famotidine 40 mg in evening  F.  Metoclopramide 10 mg 2 times per day  5.  If needed:   A.  OTC antihistamine   B.  Albuterol HFA-2 inhalations once a day  6. Return to clinic in 6 months or earlier if problem  7.  Obtain fall flu vaccine

## 2021-03-05 ENCOUNTER — Encounter: Payer: Self-pay | Admitting: Allergy and Immunology

## 2021-03-05 ENCOUNTER — Ambulatory Visit (HOSPITAL_COMMUNITY): Payer: 59 | Attending: Cardiology

## 2021-03-05 DIAGNOSIS — R0609 Other forms of dyspnea: Secondary | ICD-10-CM

## 2021-03-05 DIAGNOSIS — R0600 Dyspnea, unspecified: Secondary | ICD-10-CM | POA: Diagnosis present

## 2021-03-05 DIAGNOSIS — I10 Essential (primary) hypertension: Secondary | ICD-10-CM | POA: Insufficient documentation

## 2021-03-05 LAB — ECHOCARDIOGRAM COMPLETE
Area-P 1/2: 2.23 cm2
S' Lateral: 2.4 cm

## 2021-03-06 ENCOUNTER — Ambulatory Visit (INDEPENDENT_AMBULATORY_CARE_PROVIDER_SITE_OTHER): Payer: 59 | Admitting: Neurology

## 2021-03-06 VITALS — BP 137/92 | HR 72 | Ht 65.0 in | Wt 160.0 lb

## 2021-03-06 DIAGNOSIS — R42 Dizziness and giddiness: Secondary | ICD-10-CM | POA: Diagnosis not present

## 2021-03-06 MED ORDER — METHYLPREDNISOLONE 4 MG PO TBPK: ORAL_TABLET | ORAL | 1 refills | Status: DC

## 2021-03-06 NOTE — Progress Notes (Signed)
[facility]    Provider:  Dr Jaynee Eagles Requesting Provider: Shanon Rosser, PA-C Primary Care Provider:  [name], NP  CC:  Dizziness  03/06/2021: In April she woke up she was so dizzy she couldn't walk, a week or so later she started felling weak on her left side, She is still having the dizziness, not nearly as bad, she had her blood pressure managed, she still has a weird feeling in her head, she just feels weird. When it started room wasn't spinning, it felt like if she moved she would fall, there was no sensation of movement but she felt off balance. There was no trigger, moving made it worse, sitting still was better but a feeling that she can't describe, nothing new, no new medications, no new foods. She went to her pcp, she went to cardiology, she had a complete physical. She is feeling better but still feels a little tingly and weak on the left side of the body but that has ostly resolved now it is mostly a tingly sensation in her hands and arms and legs on the left side only. Her head still feels weird, not back to normal. No triggers. Nothing makes it better. No other focal neurologic deficits, associated symptoms, inciting events or modifiable factors.   MRI brain 12/23/2020 wo contrast: showed No acute intracranial abnormalities including mass lesion or mass effect, hydrocephalus, extra-axial fluid collection, midline shift, hemorrhage, or acute infarction, large ischemic events (personally reviewed images). Does show mild white matter changes, chronic small vessel ischemia within normal limits for age.     HPI 09/28/2019:  KARESHA TRZCINSKI is a 65 y.o. female here as requested by Shanon Rosser, PA-C for Memory impairment.  Past medical history sarcoidosis, recurrent upper respiratory infection, hypertension, diabetes, sinusitis chronically, hereditary and idiopathic peripheral neuropathy.  I reviewed [name] notes, patient last had labs taken August 03, 2019, creatinine 0.95, BUN 20, otherwise CMP normal, lipid panel shows cholesterol total 180, LDL 102, HDL 63, hemoglobin A1c 6.5, vitamin B12 1937, TSH normal 1.14, rheumatoid factor less than 14, ANA negative, CBC normal, sed rate 14 normal, I reviewed examination which was normal including psychiatric, ear nose throat, lungs, musculoskeletal, cardiovascular, memory impairment. Patient is here alone, memory problems going on for years. No family history of dementia. She has normally been very sharp, when she was young, 10 years ago she started noticing she wasn't as sharp, she works at [employer], she had to take more notes, things she cant remember like a license, not quickly progressing. She manages day to day, not distractable or impatient. She sometimes can;t find the right word to get out and has to use another word. No accidents in the home, she pays her own bills and takes her medication independently, still working sucessfully. No other focal neurologic deficits, associated symptoms, inciting events or modifiable factors.  Reviewed notes, labs and imaging from outside physicians, which showed:  MRI 2014 reviewed images and agree with the following:  Equivocal MRI brain (with and without) demonstrating: 1. Few punctate foci of non-specific gliosis. 2. No acute findings.  Review of Systems: Patient complains of symptoms per HPI as well as the following symptoms: subjective memory complaints . Pertinent negatives and positives per HPI. All others negative    Social History   Socioeconomic History   Marital status: Divorced    Spouse name: Not on file   Number of children: 2   Years of education: college   Highest education level:  Not on file  Occupational History   Occupation: CONSULTANT    Employer: [employer]  Tobacco Use   Smoking status: Never   Smokeless tobacco: Never  Vaping Use   Vaping Use: Never used  Substance and Sexual Activity   Alcohol use: No    Drug use: No   Sexual activity: Not Currently    Partners: Male  Other Topics Concern   Not on file  Social History Narrative   Patient lives at home alone.  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 level of education:  [employer]    2 children   Never smoker   No drug use   Caffeine use: decaf   Social Determinants of Radio broadcast assistant Strain: Not on file  Food Insecurity: Not on file  Transportation Needs: Not on file  Physical Activity: Not on file  Stress: Not on file  Social Connections: Not on file  Intimate Partner Violence: Not on file    Family History  Problem Relation Age of Onset   Stroke Father    Heart murmur Father    Diabetes Father    Emphysema Mother    COPD Mother    Breast cancer Sister    Colon cancer Neg Hx    Esophageal cancer Neg Hx    Rectal cancer Neg Hx    Urticaria Neg Hx    Eczema Neg Hx    Immunodeficiency Neg Hx    Atopy Neg Hx    Asthma Neg Hx    Angioedema Neg Hx    Allergic rhinitis Neg Hx    Dementia Neg Hx        none that she knows of    Alzheimer's disease Neg Hx        none that she knows of    Memory loss Neg Hx        none that she knows of     Past Medical History:  Diagnosis Date   Angio-edema    Chronic sinusitis    Cough    Diabetes mellitus without complication ([place])    Diverticulosis 2009   mild sigmoid   High cholesterol    Hypertension    Recurrent upper respiratory infection (URI)    Sarcoidosis    Vitamin D deficiency     Patient Active Problem List   Diagnosis Date Noted   Essential hypertension 07/19/2019   Hyperlipidemia 07/19/2019   Coronary artery calcification seen on CT scan 07/19/2019   Globus pharyngeus 03/07/2019   Nasal septal perforation 03/07/2019   Post-nasal drip 02/22/2019   Colon cancer screening 02/22/2019   Diastasis recti 02/22/2019   Interstitial pneumonia ([place]) suspected on CT scan 01/02/2019   Abnormal finding on lung imaging  10/28/2018   Gastroesophageal reflux disease without esophagitis 10/27/2018   Mycetoma 06/22/2016   Sarcoid arthropathy ([place]) 01/08/2016   Hereditary and idiopathic peripheral neuropathy 05/20/2014   Anal condyloma 08/03/2013   Rhinitis, chronic 09/15/2011   Cough, persistent 06/16/2010   Sarcoidosis 07/10/2008    Past Surgical History:  Procedure Laterality Date   COLONOSCOPY     LYMPHADENECTOMY     NOSE SURGERY     SINOSCOPY     TUBAL LIGATION     VESICOVAGINAL FISTULA CLOSURE W/ TAH     VIDEO BRONCHOSCOPY Bilateral 07/29/2017   Procedure: VIDEO BRONCHOSCOPY WITH FLUORO;  Surgeon: Collene Gobble, MD;  Location: [facility];  Service: Cardiopulmonary;  Laterality: Bilateral;    Current  Outpatient Medications  Medication Sig Dispense Refill   albuterol (VENTOLIN HFA) 108 (90 Base) MCG/ACT inhaler USE 1 INHALATION BY MOUTH  EVERY 4 HOURS AS NEEDED FOR WHEEZING OR SHORTNESS OF  BREATH. (Patient taking differently: Inhale 2 puffs into the lungs every 6 (six) hours as needed for wheezing or shortness of breath.) 25.5 g 3   amLODipine (NORVASC) 2.5 MG tablet Take 1 tablet (2.5 mg total) by mouth daily. 30 tablet 2   Ascorbic Acid (VITAMIN C) 1000 MG tablet Take 1,000 mg by mouth daily.     aspirin 81 MG tablet Take 81 mg by mouth daily.     aspirin-acetaminophen-caffeine (EXCEDRIN MIGRAINE) 250-250-65 MG per tablet Take 1 tablet by mouth every 6 (six) hours as needed for pain or migraine.     atorvastatin (LIPITOR) 40 MG tablet Take 1 tablet (40 mg total) by mouth daily. 90 tablet 3   Budeson-Glycopyrrol-Formoterol (BREZTRI AEROSPHERE) 160-9-4.8 MCG/ACT AERO Inhale two puffs twice daily to prevent cough or wheeze. Rinse mouth after use. 32.1 g 1   famotidine (PEPCID) 40 MG tablet Take 1 tablet (40 mg total) by mouth at bedtime. 30 tablet 5   fenofibrate 160 MG tablet Take 160 mg by mouth daily.     fluticasone (FLONASE) 50 MCG/ACT nasal spray USE 1 SPRAY INTO BOTH  NOSTRILS DAILY  FOLLOWING NASAL WASH 32 g 5   ibuprofen (ADVIL,MOTRIN) 200 MG tablet Take 400 mg by mouth every 6 (six) hours as needed for mild pain.     loratadine (CLARITIN) 10 MG tablet Take 1 tablet (10 mg total) by mouth daily. 90 tablet 0   losartan-hydrochlorothiazide (HYZAAR) 50-12.5 MG per tablet Take 1 tablet by mouth daily.     meclizine (ANTIVERT) 25 MG tablet Take 25 mg by mouth 3 (three) times daily as needed for dizziness.     meloxicam (MOBIC) 7.5 MG tablet Take 7.5 mg by mouth daily.     methylPREDNISolone (MEDROL DOSEPAK) 4 MG TBPK tablet Take pills in the morning with food x 6 days 21 tablet 1   metoCLOPramide (REGLAN) 10 MG tablet Take 10 mg by mouth in the morning and at bedtime.     Multiple Vitamin (MULTIVITAMIN) capsule Take 1 capsule by mouth daily.     mupirocin nasal ointment (BACTROBAN NASAL) 2 % Place 1 application into the nose in the morning, at noon, and at bedtime. Use one-half of tube in each nostril twice daily for ten (10) days. After application, press sides of nose together and gently massage. 50 g 0   pantoprazole (PROTONIX) 40 MG tablet TAKE 1 TABLET BY MOUTH  TWICE DAILY 180 tablet 3   Vitamin D, Ergocalciferol, (DRISDOL) 1.25 MG (50000 UNIT) CAPS capsule Take 50,000 Units by mouth every 7 (seven) days.     zinc gluconate 50 MG tablet Take 50 mg by mouth daily.     No current facility-administered medications for this visit.    Allergies as of 03/06/2021   (No Known Allergies)    Vitals: BP (!) 137/92 (BP Location: Right Arm, Patient Position: Sitting)   Pulse 72   Ht 5\' 5"  (1.651 m)   Wt 160 lb (72.6 kg) Comment: pt reported  BMI 26.63 kg/m  Last Weight:  Wt Readings from Last 1 Encounters:  03/06/21 160 lb (72.6 kg)   Last Height:   Ht Readings from Last 1 Encounters:  03/06/21 5\' 5"  (1.651 m)    Physical exam: Exam: Gen: NAD, conversant, well nourised, well groomed  CV: RRR, no MRG. No Carotid Bruits. No peripheral edema, warm,  nontender Eyes: Conjunctivae clear without exudates or hemorrhage  Neuro: Detailed Neurologic Exam  Speech:    Speech is normal; fluent and spontaneous with normal comprehension.  Cognition:    The patient is oriented to person, place, and time;     recent and remote memory intact;     language fluent;     normal attention, concentration,     fund of knowledge Cranial Nerves:    The pupils are equal, round, and reactive to light. The fundi are flatt. Visual fields are full to finger confrontation. Extraocular movements are intact. Trigeminal sensation is intact and the muscles of mastication are normal. The face is symmetric. The palate elevates in the midline. Hearing intact. Voice is normal. Shoulder shrug is normal. The tongue has normal motion without fasciculations.   Coordination:    Normal finger to nose and heel to shin.  Gait:    Heel-toe and tandem gait are normal.   Motor Observation:    No asymmetry, no atrophy, and no involuntary movements noted. Tone:    Normal muscle tone.    Posture:    Posture is normal. normal erect    Strength:    Strength is V/V in the upper and lower limbs.      Sensation: intact to LT. Romberg negative.     Reflex Exam:  DTR's:    Deep tendon reflexes in the upper and lower extremities are symmetrical bilaterally.   Toes:    The toes are downgoing bilaterally.   Clonus:    Clonus is absent.   Assessment/Plan:  65 year old with acute onset dizziness/vertigo upon waking likely peripheral. MRI brain WO normal. Neurologic exam normal including tandem gait without problems. She denies headaches/migraines.   - Referral to ENT: She has wax in ears left > right, unclear if this is contributory, cannot see her TMs, will send to ENT for eval of this and vertigo to her established ENT -Offered repeat mri w/wo contrast (with thin cuts through IAC) and blood vessel imaging but she declines right now - Will order Physical/Vestibular therapy -  A course of steroids in case she has labyrinthitis, will see if helps  Orders Placed This Encounter  Procedures   Ambulatory referral to ENT   Ambulatory referral to Physical Therapy   Meds ordered this encounter  Medications   methylPREDNISolone (MEDROL DOSEPAK) 4 MG TBPK tablet    Sig: Take pills in the morning with food x 6 days    Dispense:  21 tablet    Refill:  1      Cc: Long, Scott, PA-C,    Sarina Ill, MD  [facility] [address] [place], [place] [address]  Phone [phone] Fax [phone] I spent over 40 minutes of face-to-face and non-face-to-face time with patient on the  1. Dizziness   2. Vertigo    diagnosis.  This included previsit chart review, lab review, study review, order entry, electronic health record documentation, patient education on the different diagnostic and therapeutic options, counseling and coordination of care, risks and benefits of management, compliance, or risk factor reduction

## 2021-03-06 NOTE — Patient Instructions (Addendum)
Medrol dosepak  Benign Positional Vertigo Vertigo is the feeling that you or your surroundings are moving when they are not. Benign positional vertigo is the most common form of vertigo. This is usually a harmless condition (benign). This condition is positional. This means that symptoms are triggered bycertain movements and positions. This condition can be dangerous if it occurs while you are doing something that could cause harm to yourself or others. This includes activities such asdriving or operating machinery. What are the causes? The inner ear has fluid-filled canals that help your brain sense movement and balance. When the fluid moves, the brain receives messages about your body'sposition. With benign positional vertigo, calcium crystals in the inner ear break free and disturb the inner ear area. This causes your brain to receive confusingmessages about your body's position. What increases the risk? You are more likely to develop this condition if: You are a woman. You are 52 years of age or older. You have recently had a head injury. You have an inner ear disease. What are the signs or symptoms? Symptoms of this condition usually happen when you move your head or your eyes in different directions. Symptoms may start suddenly and usually last for less than a minute. They include: Loss of balance and falling. Feeling like you are spinning or moving. Feeling like your surroundings are spinning or moving. Nausea and vomiting. Blurred vision. Dizziness. Involuntary eye movement (nystagmus). Symptoms can be mild and cause only minor problems, or they can be severe and interfere with daily life. Episodes of benign positional vertigo may return (recur) over time. Symptoms may also improve over time. How is this diagnosed? This condition may be diagnosed based on: Your medical history. A physical exam of the head, neck, and ears. Positional tests to check for or stimulate vertigo. You may  be asked to turn your head and change positions, such as going from sitting to lying down. A health care provider will watch for symptoms of vertigo. You may be referred to a health care provider who specializes in ear, nose, and throat problems (ENT or otolaryngologist) or a provider who specializes in disorders of the nervous system (neurologist). How is this treated?  This condition may be treated in a session in which your health care provider moves your head in specific positions to help the displaced crystals in your inner ear move. Treatment for this condition may take several sessions. Surgerymay be needed in severe cases, but this is rare. In some cases, benign positional vertigo may resolve on its own in 2-4 weeks. Follow these instructions at home: Safety Move slowly. Avoid sudden body or head movements or certain positions, as told by your health care provider. Avoid driving or operating machinery until your health care provider says it is safe. Avoid doing any tasks that would be dangerous to you or others if vertigo occurs. If you have trouble walking or keeping your balance, try using a cane for stability. If you feel dizzy or unstable, sit down right away. Return to your normal activities as told by your health care provider. Ask your health care provider what activities are safe for you. General instructions Take over-the-counter and prescription medicines only as told by your health care provider. Drink enough fluid to keep your urine pale yellow. Keep all follow-up visits. This is important. Contact a health care provider if: You have a fever. Your condition gets worse or you develop new symptoms. Your family or friends notice any behavioral changes. You have  nausea or vomiting that gets worse. You have numbness or a prickling and tingling sensation. Get help right away if you: Have difficulty speaking or moving. Are always dizzy or faint. Develop severe headaches. Have  weakness in your legs or arms. Have changes in your hearing or vision. Develop a stiff neck. Develop sensitivity to light. These symptoms may represent a serious problem that is an emergency. Do not wait to see if the symptoms will go away. Get medical help right away. Call your local emergency services (911 in the [place] Do not drive yourself to the hospital. Summary Vertigo is the feeling that you or your surroundings are moving when they are not. Benign positional vertigo is the most common form of vertigo. This condition is caused by calcium crystals in the inner ear that become displaced. This causes a disturbance in an area of the inner ear that helps your brain sense movement and balance. Symptoms include loss of balance and falling, feeling that you or your surroundings are moving, nausea and vomiting, and blurred vision. This condition can be diagnosed based on symptoms, a physical exam, and positional tests. Follow safety instructions as told by your health care provider and keep all follow-up visits. This is important. This information is not intended to replace advice given to you by your health care provider. Make sure you discuss any questions you have with your healthcare provider. Document Revised: 07/10/2020 Document Reviewed: 07/10/2020 Elsevier Patient Education  [place].  Methylprednisolone Tablets What is this medication? METHYLPREDNISOLONE (meth ill pred NISS oh lone) treats many conditions such as asthma, allergic reactions, arthritis, and inflammatory bowel diseases. It works by decreasing inflammation. It belongs to a group of medications calledsteroids. This medicine may be used for other purposes; ask your health care provider orpharmacist if you have questions. COMMON BRAND NAME(S): Medrol, Medrol Dosepak What should I tell my care team before I take this medication? They need to know if you have any of these conditions: Cushing's syndrome Eye disease,  vision problems Diabetes Glaucoma Heart disease High blood pressure Infection (especially a virus infection such as chickenpox, cold sores, or herpes) Liver disease Mental illness Myasthenia gravis Osteoporosis Recently received or scheduled to receive a vaccine Seizures Stomach or intestine problems Thyroid disease An unusual or allergic reaction to lactose, methylprednisolone, other medications, foods, dyes, or preservatives Pregnant or trying to get pregnant Breast-feeding How should I use this medication? Take this medication by mouth with a glass of water. Follow the directions on the prescription label. Take this medication with food. If you are taking this medication once a day, take it in the morning. Do not take it more often than directed. Do not suddenly stop taking your medication because you may develop a severe reaction. Your care team will tell you how much medication to take. If your care team wants you to stop the medication, the dose may be slowly loweredover time to avoid any side effects. Talk to your care team about the use of this medication in children. Specialcare may be needed. Overdosage: If you think you have taken too much of this medicine contact apoison control center or emergency room at once. NOTE: This medicine is only for you. Do not share this medicine with others. What if I miss a dose? If you miss a dose, take it as soon as you can. If it is almost time for your next dose, talk to your care team. You may need to miss a dose or take an  extradose. Do not take double or extra doses without advice. What may interact with this medication? Do not take this medication with any of the following: Alefacept Echinacea Live virus vaccines Metyrapone Mifepristone This medication may also interact with the following: Amphotericin B Aspirin and aspirin-like medications Certain antibiotics like erythromycin, clarithromycin, troleandomycin Certain medications  for diabetes Certain medications for fungal infections like ketoconazole Certain medications for seizures like carbamazepine, phenobarbital, phenytoin Certain medications that treat or prevent blood clots like warfarin Cholestyramine Cyclosporine Digoxin Diuretics Female hormones, like estrogens and birth control pills Isoniazid NSAIDs, medications for pain inflammation, like ibuprofen or naproxen Other medications for myasthenia gravis Rifampin Vaccines This list may not describe all possible interactions. Give your health care provider a list of all the medicines, herbs, non-prescription drugs, or dietary supplements you use. Also tell them if you smoke, drink alcohol, or use illegaldrugs. Some items may interact with your medicine. What should I watch for while using this medication? Tell your care team if your symptoms do not start to get better or if they get worse. Do not stop taking except on your care team's advice. You may develop asevere reaction. Your care team will tell you how much medication to take. This medication may increase your risk of getting an infection. Tell your care team if you are around anyone with measles or chickenpox, or if you developsores or blisters that do not heal properly. This medication may increase blood sugar levels. Ask your care team if changesin diet or medications are needed if you have diabetes. Tell your care team right away if you have any change in your eyesight. Using this medication for a long time may increase your risk of low bone mass.Talk to your care team about bone health. What side effects may I notice from receiving this medication? Side effects that you should report to your care team as soon as possible: Allergic reactions-skin rash, itching, hives, swelling of the face, lips, tongue, or throat Cushing syndrome-increased fat around the midsection, upper back, neck, or face, pink or purple stretch marks on the skin, thinning, fragile  skin that easily bruises, unexpected hair growth High blood sugar (hyperglycemia)-increased thirst or amount of urine, unusual weakness or fatigue, blurry vision Increase in blood pressure Infection-fever, chills, cough, sore throat, wounds that don't heal, pain or trouble when passing urine, general feeling of discomfort or being unwell Low adrenal gland function-nausea, vomiting, loss of appetite, unusual weakness or fatigue, dizziness Mood and behavior changes-anxiety, nervousness, confusion, hallucinations, irritability, hostility, thoughts of suicide or self-harm, worsening mood, feelings of depression Stomach bleeding-bloody or black, tar-like stools, vomiting blood or brown material that looks like coffee grounds Swelling of the ankles, hands, or feet Side effects that usually do not require medical attention (report to your careteam if they continue or are bothersome): Acne General discomfort and fatigue Headache Increase in appetite Nausea Trouble sleeping Weight gain This list may not describe all possible side effects. Call your doctor for medical advice about side effects. You may report side effects to FDA at1-800-FDA-1088. Where should I keep my medication? Keep out of the reach of children and pets. Store at room temperature between 20 and 25 degrees C (68 and 77 degrees F).Throw away any unused medication after the expiration date. NOTE: This sheet is a summary. It may not cover all possible information. If you have questions about this medicine, talk to your doctor, pharmacist, orhealth care provider.  2022 Elsevier/Gold Standard (2020-09-16 16:18:31)

## 2021-03-09 ENCOUNTER — Encounter: Payer: Self-pay | Admitting: Neurology

## 2021-03-10 ENCOUNTER — Telehealth: Payer: Self-pay

## 2021-03-10 NOTE — Telephone Encounter (Signed)
Referral for vestibular rehab sent to [facility]. P: [phone]. Pt will need to call to schedule.

## 2021-03-10 NOTE — Telephone Encounter (Signed)
Referral for ENT sent to [facility]. P: [phone].

## 2021-03-25 ENCOUNTER — Other Ambulatory Visit: Payer: Self-pay

## 2021-03-25 ENCOUNTER — Ambulatory Visit (INDEPENDENT_AMBULATORY_CARE_PROVIDER_SITE_OTHER): Payer: 59 | Admitting: Pharmacist

## 2021-03-25 VITALS — BP 132/84 | HR 66 | Resp 17 | Ht 65.0 in | Wt 157.8 lb

## 2021-03-25 DIAGNOSIS — I10 Essential (primary) hypertension: Secondary | ICD-10-CM

## 2021-03-25 MED ORDER — AMLODIPINE BESYLATE 5 MG PO TABS: 5.0000 mg | ORAL_TABLET | Freq: Every day | ORAL | 0 refills | Status: DC

## 2021-03-25 NOTE — Patient Instructions (Addendum)
It was good seeing you again  We would like your blood pressure to be less than 130/80 so you are making good progress  Continue your losartan/hydrochlorothiazide 50/12.'5mg'$  once daily in the morning  We will increase your amlodipine to 5 mg once a day.  You can take 2 of your 2.'5mg'$  tablets now until you run out  Continue your exercise routine and continue to avoid salt  I will call you in early September and we will go over your readings  Give me a call with any questions!  Karren Cobble, PharmD, BCACP, [place], [place] [id number] [place], [place] [address] Phone: [phone]; Fax: [phone] 03/25/2021 3:46 PM

## 2021-03-25 NOTE — Progress Notes (Signed)
Patient ID: [name]                 DOB: [birth year]                      MRN: [id number]     HPI: [name] is a 65 y.o. female referred by Dr. Gwenlyn Found to HTN clinic. PMH is significant for HTN, HLD, CAD, and sarcoidosis.  At last visit with Dr Gwenlyn Found, BP was elevated at 138/84. Patient was instructed to keep BP log and have recheck in HTN clinic.  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  Has been hesitant to leave her house in the last years due to Covid. Has recently started leaving the house and going to church which makes her nervous due to history of sarcoidosis.  At last visit, patient BP was 144/90 and amlodipine 2.'5mg'$  once a day was started. Patient had upcming referral to neuro to  assess causes of dizziness.  Patient presents today in good spirits.  Neuro had performed an MRI and referred to ENT and possible vestibular rehab therapy. Patient concerned because copays have been high.    Has increased exercise and is now waling over 2 miles a day.  Brought BP log:  8/2: 142/91 8/1: 139/81 7/30: 128/89 7/30: 131/90 7/29: 138/89 7/29: 134/92 7/28: 140/90 7/28: 127/81 7/27: 130/85 7/27: 126/84  Patient reports no adverse effects to amlodipine  Current HTN meds: losartan/HCTZ 50/12.'5mg'$  daily, amlodipine 2.'5mg'$  daily Previously tried: n/a BP goal: <130/80  Wt Readings from Last 3 Encounters:  03/06/21 160 lb (72.6 kg)  03/04/21 160 lb (72.6 kg)  02/25/21 156 lb 12.8 oz (71.1 kg)   BP Readings from Last 3 Encounters:  03/06/21 (!) 137/92  03/04/21 130/76  02/25/21 (!) 144/90   Pulse Readings from Last 3 Encounters:  03/06/21 72  03/04/21 76  02/25/21 74    Renal function: CrCl cannot be calculated (Patient's most recent lab result is older than the maximum 21 days allowed.).  Past Medical History:  Diagnosis Date   Anemia    Angio-edema    Chronic sinusitis    Cough    Diabetes mellitus without complication ([place])     Diverticulosis 2009   mild sigmoid   High cholesterol    Hypertension    Insomnia    Recurrent upper respiratory infection (URI)    Sarcoidosis    Vitamin D deficiency     Current Outpatient Medications on File Prior to Visit  Medication Sig Dispense Refill   albuterol (VENTOLIN HFA) 108 (90 Base) MCG/ACT inhaler USE 1 INHALATION BY MOUTH  EVERY 4 HOURS AS NEEDED FOR WHEEZING OR SHORTNESS OF  BREATH. (Patient taking differently: Inhale 2 puffs into the lungs every 6 (six) hours as needed for wheezing or shortness of breath.) 25.5 g 3   amLODipine (NORVASC) 2.5 MG tablet Take 1 tablet (2.5 mg total) by mouth daily. 30 tablet 2   Ascorbic Acid (VITAMIN C) 1000 MG tablet Take 1,000 mg by mouth daily.     aspirin 81 MG tablet Take 81 mg by mouth daily.     aspirin-acetaminophen-caffeine (EXCEDRIN MIGRAINE) 250-250-65 MG per tablet Take 1 tablet by mouth every 6 (six) hours as needed for pain or migraine.     atorvastatin (LIPITOR) 40 MG tablet Take 1 tablet (40 mg total) by mouth daily. 90 tablet 3   Budeson-Glycopyrrol-Formoterol (BREZTRI AEROSPHERE) 160-9-4.8 MCG/ACT AERO Inhale two puffs twice daily  to prevent cough or wheeze. Rinse mouth after use. 32.1 g 1   famotidine (PEPCID) 40 MG tablet Take 1 tablet (40 mg total) by mouth at bedtime. 30 tablet 5   fenofibrate 160 MG tablet Take 160 mg by mouth daily.     fluticasone (FLONASE) 50 MCG/ACT nasal spray USE 1 SPRAY INTO BOTH  NOSTRILS DAILY FOLLOWING NASAL WASH 32 g 5   ibuprofen (ADVIL,MOTRIN) 200 MG tablet Take 400 mg by mouth every 6 (six) hours as needed for mild pain.     loratadine (CLARITIN) 10 MG tablet Take 1 tablet (10 mg total) by mouth daily. 90 tablet 0   losartan-hydrochlorothiazide (HYZAAR) 50-12.5 MG per tablet Take 1 tablet by mouth daily.     meclizine (ANTIVERT) 25 MG tablet Take 25 mg by mouth 3 (three) times daily as needed for dizziness.     meloxicam (MOBIC) 7.5 MG tablet Take 7.5 mg by mouth daily.      methylPREDNISolone (MEDROL DOSEPAK) 4 MG TBPK tablet Take pills in the morning with food x 6 days 21 tablet 1   metoCLOPramide (REGLAN) 10 MG tablet Take 10 mg by mouth in the morning and at bedtime.     Multiple Vitamin (MULTIVITAMIN) capsule Take 1 capsule by mouth daily.     mupirocin nasal ointment (BACTROBAN NASAL) 2 % Place 1 application into the nose in the morning, at noon, and at bedtime. Use one-half of tube in each nostril twice daily for ten (10) days. After application, press sides of nose together and gently massage. 50 g 0   pantoprazole (PROTONIX) 40 MG tablet TAKE 1 TABLET BY MOUTH  TWICE DAILY 180 tablet 3   Vitamin D, Ergocalciferol, (DRISDOL) 1.25 MG (50000 UNIT) CAPS capsule Take 50,000 Units by mouth every 7 (seven) days.     zinc gluconate 50 MG tablet Take 50 mg by mouth daily.     No current facility-administered medications on file prior to visit.    No Known Allergies   Assessment/Plan:  1. Hypertension -  Patient BP in room upon recheck 132/84 which is above goal of <130/80 but has improved.  Home BP readings have also improved but are yet to reach goal.  Will increase amlodipine to 5 mg once daily.  Will contact patient in 1 month to check home BP readings. Patient also reports she will send over readings to my chart.  If readings still not at goal, consider increasing amlodipine to '10mg'$  daily or consider increasing losartan/HCTZ to 100/25.  Continue losartan/HCTZ 50/12.5 daily Increase amlodipine to 5 mg daily Recheck over phone/myChart in 4 weeks  Karren Cobble, PharmD, BCACP, [place], [place] [id number] [place], [place] [address] Phone: [phone]; Fax: [phone] 03/25/2021 4:06 PM

## 2021-03-26 ENCOUNTER — Encounter: Payer: Self-pay | Admitting: Nurse Practitioner

## 2021-03-26 ENCOUNTER — Ambulatory Visit (INDEPENDENT_AMBULATORY_CARE_PROVIDER_SITE_OTHER): Payer: 59 | Admitting: Nurse Practitioner

## 2021-03-26 ENCOUNTER — Other Ambulatory Visit (INDEPENDENT_AMBULATORY_CARE_PROVIDER_SITE_OTHER): Payer: 59

## 2021-03-26 VITALS — BP 110/70 | HR 65 | Ht 65.0 in | Wt 156.0 lb

## 2021-03-26 DIAGNOSIS — K219 Gastro-esophageal reflux disease without esophagitis: Secondary | ICD-10-CM | POA: Diagnosis not present

## 2021-03-26 DIAGNOSIS — D649 Anemia, unspecified: Secondary | ICD-10-CM

## 2021-03-26 LAB — CBC
HCT: 36.8 % (ref 36.0–46.0)
Hemoglobin: 11.8 g/dL — ABNORMAL LOW (ref 12.0–15.0)
MCHC: 32.2 g/dL (ref 30.0–36.0)
MCV: 85.1 fl (ref 78.0–100.0)
Platelets: 204 10*3/uL (ref 150.0–400.0)
RBC: 4.33 Mil/uL (ref 3.87–5.11)
RDW: 15.1 % (ref 11.5–15.5)
WBC: 9.2 10*3/uL (ref 4.0–10.5)

## 2021-03-26 NOTE — Patient Instructions (Signed)
Your provider has requested that you go to the basement level for lab work before leaving today. Press "B" on the elevator. The lab is located at the first door on the left as you exit the elevator.  Due to recent changes in healthcare laws, you may see the results of your imaging and laboratory studies on MyChart before your provider has had a chance to review them.  We understand that in some cases there may be results that are confusing or concerning to you. Not all laboratory results come back in the same time frame and the provider may be waiting for multiple results in order to interpret others.  Please give [place] 48 hours in order for your provider to thoroughly review all the results before contacting the office for clarification of your results.   Nevin Bloodgood will call you with results of labs. Further Work-up will be determined after lab results.   If you are age 35 or younger, your body mass index should be between 19-25. Your Body mass index is 25.96 kg/m. If this is out of the aformentioned range listed, please consider follow up with your Primary Care Provider.   __________________________________________________________  The [facility] providers would like to encourage you to use [facility] to communicate with providers for non-urgent requests or questions.  Due to long hold times on the telephone, sending your provider a message by [facility] may be a faster and more efficient way to get a response.  Please allow 48 business hours for a response.  Please remember that this is for non-urgent requests.   Thank you for choosing me and [facility].  Tye Savoy NP

## 2021-03-26 NOTE — Progress Notes (Addendum)
ASSESSMENT AND PLAN    # 65 yo female referred for anemia. Reviewed labs sent from PCP. In January 2022 hgb was 11 but on repeat labs in late April hgb was normal (without iron supplements or other intervention). Iron studies normal. No overt bleeding --Repeat CBC today  # Chronic cough with history of sarcoidosis, asthma, allergies, and GERD.  We evaluated her cough in 2020 ( Dr. Carlean Purl ) and did not feel it was reflux related. Subsequently seen by ENT Dr. [name] and laryngoscopy showed no source for chronic cough.  Cough has also been followed by Pulmonologist and by Dr. Neldon Mc at the [facility].  She seems to get only temporary or partial relief from antibiotics for possible bronchitis/sinusitis,  prednisone for sarcoid flare, steroid nasal sprays and inhalers --No acid regurgitation and only occasional heartburn with antireflux measures, BID PPI and famotidine at night.  --We talked about proceeding with EGD with Bravo probe, 24 hr pH and impedence study to try and correlate her cough and reflux episodes. However, I am not sure that it would change management. She is already following anti-reflux measures and is on maximal acid suppression regimen. Additionally, no history of a hiatal hernia which could be repaired in hopes of improving things. Will discuss with Dr. Carlean Purl to see if he feels above studies would be of value to patient.   # Stage IV sarcoid disease with significant pulmonary parenchymal disease followed by Pulmonary ( Dr. Lamonte Sakai)  # Colon cancer screening. She does Cologuards and next one is due is 2023. No change in her [facility] of colon cancer ( no history). No blood in stool.      Complicated situation.  I thought it unlikely that cough is related to reflux.  I still do though it would not be wrong to proceed with a study to sort this out.  It could help her come off medication perhaps.  Regarding her anemia, she is starting to show signs of iron deficiency  though not quite there yet.  I am a bit concerned about that and it is fine to follow this up but I think we should repeat iron studies and CBC in September.  I would do a B12 as well if that has not been done.  Gatha Mayer, MD, [facility]    HISTORY OF PRESENT ILLNESS    Chief Complaint : cough  [name] is a 65 y.o. female known to Dr. Carlean Purl  with a past medical history significant for HTN, DM, asthma, sarcoidosis.  See PMH below for any additional medical problems.    Patient's was last seen in July 2020 for evaluation of cough. PCP, Shanon Rosser , PA has requested evaluation of anemia. Patient is unaware for purpose of today's visit, didn't realize she was anemic,  she thought she was here to follow-up on chronic cough  January 2022 labs: WBC 9, hemoglobin 11, MCV 82.7, platelets 275 Normal renal function,   April 2022 Labs:  B12, folate normal.  Hgb 12.7, mcv 84 Renal function, LFTs normal.  TIBC 372, 19% iron sat Ferritin 49  No black stools or red blood in stool or urine. No abdominal pain, nausea / vomiting.  She doesn't donate blood.   Regarding chronic cough and her visit with Dr. Carlean Purl in 2020, he did not feel cough was reflux related. Imaging studies negative for hiatal hernia , she was on maximal reflux therapy. She subsequently saw ENT, had a laryngoscopy without  significant findings.  The cough hasn't gotten any worse since she last saw [place]. It is worse after any PO intake but also occurs randomly during the day, especially if she talks a lot. No nocturnal coughing but she is following anti-flux measures.  Only occasionally get heartburn. Takes Pantoprazole BID and famotidine at night.   Patient was having some problems with dizziness and PCP referred her to Cardiology, Neurology and Pulmonary and she says evaluations were normal. She will soon be starting PT for possible vestibular labyriithitis.    Past Medical History:  Diagnosis Date   Anemia    Angio-edema     Chronic sinusitis    Cough    Diabetes mellitus without complication ([place])    Diverticulosis 2009   mild sigmoid   High cholesterol    Hypertension    Insomnia    Recurrent upper respiratory infection (URI)    Sarcoidosis    Vitamin D deficiency     Current Medications, Allergies, Past Surgical History, Family History and Social History were reviewed in [employer] record.   Current Outpatient Medications  Medication Sig Dispense Refill   albuterol (VENTOLIN HFA) 108 (90 Base) MCG/ACT inhaler USE 1 INHALATION BY MOUTH  EVERY 4 HOURS AS NEEDED FOR WHEEZING OR SHORTNESS OF  BREATH. (Patient taking differently: Inhale 2 puffs into the lungs every 6 (six) hours as needed for wheezing or shortness of breath.) 25.5 g 3   amLODipine (NORVASC) 5 MG tablet Take 1 tablet (5 mg total) by mouth daily. 90 tablet 0   Ascorbic Acid (VITAMIN C) 1000 MG tablet Take 1,000 mg by mouth daily.     aspirin 81 MG tablet Take 81 mg by mouth daily.     aspirin-acetaminophen-caffeine (EXCEDRIN MIGRAINE) 250-250-65 MG per tablet Take 1 tablet by mouth every 6 (six) hours as needed for pain or migraine.     atorvastatin (LIPITOR) 40 MG tablet Take 1 tablet (40 mg total) by mouth daily. 90 tablet 3   Budeson-Glycopyrrol-Formoterol (BREZTRI AEROSPHERE) 160-9-4.8 MCG/ACT AERO Inhale two puffs twice daily to prevent cough or wheeze. Rinse mouth after use. 32.1 g 1   famotidine (PEPCID) 40 MG tablet Take 1 tablet (40 mg total) by mouth at bedtime. 30 tablet 5   fenofibrate 160 MG tablet Take 160 mg by mouth daily.     fluticasone (FLONASE) 50 MCG/ACT nasal spray USE 1 SPRAY INTO BOTH  NOSTRILS DAILY FOLLOWING NASAL WASH 32 g 5   ibuprofen (ADVIL,MOTRIN) 200 MG tablet Take 400 mg by mouth every 6 (six) hours as needed for mild pain.     loratadine (CLARITIN) 10 MG tablet Take 1 tablet (10 mg total) by mouth daily. 90 tablet 0   losartan-hydrochlorothiazide (HYZAAR) 50-12.5 MG per tablet Take 1  tablet by mouth daily.     meloxicam (MOBIC) 7.5 MG tablet Take 7.5 mg by mouth daily.     metoCLOPramide (REGLAN) 10 MG tablet Take 10 mg by mouth in the morning and at bedtime.     Multiple Vitamin (MULTIVITAMIN) capsule Take 1 capsule by mouth daily.     mupirocin nasal ointment (BACTROBAN NASAL) 2 % Place 1 application into the nose in the morning, at noon, and at bedtime. Use one-half of tube in each nostril twice daily for ten (10) days. After application, press sides of nose together and gently massage. 50 g 0   pantoprazole (PROTONIX) 40 MG tablet TAKE 1 TABLET BY MOUTH  TWICE DAILY 180 tablet 3  Vitamin D, Ergocalciferol, (DRISDOL) 1.25 MG (50000 UNIT) CAPS capsule Take 50,000 Units by mouth every 7 (seven) days.     zinc gluconate 50 MG tablet Take 50 mg by mouth daily.     No current facility-administered medications for this visit.    Review of Systems: No chest pain. No shortness of breath. No urinary complaints.   PHYSICAL EXAM :    Wt Readings from Last 3 Encounters:  03/25/21 157 lb 12.8 oz (71.6 kg)  03/06/21 160 lb (72.6 kg)  03/04/21 160 lb (72.6 kg)    BP 110/70   Pulse 65   Ht '5\' 5"'$  (1.651 m)   Wt 156 lb (70.8 kg)   BMI 25.96 kg/m  Constitutional:  Pleasant female in no acute distress. Psychiatric: Normal mood and affect. Behavior is normal. EENT: Pupils normal.  Conjunctivae are normal. No scleral icterus. Neck supple.  Cardiovascular: Normal rate, regular rhythm. No edema Pulmonary/chest: Effort normal and breath sounds normal. No wheezing, rales or rhonchi. Abdominal: Soft, nondistended, nontender. Bowel sounds active throughout. There are no masses palpable. No hepatomegaly. Neurological: Alert and oriented to person place and time. Skin: Skin is warm and dry. No rashes noted.  Tye Savoy, NP  03/26/2021, 10:02 AM I spent 35 minutes total reviewing records, obtaining history, performing exam, counseling patient and documenting visit / findings.     Cc:  Scott Long, P.A

## 2021-04-09 ENCOUNTER — Telehealth: Payer: Self-pay

## 2021-04-09 DIAGNOSIS — D649 Anemia, unspecified: Secondary | ICD-10-CM

## 2021-04-09 NOTE — Telephone Encounter (Signed)
-----   Message from Gatha Mayer, MD sent at 04/08/2021 12:46 PM EDT ----- I did send a note back from an earlier message.  Your plan below seems a little bit different than what I read but would have been fine for you to do that.   I do not think it is an emergency and I am going to need to talk to her about the options for testing i.e. Bravo versus pH and impedance.  If I am going to scope her and it seems like I need to, the Bravo makes the most sense I think.   I have copied PJ to to help coordinate working her into see me in September.  Lets get some repeat labs in the beginning of September like I said on my first note iron studies CBC and B12 ----- Message ----- From: Willia Craze, NP Sent: 04/03/2021   4:04 PM EDT To: Gatha Mayer, MD, Willia Craze, NP  Will you please review my note.  This lady was referred to me for anemia.  In May and again this month labs of shown mild but anemia without iron deficiency   She has been doing Cologuard's, next one due in 2023.   My thoughts:  1. Colonoscopy for new anemia 2. EGD for new anemia 3. Do you think we should do impedence testing for persistent cough?   What are you thoughts? Thanks

## 2021-04-09 NOTE — Telephone Encounter (Signed)
I spoke with Maurice and told her the plan. Lab orders entered and we will talk tomorrow when she is not driving. She request I call her back after 12 noon to set up an appointment time for September.

## 2021-04-10 NOTE — Telephone Encounter (Signed)
I set up a 05/05/21 appointment at 4pm and she will come get labs the week before.

## 2021-05-05 ENCOUNTER — Ambulatory Visit: Payer: 59 | Admitting: Internal Medicine

## 2021-05-22 ENCOUNTER — Other Ambulatory Visit (INDEPENDENT_AMBULATORY_CARE_PROVIDER_SITE_OTHER): Payer: 59

## 2021-05-22 DIAGNOSIS — D649 Anemia, unspecified: Secondary | ICD-10-CM | POA: Diagnosis not present

## 2021-05-22 LAB — CBC WITH DIFFERENTIAL/PLATELET
Basophils Absolute: 0.1 10*3/uL (ref 0.0–0.1)
Basophils Relative: 0.6 % (ref 0.0–3.0)
Eosinophils Absolute: 0.6 10*3/uL (ref 0.0–0.7)
Eosinophils Relative: 6.9 % — ABNORMAL HIGH (ref 0.0–5.0)
HCT: 36.6 % (ref 36.0–46.0)
Hemoglobin: 12 g/dL (ref 12.0–15.0)
Lymphocytes Relative: 27.1 % (ref 12.0–46.0)
Lymphs Abs: 2.2 10*3/uL (ref 0.7–4.0)
MCHC: 32.7 g/dL (ref 30.0–36.0)
MCV: 83.8 fl (ref 78.0–100.0)
Monocytes Absolute: 0.8 10*3/uL (ref 0.1–1.0)
Monocytes Relative: 9.1 % (ref 3.0–12.0)
Neutro Abs: 4.7 10*3/uL (ref 1.4–7.7)
Neutrophils Relative %: 56.3 % (ref 43.0–77.0)
Platelets: 242 10*3/uL (ref 150.0–400.0)
RBC: 4.37 Mil/uL (ref 3.87–5.11)
RDW: 14.6 % (ref 11.5–15.5)
WBC: 8.3 10*3/uL (ref 4.0–10.5)

## 2021-05-22 LAB — VITAMIN B12: Vitamin B-12: 854 pg/mL (ref 211–911)

## 2021-05-23 LAB — IRON,TIBC AND FERRITIN PANEL
%SAT: 16 % (calc) (ref 16–45)
Ferritin: 55 ng/mL (ref 16–288)
Iron: 60 ug/dL (ref 45–160)
TIBC: 374 mcg/dL (calc) (ref 250–450)

## 2021-05-27 ENCOUNTER — Ambulatory Visit (INDEPENDENT_AMBULATORY_CARE_PROVIDER_SITE_OTHER): Payer: 59 | Admitting: Internal Medicine

## 2021-05-27 ENCOUNTER — Encounter: Payer: Self-pay | Admitting: Internal Medicine

## 2021-05-27 VITALS — BP 138/84 | HR 76 | Ht 64.0 in | Wt 156.5 lb

## 2021-05-27 DIAGNOSIS — D649 Anemia, unspecified: Secondary | ICD-10-CM

## 2021-05-27 DIAGNOSIS — R053 Chronic cough: Secondary | ICD-10-CM | POA: Diagnosis not present

## 2021-05-27 NOTE — Patient Instructions (Signed)
If you are age 63 or older, your body mass index should be between 23-30. Your Body mass index is 26.86 kg/m. If this is out of the aforementioned range listed, please consider follow up with your Primary Care Provider.  If you are age 75 or younger, your body mass index should be between 19-25. Your Body mass index is 26.86 kg/m. If this is out of the aformentioned range listed, please consider follow up with your Primary Care Provider.   __________________________________________________________  The [facility] providers would like to encourage you to use [facility] to communicate with providers for non-urgent requests or questions.  Due to long hold times on the telephone, sending your provider a message by [facility] may be a faster and more efficient way to get a response.  Please allow 48 business hours for a response.  Please remember that this is for non-urgent requests.   Due to recent changes in healthcare laws, you may see the results of your imaging and laboratory studies on MyChart before your provider has had a chance to review them.  We understand that in some cases there may be results that are confusing or concerning to you. Not all laboratory results come back in the same time frame and the provider may be waiting for multiple results in order to interpret others.  Please give [place] 48 hours in order for your provider to thoroughly review all the results before contacting the office for clarification of your results.    Dr. Carlean Purl will talk to Dr's Kozlow and [facility] about your current problems and contact you to discuss any plans.   Thank you choosing [facility].  Silvano Rusk, M.D., [facility]

## 2021-05-27 NOTE — Progress Notes (Signed)
[name] 65 y.o. October 06, 1955 [id number]  Assessment & Plan:   Encounter Diagnoses  Name Primary?   Chronic cough Yes   Anemia, unspecified type    Anemia is okay resolved and B12 and iron studies are normal.  Chronic cough persists.  Whether or not this is related to reflux, I doubt though there could be a contributing component.  We once again reviewed the pros and cons of EGD with Bravo pH monitoring and esophageal manometry and impedance monitoring.  The latter 2 could not be done until next year due to backlog and short staffing at the hospital.  She is not necessarily inclined to pursue either.  My clinical instincts are that they probably would not change management.  However this cough is very frustrating and persist.  I am going to message Dr.'s Kozlow and Byrum to see how important they think this may be.  We did talk about the possibility of withdrawing her acid reflux treatment (twice daily PPI nocturnal H2 blocker and nocturnal Reglan).  She asked about that.  If we did that it would be a slow process.  Would not want to have a significant acid surge.  1 simple option would be to do a barium swallow which could provide some useful information.  She is leery of sedation which is required for endoscopy procedures.  Copy PCP please, Dr. Baltazar Apo Dr. Allena Katz  Subjective:   Chief Complaint: Chronic cough and anemia  HPI [name] is a 65 year old woman with asthma and sarcoidosis and a history of chronic cough.  There was some question whether or not this was GERD related she has been treated by Drs. Byrum and Kozlow and is on maximal acid suppression with twice daily pantoprazole 40 mg nocturnal 40 mg Pepcid and also Reglan at bedtime.  She continues to have a chronic cough.  Steroids have helped this in the past.  She does not have dysphagia or typical heartburn symptoms.  She has never had an EGD and I do not think she is ever had a barium swallow.   CT chest  June 2022 IMPRESSION: 1. New pulmonary nodule in the RIGHT upper lobe 11 x 7 mm. Likely related to the patient's sarcoidosis. Consider three-month follow-up. 2. Similar appearance of mycetoma in the LEFT upper lobe. 3. Adenopathy in the chest with similar appearance. 4. Increasing consolidative changes in the RIGHT middle lobe superimposed on bronchiectasis and honeycombing may reflect worsening infection. This is associated with scattered areas of ground-glass showing increased since previous imaging in the RIGHT middle and lower lobe. 5. Signs of interstitial lung disease with otherwise similar appearance. 6. Aortic atherosclerosis. No Known Allergies Current Meds  Medication Sig   albuterol (VENTOLIN HFA) 108 (90 Base) MCG/ACT inhaler USE 1 INHALATION BY MOUTH  EVERY 4 HOURS AS NEEDED FOR WHEEZING OR SHORTNESS OF  BREATH. (Patient taking differently: Inhale 2 puffs into the lungs every 6 (six) hours as needed for wheezing or shortness of breath.)   amLODipine (NORVASC) 5 MG tablet Take 1 tablet (5 mg total) by mouth daily.   Ascorbic Acid (VITAMIN C) 1000 MG tablet Take 1,000 mg by mouth daily.   aspirin 81 MG tablet Take 81 mg by mouth daily.   aspirin-acetaminophen-caffeine (EXCEDRIN MIGRAINE) 250-250-65 MG per tablet Take 1 tablet by mouth every 6 (six) hours as needed for pain or migraine.   atorvastatin (LIPITOR) 40 MG tablet Take 1 tablet (40 mg total) by mouth daily.   Budeson-Glycopyrrol-Formoterol (BREZTRI AEROSPHERE)  160-9-4.8 MCG/ACT AERO Inhale two puffs twice daily to prevent cough or wheeze. Rinse mouth after use.   famotidine (PEPCID) 40 MG tablet Take 1 tablet (40 mg total) by mouth at bedtime.   fenofibrate 160 MG tablet Take 160 mg by mouth daily.   fluticasone (FLONASE) 50 MCG/ACT nasal spray USE 1 SPRAY INTO BOTH  NOSTRILS DAILY FOLLOWING NASAL WASH   ibuprofen (ADVIL,MOTRIN) 200 MG tablet Take 400 mg by mouth every 6 (six) hours as needed for mild pain.    loratadine (CLARITIN) 10 MG tablet Take 1 tablet (10 mg total) by mouth daily.   losartan-hydrochlorothiazide (HYZAAR) 50-12.5 MG per tablet Take 1 tablet by mouth daily.   meloxicam (MOBIC) 7.5 MG tablet Take 7.5 mg by mouth daily.   metoCLOPramide (REGLAN) 10 MG tablet Take 10 mg by mouth in the morning and at bedtime.   Multiple Vitamin (MULTIVITAMIN) capsule Take 1 capsule by mouth daily.   mupirocin nasal ointment (BACTROBAN NASAL) 2 % Place 1 application into the nose in the morning, at noon, and at bedtime. Use one-half of tube in each nostril twice daily for ten (10) days. After application, press sides of nose together and gently massage.   pantoprazole (PROTONIX) 40 MG tablet TAKE 1 TABLET BY MOUTH  TWICE DAILY   Vitamin D, Ergocalciferol, (DRISDOL) 1.25 MG (50000 UNIT) CAPS capsule Take 50,000 Units by mouth every 7 (seven) days.   zinc gluconate 50 MG tablet Take 50 mg by mouth daily.   Past Medical History:  Diagnosis Date   Anemia    Angio-edema    Chronic sinusitis    Cough    Diabetes mellitus without complication ([place])    Diverticulosis 2009   mild sigmoid   High cholesterol    Hypertension    Insomnia    Recurrent upper respiratory infection (URI)    Sarcoidosis    Vitamin D deficiency    Past Surgical History:  Procedure Laterality Date   COLONOSCOPY     LYMPHADENECTOMY     NOSE SURGERY     SINOSCOPY     TUBAL LIGATION     VESICOVAGINAL FISTULA CLOSURE W/ TAH     VIDEO BRONCHOSCOPY Bilateral 07/29/2017   Procedure: VIDEO BRONCHOSCOPY WITH FLUORO;  Surgeon: Collene Gobble, MD;  Location: [facility] ENDOSCOPY;  Service: Cardiopulmonary;  Laterality: Bilateral;   Social History   Social History Narrative   Patient lives at home alone.  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   Review of Systems As above  Objective:   Physical Exam BP 138/84 (BP Location: Left Arm, Patient Position: Sitting, Cuff Size: Normal)   Pulse 76   Ht 5\' 4"  (1.626 m) Comment: height measured without shoes  Wt 156 lb 8 oz (71 kg)   BMI 26.86 kg/m  Frequent nonproductive coughing during the interview

## 2021-05-28 ENCOUNTER — Telehealth: Payer: Self-pay

## 2021-05-28 DIAGNOSIS — K219 Gastro-esophageal reflux disease without esophagitis: Secondary | ICD-10-CM

## 2021-05-28 DIAGNOSIS — R053 Chronic cough: Secondary | ICD-10-CM

## 2021-05-28 NOTE — Telephone Encounter (Signed)
I spoke with [name] and told her what Dr Carlean Purl advised. She is agreeable to the test. I told her they will reach out to her from [phone]. She knows to call them if she doesn't hear back in a timely manner.

## 2021-05-28 NOTE — Telephone Encounter (Signed)
-----   Message from Gatha Mayer, MD sent at 05/28/2021  1:15 PM EDT ----- Regarding: RE: Questions re: further diagnostic work-up Rob and Randall Hiss,  Thanks for your input.  I will start with a barium swallow.  Sandi Carne, please tell Ms. Joneen Caraway I have discussed w/ Drs. Kozlow and Byrum and that we will do a barium swallow test to evaluate reflux now and then see where that might lead [place].  So order barium swallow with tablet re: chronic cough and GERD   ----- Message ----- From: Collene Gobble, MD Sent: 05/28/2021  11:14 AM EDT To: Jiles Prows, MD, Gatha Mayer, MD Subject: RE: Questions re: further diagnostic work-up   Thanks very much for your assistance with Ms Joneen Caraway. I will certainly defer to your recs regarding usefulness of any further assessment for breakthrough reflux - I think we know clinically that this is part of her chronic cough. I believe that sarcoid and  airways disease are fairly well controlled - at least for now.   Rob  ----- Message ----- From: Jiles Prows, MD Sent: 05/28/2021   8:11 AM EDT To: Gatha Mayer, MD, Collene Gobble, MD Subject: RE: Questions re: further diagnostic work-up   Thanks Glendell Docker. She is a very nice person who has bad lung disease, unsure if it is an active inflammatory process or an inactive inflammatory process at this point, and she appears to have bad reflux. She is better regarding her lifestyle altering chronic cough thankfully on her current plan. But she is never going to eliminate all respiratory tract symptoms with her degree of lung disease and reflux. I doubt she would consider a fundoplication so question whether evaluation for this procedure would be helpful. Seems like she has decades of bad reflux and getting a barium swallow and looking at her mucosa as a screening exam may be helpful.   ----- Message ----- From: Gatha Mayer, MD Sent: 05/27/2021   5:59 PM EDT To: Jiles Prows, MD, Collene Gobble, MD Subject: Questions re:  further diagnostic work-up       [place],  This nice lady has asthma and sarcoidosis and a mycetoma in her lung and chronic cough.  She is on maximal medical GERD therapy and the question keeps coming up as to whether there is a need to do further diagnostic testing.  She is somewhat leery of sedated procedures.  She has never had an EGD though I am not sure that would change anything.  We have been talking about the possibility of pH testing with a Bravo implant into the esophagus to monitor things for 2 days versus esophageal manometry with pH and impedance.  My sense is usually these type of tests when symptoms persist in the face of maximal therapy failed to yield significant information other than that to tell us that its not reflux.  I will say that I did not realize she is never had a barium swallow and that could be helpful.  I am not trying to avoid the other tests but just trying to make sure what ever we do would impact treatment.  I hope this is specific enough to get your input on these issues.  Let me know if you have other questions and I appreciate hearing from you about Ms. Mariea Clonts.  Best regards,  Glendell Docker

## 2021-07-02 ENCOUNTER — Ambulatory Visit (HOSPITAL_COMMUNITY)
Admission: RE | Admit: 2021-07-02 | Discharge: 2021-07-02 | Disposition: A | Discharge status: Home / Self Care | Payer: 59 | Source: Ambulatory Visit | Attending: Internal Medicine | Admitting: Internal Medicine

## 2021-07-02 DIAGNOSIS — K219 Gastro-esophageal reflux disease without esophagitis: Secondary | ICD-10-CM | POA: Insufficient documentation

## 2021-07-02 DIAGNOSIS — R053 Chronic cough: Secondary | ICD-10-CM | POA: Insufficient documentation

## 2021-07-07 ENCOUNTER — Other Ambulatory Visit: Payer: Self-pay | Admitting: Allergy and Immunology

## 2021-07-09 DIAGNOSIS — R42 Dizziness and giddiness: Secondary | ICD-10-CM | POA: Insufficient documentation

## 2021-07-22 ENCOUNTER — Other Ambulatory Visit: Payer: Self-pay | Admitting: Cardiovascular Disease

## 2021-07-22 DIAGNOSIS — I10 Essential (primary) hypertension: Secondary | ICD-10-CM

## 2021-07-28 ENCOUNTER — Other Ambulatory Visit: Payer: Self-pay | Admitting: Allergy and Immunology

## 2021-09-09 ENCOUNTER — Ambulatory Visit (INDEPENDENT_AMBULATORY_CARE_PROVIDER_SITE_OTHER): Payer: 59 | Admitting: Allergy and Immunology

## 2021-09-09 ENCOUNTER — Encounter: Payer: Self-pay | Admitting: Allergy and Immunology

## 2021-09-09 ENCOUNTER — Other Ambulatory Visit: Payer: Self-pay

## 2021-09-09 VITALS — BP 132/82 | HR 65 | Temp 97.6°F | Resp 16 | Ht 65.0 in | Wt 159.2 lb

## 2021-09-09 DIAGNOSIS — J4540 Moderate persistent asthma, uncomplicated: Secondary | ICD-10-CM | POA: Diagnosis not present

## 2021-09-09 DIAGNOSIS — K219 Gastro-esophageal reflux disease without esophagitis: Secondary | ICD-10-CM

## 2021-09-09 DIAGNOSIS — J3089 Other allergic rhinitis: Secondary | ICD-10-CM

## 2021-09-09 DIAGNOSIS — D860 Sarcoidosis of lung: Secondary | ICD-10-CM | POA: Diagnosis not present

## 2021-09-09 DIAGNOSIS — J849 Interstitial pulmonary disease, unspecified: Secondary | ICD-10-CM | POA: Diagnosis not present

## 2021-09-09 MED ORDER — ALBUTEROL SULFATE HFA 108 (90 BASE) MCG/ACT IN AERS: 2.0000 | INHALATION_SPRAY | RESPIRATORY_TRACT | 1 refills | Status: DC | PRN

## 2021-09-09 MED ORDER — BREZTRI AEROSPHERE 160-9-4.8 MCG/ACT IN AERO: 2.0000 | INHALATION_SPRAY | Freq: Two times a day (BID) | RESPIRATORY_TRACT | 1 refills | Status: DC

## 2021-09-09 MED ORDER — LEVOCETIRIZINE DIHYDROCHLORIDE 5 MG PO TABS: 5.0000 mg | ORAL_TABLET | Freq: Two times a day (BID) | ORAL | 5 refills | Status: DC | PRN

## 2021-09-09 MED ORDER — FAMOTIDINE 40 MG PO TABS: 40.0000 mg | ORAL_TABLET | Freq: Every evening | ORAL | 1 refills | Status: DC

## 2021-09-09 MED ORDER — FLUTICASONE PROPIONATE 50 MCG/ACT NA SUSP: 1.0000 | Freq: Every day | NASAL | 1 refills | Status: DC

## 2021-09-09 MED ORDER — PANTOPRAZOLE SODIUM 40 MG PO TBEC: 40.0000 mg | DELAYED_RELEASE_TABLET | Freq: Two times a day (BID) | ORAL | 5 refills | Status: DC

## 2021-09-09 MED ORDER — METOCLOPRAMIDE HCL 10 MG PO TABS: 10.0000 mg | ORAL_TABLET | Freq: Two times a day (BID) | ORAL | 1 refills | Status: DC

## 2021-09-09 NOTE — Patient Instructions (Addendum)
°  1.  Continue to perform allergen avoidance measures - house dust mite  2.  Continue to treat inflammation of airway:   A.  Breztri - 2 inhalations 2 times a day  B.  Flonase 1 spray each nostril only 1 time per day.      3.  Continue to treat reflux:   A.  Eliminate use of fish oil, caffeine, chocolate  B.  Raise head of bed  C.  No late meals  D.  Pantoprazole 40 mg twice a day  E.  Famotidine 40 mg in evening  F.  Metoclopramide 10 mg 2 times per day  5.  If needed:   A.  OTC antihistamine   B.  Albuterol HFA-2 inhalations once a day  6. Return to clinic in 6 months or earlier if problem  7.  Engage in progressive aerobic exercise program

## 2021-09-09 NOTE — Progress Notes (Signed)
[facility]   Follow-up Note  Referring Provider: [name], NP Primary Provider: [name], NP Date of Office Visit: 09/09/2021  Subjective:   [name] (DOB: Jul 29, 1956) is a 66 y.o. female who returns to the [facility] on 09/09/2021 in re-evaluation of the following:  HPI: [name] returns to this clinic in evaluation of cough with contribution from asthma, LPR, and history of sarcoidosis with interstitial lung disease/fibrosis.  Her last visit to this clinic was 04 March 2021.  Overall she is doing okay.  Okay for [place] means that she has a chronic cough but it is under much better control at this point time on her current therapy.  It does not disturb her sleep.  She does not use a short acting bronchodilator.  Unfortunately, she is no longer exercising at this point in time because of the cold weather.  Her nose is doing well.  She has not been having any bleeding from her nose.  She intermittently uses a nasal steroid.  She has had very little issues with reflux.  She has had 4 COVID vaccines and the flu vaccine.  Allergies as of 09/09/2021   No Known Allergies      Medication List    albuterol 108 (90 Base) MCG/ACT inhaler Commonly known as: VENTOLIN HFA USE 1 INHALATION BY MOUTH  EVERY 4 HOURS AS NEEDED FOR WHEEZING   amLODipine 5 MG tablet Commonly known as: NORVASC Take 1 tablet by mouth once daily   aspirin 81 MG tablet Take 81 mg by mouth daily.   aspirin-acetaminophen-caffeine 250-250-65 MG tablet Commonly known as: EXCEDRIN MIGRAINE Take 1 tablet by mouth every 6 (six) hours as needed for pain or migraine.   atorvastatin 40 MG tablet Commonly known as: LIPITOR Take 1 tablet (40 mg total) by mouth daily.   Breztri Aerosphere 160-9-4.8 MCG/ACT Aero Generic drug: Budeson-Glycopyrrol-Formoterol INHALE 2 INHALATIONS BY  MOUTH TWICE DAILY TO  PREVENT COUGH OR WHEEZE.   RINSE MOUTH AFTER USE   famotidine 40 MG tablet Commonly known as: PEPCID TAKE 1 TABLET BY MOUTH AT  BEDTIME   fenofibrate 160 MG tablet Take 160 mg by mouth daily.   fluticasone 50 MCG/ACT nasal spray Commonly known as: FLONASE USE 1 SPRAY INTO BOTH  NOSTRILS DAILY FOLLOWING NASAL WASH   ibuprofen 200 MG tablet Commonly known as: ADVIL Take 400 mg by mouth every 6 (six) hours as needed for mild pain.   loratadine 10 MG tablet Commonly known as: CLARITIN Take 1 tablet (10 mg total) by mouth daily.   losartan-hydrochlorothiazide 50-12.5 MG tablet Commonly known as: HYZAAR Take 1 tablet by mouth daily.   meloxicam 7.5 MG tablet Commonly known as: MOBIC Take 7.5 mg by mouth daily.   metoCLOPramide 10 MG tablet Commonly known as: REGLAN Take 10 mg by mouth in the morning and at bedtime.   multivitamin capsule Take 1 capsule by mouth daily.   pantoprazole 40 MG tablet Commonly known as: PROTONIX TAKE 1 TABLET BY MOUTH  TWICE DAILY   vitamin C 1000 MG tablet Take 1,000 mg by mouth daily.   Vitamin D (Ergocalciferol) 1.25 MG (50000 UNIT) Caps capsule Commonly known as: DRISDOL Take 50,000 Units by mouth every 7 (seven) days.   zinc gluconate 50 MG tablet Take 50 mg by mouth daily.    Past Medical History:  Diagnosis Date   Anemia    Angio-edema    Chronic sinusitis  Cough    Diabetes mellitus without complication ([place])    Diverticulosis 2009   mild sigmoid   High cholesterol    Hypertension    Insomnia    Recurrent upper respiratory infection (URI)    Sarcoidosis    Vitamin D deficiency     Past Surgical History:  Procedure Laterality Date   COLONOSCOPY     LYMPHADENECTOMY     NOSE SURGERY     SINOSCOPY     TUBAL LIGATION     VESICOVAGINAL FISTULA CLOSURE W/ TAH     VIDEO BRONCHOSCOPY Bilateral 07/29/2017   Procedure: VIDEO BRONCHOSCOPY WITH FLUORO;  Surgeon: Collene Gobble, MD;  Location: [facility];  Service: Cardiopulmonary;  Laterality:  Bilateral;    Review of systems negative except as noted in HPI / PMHx or noted below:  Review of Systems  Constitutional: Negative.   HENT: Negative.    Eyes: Negative.   Respiratory: Negative.    Cardiovascular: Negative.   Gastrointestinal: Negative.   Genitourinary: Negative.   Musculoskeletal: Negative.   Skin: Negative.   Neurological: Negative.   Endo/Heme/Allergies: Negative.   Psychiatric/Behavioral: Negative.      Objective:   Vitals:   09/09/21 1638  BP: 132/82  Pulse: 65  Resp: 16  Temp: 97.6 F (36.4 C)  SpO2: 99%   Height: 5\' 5"  (165.1 cm)  Weight: 159 lb 3.2 oz (72.2 kg)   Physical Exam Constitutional:      Appearance: She is not diaphoretic.  HENT:     Head: Normocephalic.     Right Ear: Tympanic membrane, ear canal and external ear normal.     Left Ear: Tympanic membrane, ear canal and external ear normal.     Nose: Nose normal. No mucosal edema or rhinorrhea.     Mouth/Throat:     Pharynx: Uvula midline. No oropharyngeal exudate.  Eyes:     Conjunctiva/sclera: Conjunctivae normal.  Neck:     Thyroid: No thyromegaly.     Trachea: Trachea normal. No tracheal tenderness or tracheal deviation.  Cardiovascular:     Rate and Rhythm: Normal rate and regular rhythm.     Heart sounds: Normal heart sounds, S1 normal and S2 normal. No murmur heard. Pulmonary:     Effort: No respiratory distress.     Breath sounds: Normal breath sounds. No stridor. No wheezing or rales.  Lymphadenopathy:     Head:     Right side of head: No tonsillar adenopathy.     Left side of head: No tonsillar adenopathy.     Cervical: No cervical adenopathy.  Skin:    Findings: No erythema or rash.     Nails: There is no clubbing.  Neurological:     Mental Status: She is alert.    Diagnostics:    Spirometry was performed and demonstrated an FEV1 of 1.43 at 67 % of predicted.  Assessment and Plan:   1. Asthma, moderate persistent, well-controlled   2. Sarcoidosis of  lung ([place])   3. Interstitial lung disease ([place])   4. Perennial allergic rhinitis   5. LPRD (laryngopharyngeal reflux disease)     1.  Continue to perform allergen avoidance measures - house dust mite  2.  Continue to treat inflammation of airway:   A.  Breztri - 2 inhalations 2 times a day  B.  Flonase 1 spray each nostril only 1 time per day.      3.  Continue to treat reflux:   A.  Eliminate use of  fish oil, caffeine, chocolate  B.  Raise head of bed  C.  No late meals  D.  Pantoprazole 40 mg twice a day  E.  Famotidine 40 mg in evening  F.  Metoclopramide 10 mg 2 times per day  5.  If needed:   A.  OTC antihistamine   B.  Albuterol HFA-2 inhalations once a day  6. Return to clinic in 6 months or earlier if problem  7.  Engage in progressive aerobic exercise program  Hera appears to be doing pretty well on her combination of therapy which includes anti-inflammatory medications for airway and therapy directed against reflux.  I would like to see her back in this clinic in 6 months while she continues on this plan.  I also had a talk with her today about engaging in a progressive aerobic exercise program as this is 1 nonpharmacological manipulation she can make to enhance her overall health status.  Allena Katz, MD Allergy / Immunology [place]

## 2021-09-10 ENCOUNTER — Encounter: Payer: Self-pay | Admitting: Allergy and Immunology

## 2021-09-21 ENCOUNTER — Other Ambulatory Visit: Payer: Self-pay | Admitting: Allergy and Immunology

## 2021-10-13 ENCOUNTER — Other Ambulatory Visit: Payer: Self-pay | Admitting: Allergy and Immunology

## 2021-12-05 ENCOUNTER — Other Ambulatory Visit: Payer: Self-pay

## 2021-12-05 DIAGNOSIS — I10 Essential (primary) hypertension: Secondary | ICD-10-CM

## 2021-12-05 MED ORDER — AMLODIPINE BESYLATE 5 MG PO TABS: 5.0000 mg | ORAL_TABLET | Freq: Every day | ORAL | 2 refills | Status: DC

## 2021-12-25 ENCOUNTER — Other Ambulatory Visit: Payer: Self-pay | Admitting: Allergy and Immunology

## 2022-01-05 ENCOUNTER — Other Ambulatory Visit: Payer: Self-pay | Admitting: Allergy and Immunology

## 2022-03-04 ENCOUNTER — Other Ambulatory Visit: Payer: Self-pay | Admitting: Allergy and Immunology

## 2022-03-17 ENCOUNTER — Other Ambulatory Visit: Payer: Self-pay | Admitting: Allergy and Immunology

## 2022-03-17 ENCOUNTER — Ambulatory Visit (INDEPENDENT_AMBULATORY_CARE_PROVIDER_SITE_OTHER): Payer: 59 | Admitting: Allergy and Immunology

## 2022-03-17 ENCOUNTER — Encounter: Payer: Self-pay | Admitting: Allergy and Immunology

## 2022-03-17 VITALS — BP 130/76 | HR 76 | Temp 98.3°F | Resp 16 | Ht 65.5 in | Wt 171.8 lb

## 2022-03-17 DIAGNOSIS — J4550 Severe persistent asthma, uncomplicated: Secondary | ICD-10-CM

## 2022-03-17 DIAGNOSIS — K219 Gastro-esophageal reflux disease without esophagitis: Secondary | ICD-10-CM | POA: Diagnosis not present

## 2022-03-17 DIAGNOSIS — J3089 Other allergic rhinitis: Secondary | ICD-10-CM | POA: Diagnosis not present

## 2022-03-17 DIAGNOSIS — J3489 Other specified disorders of nose and nasal sinuses: Secondary | ICD-10-CM

## 2022-03-17 DIAGNOSIS — D860 Sarcoidosis of lung: Secondary | ICD-10-CM

## 2022-03-17 MED ORDER — FLUTICASONE PROPIONATE 50 MCG/ACT NA SUSP: 1.0000 | Freq: Every day | NASAL | 1 refills | Status: DC

## 2022-03-17 MED ORDER — LEVOCETIRIZINE DIHYDROCHLORIDE 5 MG PO TABS: 5.0000 mg | ORAL_TABLET | Freq: Every day | ORAL | 1 refills | Status: DC | PRN

## 2022-03-17 MED ORDER — PANTOPRAZOLE SODIUM 40 MG PO TBEC: 40.0000 mg | DELAYED_RELEASE_TABLET | Freq: Two times a day (BID) | ORAL | 1 refills | Status: DC

## 2022-03-17 MED ORDER — ALBUTEROL SULFATE (2.5 MG/3ML) 0.083% IN NEBU: 2.5000 mg | INHALATION_SOLUTION | RESPIRATORY_TRACT | 2 refills | Status: DC | PRN

## 2022-03-17 MED ORDER — BREZTRI AEROSPHERE 160-9-4.8 MCG/ACT IN AERO: 2.0000 | INHALATION_SPRAY | Freq: Two times a day (BID) | RESPIRATORY_TRACT | 1 refills | Status: DC

## 2022-03-17 MED ORDER — FAMOTIDINE 40 MG PO TABS: 40.0000 mg | ORAL_TABLET | Freq: Every evening | ORAL | 1 refills | Status: DC

## 2022-03-17 MED ORDER — ALBUTEROL SULFATE HFA 108 (90 BASE) MCG/ACT IN AERS: 2.0000 | INHALATION_SPRAY | RESPIRATORY_TRACT | 1 refills | Status: DC | PRN

## 2022-03-17 NOTE — Progress Notes (Unsigned)
[facility]   Follow-up Note  Referring Provider: [name], NP Primary Provider: [name], NP Date of Office Visit: 03/17/2022  Subjective:   [name] (DOB: [birth year]) is a 66 y.o. female who returns to the [facility] on 03/17/2022 in re-evaluation of the following:  HPI: Mayo returns to this clinic in evaluation of severe asthma, rhinitis, LPR, in the context of sarcoidosis.  Her last visit in this clinic was 09 September 2021.  She states that she required the administration of systemic steroid and an antibiotic on 2 different occasions during the spring for issues tied up with her lower respiratory tract with unrelenting coughing and wheezing.  Her last administration was in June 2023.  She has been doing okay since that point in time.  This occurs even though she remains on a triple inhaler and consistently uses Flonase for her upper airway.  She believes that her reflux is under very good control on her current plan of pantoprazole and famotidine and metoclopramide.  Allergies as of 03/17/2022   No Known Allergies      Medication List    albuterol 108 (90 Base) MCG/ACT inhaler Commonly known as: VENTOLIN HFA Inhale 2 puffs into the lungs every 4 (four) hours as needed for wheezing or shortness of breath.   albuterol (2.5 MG/3ML) 0.083% nebulizer solution Commonly known as: PROVENTIL SMARTSIG:3 Milliliter(s) Via Nebulizer Every 3-4 Hours PRN   amLODipine 5 MG tablet Commonly known as: NORVASC Take 1 tablet (5 mg total) by mouth daily.   aspirin 81 MG tablet Take 81 mg by mouth daily.   aspirin-acetaminophen-caffeine 250-250-65 MG tablet Commonly known as: EXCEDRIN MIGRAINE Take 1 tablet by mouth every 6 (six) hours as needed for pain or migraine.   atorvastatin 40 MG tablet Commonly known as: LIPITOR Take 1 tablet (40 mg total) by mouth daily.   Breztri Aerosphere  160-9-4.8 MCG/ACT Aero Generic drug: Budeson-Glycopyrrol-Formoterol USE 2 INHALATIONS BY MOUTH TWICE DAILY TO PREVENT COUGH OR  WHEEZE. RINSE MOUTH AFTER USE   famotidine 40 MG tablet Commonly known as: PEPCID TAKE 1 TABLET BY MOUTH AT  BEDTIME   fenofibrate 160 MG tablet Take 160 mg by mouth daily.   fluticasone 50 MCG/ACT nasal spray Commonly known as: FLONASE Place 1 spray into both nostrils daily. USE 1 SPRAY INTO BOTH  NOSTRILS DAILY FOLLOWING NASAL WASH   ibuprofen 200 MG tablet Commonly known as: ADVIL Take 400 mg by mouth every 6 (six) hours as needed for mild pain.   levocetirizine 5 MG tablet Commonly known as: XYZAL Take 1 tablet (5 mg total) by mouth 2 (two) times daily as needed for allergies (Can take an extra dose during flare ups.).   loratadine 10 MG tablet Commonly known as: CLARITIN Take 1 tablet (10 mg total) by mouth daily.   losartan-hydrochlorothiazide 50-12.5 MG tablet Commonly known as: HYZAAR Take 1 tablet by mouth daily.   meloxicam 7.5 MG tablet Commonly known as: MOBIC Take 7.5 mg by mouth daily.   metoCLOPramide 10 MG tablet Commonly known as: REGLAN Take 1 tablet (10 mg total) by mouth in the morning and at bedtime.   multivitamin capsule Take 1 capsule by mouth daily.   pantoprazole 40 MG tablet Commonly known as: PROTONIX TAKE 1 TABLET BY MOUTH  TWICE DAILY   vitamin C 1000 MG tablet Take 1,000 mg by mouth daily.   Vitamin D (Ergocalciferol) 1.25 MG (50000 UNIT) Caps  capsule Commonly known as: DRISDOL Take 50,000 Units by mouth every 7 (seven) days.    Past Medical History:  Diagnosis Date   Anemia    Angio-edema    Chronic sinusitis    Cough    Diabetes mellitus without complication ([place])    Diverticulosis 2009   mild sigmoid   High cholesterol    Hypertension    Insomnia    Recurrent upper respiratory infection (URI)    Sarcoidosis    Vitamin D deficiency     Past Surgical History:  Procedure Laterality Date    COLONOSCOPY     LYMPHADENECTOMY     NOSE SURGERY     SINOSCOPY     TUBAL LIGATION     VESICOVAGINAL FISTULA CLOSURE W/ TAH     VIDEO BRONCHOSCOPY Bilateral 07/29/2017   Procedure: VIDEO BRONCHOSCOPY WITH FLUORO;  Surgeon: Collene Gobble, MD;  Location: [facility];  Service: Cardiopulmonary;  Laterality: Bilateral;    Review of systems negative except as noted in HPI / PMHx or noted below:  Review of Systems  Constitutional: Negative.   HENT: Negative.    Eyes: Negative.   Respiratory: Negative.    Cardiovascular: Negative.   Gastrointestinal: Negative.   Genitourinary: Negative.   Musculoskeletal: Negative.   Skin: Negative.   Neurological: Negative.   Endo/Heme/Allergies: Negative.   Psychiatric/Behavioral: Negative.       Objective:   Vitals:   03/17/22 1633  BP: 130/76  Pulse: 76  Resp: 16  Temp: 98.3 F (36.8 C)  SpO2: 97%   Height: 5' 5.5" (166.4 cm)  Weight: 171 lb 12.8 oz (77.9 kg)   Physical Exam Constitutional:      Appearance: She is not diaphoretic.     Comments: Coughing  HENT:     Head: Normocephalic.     Right Ear: Tympanic membrane, ear canal and external ear normal.     Left Ear: Tympanic membrane, ear canal and external ear normal.     Nose: Nose normal. No mucosal edema (Septal perforation) or rhinorrhea.     Mouth/Throat:     Pharynx: Uvula midline. No oropharyngeal exudate.  Eyes:     Conjunctiva/sclera: Conjunctivae normal.  Neck:     Thyroid: No thyromegaly.     Trachea: Trachea normal. No tracheal tenderness or tracheal deviation.  Cardiovascular:     Rate and Rhythm: Normal rate and regular rhythm.     Heart sounds: Normal heart sounds, S1 normal and S2 normal. No murmur heard. Pulmonary:     Effort: No respiratory distress.     Breath sounds: Normal breath sounds. No stridor. No wheezing or rales.  Lymphadenopathy:     Head:     Right side of head: No tonsillar adenopathy.     Left side of head: No tonsillar adenopathy.      Cervical: No cervical adenopathy.  Skin:    Findings: No erythema or rash.     Nails: There is no clubbing.  Neurological:     Mental Status: She is alert.     Diagnostics:    Spirometry was performed and demonstrated an FEV1 of 1.20 at 57 % of predicted.  Assessment and Plan:   1. Not well controlled severe persistent asthma   2. Perennial allergic rhinitis   3. LPRD (laryngopharyngeal reflux disease)   4. Sarcoidosis of lung ([place])   5. Nasal septal perforation    1.  Continue to perform allergen avoidance measures - house dust mite  2.  Continue  to treat inflammation of airway:   A.  Breztri - 2 inhalations 2 times a day  B.  Flonase 1 spray each nostril only 1 time per day.      3.  Continue to treat reflux:   A.  Eliminate use of fish oil, caffeine, chocolate  B.  Raise head of bed  C.  No late meals  D.  Pantoprazole 40 mg twice a day  E.  Famotidine 40 mg in evening  F.  Metoclopramide 10 mg 2 times per day  5.  If needed:   A.  OTC antihistamine   B.  Albuterol HFA-2 inhalations once a day  6. Return to clinic in 6 months or earlier if problem  7. Plan for fall flu vaccine and RSV vaccine  8. Submit for tezepelumab administration ([place])  9. Check blood - CBC w/d, IgE  Alyana has required a systemic steroid on 2 occasions this spring for a respiratory tract issue and we will now start her on a biologic agent.  We will submit for tezepelumab administration pending the results of her blood tests.  She will remain on a large collection of anti-inflammatory agents for her airway and continue to aggressively treat her reflux.  I will contact her with the results of her blood test once they are available for review.  Allena Katz, MD Allergy / Immunology [facility]

## 2022-03-17 NOTE — Patient Instructions (Addendum)
  1.  Continue to perform allergen avoidance measures - house dust mite  2.  Continue to treat inflammation of airway:   A.  Breztri - 2 inhalations 2 times a day  B.  Flonase 1 spray each nostril only 1 time per day.      3.  Continue to treat reflux:   A.  Eliminate use of fish oil, caffeine, chocolate  B.  Raise head of bed  C.  No late meals  D.  Pantoprazole 40 mg twice a day  E.  Famotidine 40 mg in evening  F.  Metoclopramide 10 mg 2 times per day  5.  If needed:   A.  OTC antihistamine   B.  Albuterol HFA-2 inhalations once a day  6. Return to clinic in 6 months or earlier if problem  7. Plan for fall flu vaccine and RSV vaccine  8. Submit for tezepelumab administration ([place])  9. Check blood - CBC w/d, IgE

## 2022-03-18 ENCOUNTER — Encounter: Payer: Self-pay | Admitting: Allergy and Immunology

## 2022-03-20 LAB — CBC WITH DIFFERENTIAL
Basophils Absolute: 0.1 10*3/uL (ref 0.0–0.2)
Basos: 1 %
EOS (ABSOLUTE): 0.7 10*3/uL — ABNORMAL HIGH (ref 0.0–0.4)
Eos: 11 %
Hematocrit: 36.5 % (ref 34.0–46.6)
Hemoglobin: 12.2 g/dL (ref 11.1–15.9)
Immature Grans (Abs): 0 10*3/uL (ref 0.0–0.1)
Immature Granulocytes: 0 %
Lymphocytes Absolute: 1.8 10*3/uL (ref 0.7–3.1)
Lymphs: 30 %
MCH: 27.6 pg (ref 26.6–33.0)
MCHC: 33.4 g/dL (ref 31.5–35.7)
MCV: 83 fL (ref 79–97)
Monocytes Absolute: 0.8 10*3/uL (ref 0.1–0.9)
Monocytes: 13 %
Neutrophils Absolute: 2.7 10*3/uL (ref 1.4–7.0)
Neutrophils: 45 %
RBC: 4.42 x10E6/uL (ref 3.77–5.28)
RDW: 13.4 % (ref 11.7–15.4)
WBC: 6 10*3/uL (ref 3.4–10.8)

## 2022-03-20 LAB — IGE: IgE (Immunoglobulin E), Serum: 118 IU/mL (ref 6–495)

## 2022-06-11 ENCOUNTER — Other Ambulatory Visit: Payer: Self-pay | Admitting: Allergy and Immunology

## 2022-06-18 ENCOUNTER — Encounter: Payer: Self-pay | Admitting: Emergency Medicine

## 2022-06-18 ENCOUNTER — Ambulatory Visit (INDEPENDENT_AMBULATORY_CARE_PROVIDER_SITE_OTHER): Payer: 59 | Admitting: Emergency Medicine

## 2022-06-18 DIAGNOSIS — D869 Sarcoidosis, unspecified: Secondary | ICD-10-CM | POA: Diagnosis not present

## 2022-06-18 DIAGNOSIS — K219 Gastro-esophageal reflux disease without esophagitis: Secondary | ICD-10-CM | POA: Diagnosis not present

## 2022-06-18 DIAGNOSIS — J310 Chronic rhinitis: Secondary | ICD-10-CM

## 2022-06-18 DIAGNOSIS — B479 Mycetoma, unspecified: Secondary | ICD-10-CM

## 2022-06-18 MED ORDER — DOXYCYCLINE HYCLATE 100 MG PO TABS: 100.0000 mg | ORAL_TABLET | Freq: Two times a day (BID) | ORAL | 0 refills | Status: DC

## 2022-06-18 MED ORDER — PREDNISONE 10 MG PO TABS: ORAL_TABLET | ORAL | 0 refills | Status: DC

## 2022-06-18 NOTE — Assessment & Plan Note (Signed)
Flaring symptoms, cough and dyspnea.  Certainly concern for possible progression/activity of her sarcoidosis.  I can treat her for the acute flare with a short course of prednisone but need to determine whether she will need longer-term immunosuppression, a longer course of prednisone or possibly a steroid sparing agent.  Plan to repeat her high-resolution CT scan of the chest to look for any evidence of progression, groundglass/active disease.  She has wanted to avoid maintenance immunosuppression on her most recent visits/discussions.  I suspect that she will ultimately need it since she has been flaring frequently, has severe parenchymal distortion.

## 2022-06-18 NOTE — Patient Instructions (Addendum)
Please take doxycycline as directed until completely gone Please take prednisone as directed until completely gone We will repeat a high-resolution CT scan of the chest to compare with priors. Continue your pantoprazole as you have been taking it Continue your nasal saline rinses, Flonase nasal spray, loratadine as you have been using them Follow-up with Dr. Lamonte Sakai next available after your CT chest so we can discuss the results and plan next steps.

## 2022-06-18 NOTE — Assessment & Plan Note (Signed)
Pantoprazole twice daily.

## 2022-06-18 NOTE — Addendum Note (Signed)
Addended by: Marshia Ly R on: 06/18/2022 11:20 AM   Modules accepted: Orders

## 2022-06-18 NOTE — Progress Notes (Signed)
HPI:    [name] 06/18/22 --66 year old woman whom I have followed for stage IV sarcoidosis with associated cavitary and scar changes on CT scan of the chest.  She also has nasal sinus disease, associated obstructive lung disease.  We have followed her cavitary lung disease and an associated left upper lobe aspergilloma with serial CT scans of the chest.  She deals with chronic cough in the setting of allergic rhinitis, GERD.  She is not on any maintenance therapy for her sarcoid.  Her most recent CT chest was 01/30/2021 as below, question some mild active inflammatory change in the right middle and right lower lobes. She has had 2 flares in the last year, this is the 3rd. Treated w pred and abx.  She returns today reporting that she has been having flaring, worsening cough beginning 2 months ago. Associated w increased, minimally productive, assoc w chest tightness. Increase SOB and decreased energy.  She had a rash about 3 months ago on her back that responded to hydrocortisone cream.  No visual changes reported  Managed on Breztri, uses albuterol about 2x a day lately, rarely if she is not flaring.  She does nasal saline washes, Flonase, loraatdine, Protonix twice daily  CT chest 01/30/2021 reviewed by me, shows stable left upper lobe cavity with 2.5 x 2.7 cm mycetoma, associated bronchiectasis in multiple lobes, honeycomb change all fairly stable.  She had a new 11 mm right upper lobe pulmonary nodule and some scattered right middle lobe and right lower lobe groundglass change.   EXAM :  Vitals:   06/18/22 1024  BP: 118/80  Pulse: 77  Temp: 98.3 F (36.8 C)  TempSrc: Oral  SpO2: 98%  Weight: 176 lb 9.6 oz (80.1 kg)  Height: '5\' 5"'$  (1.651 m)    Gen: Pleasant, well-nourished, in no distress,  normal affect.  Cough frequently  ENT: No lesions,  mouth clear,  oropharynx clear, no postnasal drip, strong voice  Neck: No JVD, no stridor  Lungs: No use of accessory muscles, bilateral inspiratory  crackles at both bases  Cardiovascular: RRR, heart sounds normal, no murmur or gallops, no peripheral edema  Musculoskeletal: No deformities, no cyanosis or clubbing  Neuro: alert, non focal  Skin: Warm, no lesions or rash   Sarcoidosis Flaring symptoms, cough and dyspnea.  Certainly concern for possible progression/activity of her sarcoidosis.  I can treat her for the acute flare with a short course of prednisone but need to determine whether she will need longer-term immunosuppression, a longer course of prednisone or possibly a steroid sparing agent.  Plan to repeat her high-resolution CT scan of the chest to look for any evidence of progression, groundglass/active disease.  She has wanted to avoid maintenance immunosuppression on her most recent visits/discussions.  I suspect that she will ultimately need it since she has been flaring frequently, has severe parenchymal distortion.  Mycetoma Her mycetoma has been stable on serial imaging, last was 01/2021.  Planning to reevaluate on repeat CT chest  Rhinitis, chronic Continue her current allergy regimen  Gastroesophageal reflux disease without esophagitis Pantoprazole twice daily.  Baltazar Apo, MD, PhD 06/18/2022, 10:47 AM [facility] [phone] or if no answer [phone]

## 2022-06-18 NOTE — Assessment & Plan Note (Signed)
Her mycetoma has been stable on serial imaging, last was 01/2021.  Planning to reevaluate on repeat CT chest

## 2022-06-18 NOTE — Assessment & Plan Note (Signed)
Continue her current allergy regimen 

## 2022-07-27 ENCOUNTER — Other Ambulatory Visit: Payer: Self-pay | Admitting: Cardiovascular Disease

## 2022-07-27 DIAGNOSIS — I10 Essential (primary) hypertension: Secondary | ICD-10-CM

## 2022-07-28 ENCOUNTER — Ambulatory Visit
Admission: RE | Admit: 2022-07-28 | Discharge: 2022-07-28 | Disposition: A | Discharge status: Home / Self Care | Payer: 59 | Source: Ambulatory Visit | Attending: Emergency Medicine | Admitting: Emergency Medicine

## 2022-07-28 DIAGNOSIS — D869 Sarcoidosis, unspecified: Secondary | ICD-10-CM

## 2022-08-03 ENCOUNTER — Encounter: Payer: Self-pay | Admitting: Emergency Medicine

## 2022-08-03 ENCOUNTER — Encounter: Payer: Self-pay | Admitting: Internal Medicine

## 2022-08-03 NOTE — Telephone Encounter (Signed)
Called and spoke to pt to schedule OV after CT as previously mentioned at pt's last OV with RB. Pt has been scheduled to see RB on 012/14 at 1430. Pt aware of appt time, date and location.

## 2022-08-05 ENCOUNTER — Ambulatory Visit (AMBULATORY_SURGERY_CENTER): Payer: Self-pay | Admitting: *Deleted

## 2022-08-05 VITALS — Ht 65.0 in | Wt 170.0 lb

## 2022-08-05 DIAGNOSIS — Z1211 Encounter for screening for malignant neoplasm of colon: Secondary | ICD-10-CM

## 2022-08-05 NOTE — Progress Notes (Signed)
No egg or soy allergy known to patient  No issues known to pt with past sedation with any surgeries or procedures Patient denies ever being told they had issues or difficulty with intubation  No FH of Malignant Hyperthermia Pt is not on diet pills Pt is not on  home 02  Pt is not on blood thinners  Pt denies issues with constipation  No A fib or A flutter Have any cardiac testing pending--no Pt instructed to use [email or web address] or GoodRx for a price reduction on prep   

## 2022-08-06 ENCOUNTER — Encounter: Payer: Self-pay | Admitting: Emergency Medicine

## 2022-08-06 ENCOUNTER — Ambulatory Visit (INDEPENDENT_AMBULATORY_CARE_PROVIDER_SITE_OTHER): Payer: 59 | Admitting: Emergency Medicine

## 2022-08-06 VITALS — BP 124/72 | HR 87 | Temp 98.4°F | Ht 65.0 in | Wt 177.0 lb

## 2022-08-06 DIAGNOSIS — D869 Sarcoidosis, unspecified: Secondary | ICD-10-CM | POA: Diagnosis not present

## 2022-08-06 DIAGNOSIS — R053 Chronic cough: Secondary | ICD-10-CM

## 2022-08-06 DIAGNOSIS — J310 Chronic rhinitis: Secondary | ICD-10-CM

## 2022-08-06 DIAGNOSIS — K219 Gastro-esophageal reflux disease without esophagitis: Secondary | ICD-10-CM

## 2022-08-06 MED ORDER — HYDROCODONE BIT-HOMATROP MBR 5-1.5 MG/5ML PO SOLN: 5.0000 mL | Freq: Four times a day (QID) | ORAL | 0 refills | Status: DC | PRN

## 2022-08-06 NOTE — Assessment & Plan Note (Signed)
Continue same regimen.  She has a mildly elevated eosinophil count.  In absence of nasal sinus symptoms unclear to me that she is a good candidate for Biologics.

## 2022-08-06 NOTE — Assessment & Plan Note (Addendum)
As documented previously I suspect that she needs maintenance antiinflammatory regimen.  She is been on methotrexate in the past, prednisone in the past.  She wants to avoid both.  Will continue to follow  We reviewed your CT scan of the chest today.  There are areas improvement and other areas consistent with mild inflammation.  There is also scarring from sarcoidosis that has changed over the years but has been overall stable on recent scans.  We talked about possibly starting a maintenance anti-inflammatory medication to prevent progressive changes from sarcoidosis. Follow with APP in 3 months Follow with Dr. Lamonte Sakai in 6 months or sooner if you have any problems.

## 2022-08-06 NOTE — Assessment & Plan Note (Signed)
Continue her PPI twice daily, try increasing the Pepcid to twice daily as well.  She is followed by Dr. Carlean Purl, unclear whether is any real role for impedance testing or pH monitoring.  She may be maximally treated.  Question role for [facility] depending on how workup unfold

## 2022-08-06 NOTE — Patient Instructions (Addendum)
We reviewed your CT scan of the chest today.  There are areas improvement and other areas consistent with mild inflammation.  There is also scarring from sarcoidosis that has changed over the years but has been overall stable on recent scans.  We talked about possibly starting a maintenance anti-inflammatory medication to prevent progressive changes from sarcoidosis. Continue Breztri 2 puffs twice a day.  Rinse and gargle after using. Keep albuterol available to use 2 puffs up to every 4 hours if needed for shortness of breath, chest tightness, wheezing.  Continue your Flonase and loratadine as you have been taking them. Continue pantoprazole 40 mg twice a day.  Take 1 hour around food. Increase your famotidine (Pepcid) to 20 mg twice a day. Agree with following up with Dr. Carlean Purl regarding your breakthrough GERD Try using Hycodan 5 cc up to every 6 hours as you needed for cough suppression.  Please remember that this medication cause drowsiness.  Use with care. Follow with APP in 3 months Follow with Dr. Lamonte Sakai in 6 months or sooner if you have any problems.

## 2022-08-06 NOTE — Assessment & Plan Note (Signed)
Her allergic symptoms, rhinitis.  Well-controlled.  Cough is persistent, she believes she is having breakthrough GERD although she is on maximal therapy.  I will continue both her GERD and allergy regimens, try Hycodan for cough suppression.

## 2022-08-06 NOTE — Progress Notes (Signed)
HPI:    [name] 06/18/22 --66 year old woman whom I have followed for stage IV sarcoidosis with associated cavitary and scar changes on CT scan of the chest.  She also has nasal sinus disease, associated obstructive lung disease.  We have followed her cavitary lung disease and an associated left upper lobe aspergilloma with serial CT scans of the chest.  She deals with chronic cough in the setting of allergic rhinitis, GERD.  She is not on any maintenance therapy for her sarcoid.  Her most recent CT chest was 01/30/2021 as below, question some mild active inflammatory change in the right middle and right lower lobes. She has had 2 flares in the last year, this is the 3rd. Treated w pred and abx.  She returns today reporting that she has been having flaring, worsening cough beginning 2 months ago. Associated w increased, minimally productive, assoc w chest tightness. Increase SOB and decreased energy.  She had a rash about 3 months ago on her back that responded to hydrocortisone cream.  No visual changes reported  Managed on Breztri, uses albuterol about 2x a day lately, rarely if she is not flaring.  She does nasal saline washes, Flonase, loraatdine, Protonix twice daily  CT chest 01/30/2021 reviewed by me, shows stable left upper lobe cavity with 2.5 x 2.7 cm mycetoma, associated bronchiectasis in multiple lobes, honeycomb change all fairly stable.  She had a new 11 mm right upper lobe pulmonary nodule and some scattered right middle lobe and right lower lobe groundglass change.   [name] 08/06/22 --pleasant 66 year old woman with stage IV sarcoidosis characterized by widespread scarring cavitary disease on CT chest.  She has an associated left upper lobe aspergilloma, bronchiectatic change.  On her most recent scan in June 2022 there was a new 11 mm right upper lobe pulmonary nodule and some scattered right middle lobe inflammatory change.  She has chronic cough, GERD, chronic rhinitis. She has seen Dr Neldon Mc and  has considered tezepelumab (IgE normal, eosinophils slightly elevated). She is managed on Breztri.  I saw her and October and her cough was flaring.  She was having increased dyspnea, decreased energy.  She occasionally has waxing and waning rash that responds to hydrocortisone cream.  We repeated her high-resolution CT scan of the chest.  I treated her with a short course of prednisone and doxycycline but speculate that she will require a sustained course or steroid sparing immunosuppressive regimen (which she has hoped to avoid). She reports that she has been feeling "bad" over the last 3 days - malaise, some increased non-productive cough. No fever. No known sick contacts. She has been using albuterol about 2x a day. She thinks she is having breakthrough GERD even on PPI bid and pepcid.   High-resolution CT scan of the chest 07/28/2022 reviewed by me, shows bilateral upper and lower lobe reticulation with groundglass and associated honeycomb change most pronounced superiorly.  Mildly thick walled left upper lobe cavity with mycetoma unchanged.  Right upper lobe nodule and consolidative change both have resolved.  There is some new scattered groundglass opacity in left upper lobe and micronodular disease in the left lower lobe, right middle lobe.   EXAM :  Vitals:   08/06/22 1425  BP: 124/72  Pulse: 87  Temp: 98.4 F (36.9 C)  TempSrc: Oral  SpO2: 97%  Weight: 177 lb (80.3 kg)  Height: '5\' 5"'$  (1.651 m)     Gen: Pleasant, well-nourished, in no distress,  normal affect.  Cough frequently  ENT:  No lesions,  mouth clear,  oropharynx clear, no postnasal drip, strong voice  Neck: No JVD, no stridor  Lungs: No use of accessory muscles, bilateral inspiratory crackles at both bases  Cardiovascular: RRR, heart sounds normal, no murmur or gallops, no peripheral edema  Musculoskeletal: No deformities, no cyanosis or clubbing  Neuro: alert, non focal  Skin: Warm, no lesions or  rash   Sarcoidosis As documented previously I suspect that she needs maintenance antiinflammatory regimen.  She is been on methotrexate in the past, prednisone in the past.  She wants to avoid both.  Will continue to follow  We reviewed your CT scan of the chest today.  There are areas improvement and other areas consistent with mild inflammation.  There is also scarring from sarcoidosis that has changed over the years but has been overall stable on recent scans.  We talked about possibly starting a maintenance anti-inflammatory medication to prevent progressive changes from sarcoidosis. Follow with APP in 3 months Follow with Dr. Lamonte Sakai in 6 months or sooner if you have any problems.  Cough, persistent Her allergic symptoms, rhinitis.  Well-controlled.  Cough is persistent, she believes she is having breakthrough GERD although she is on maximal therapy.  I will continue both her GERD and allergy regimens, try Hycodan for cough suppression.  Gastroesophageal reflux disease without esophagitis Continue her PPI twice daily, try increasing the Pepcid to twice daily as well.  She is followed by Dr. Carlean Purl, unclear whether is any real role for impedance testing or pH monitoring.  She may be maximally treated.  Question role for Nissen depending on how workup unfold  Rhinitis, chronic Continue same regimen.  She has a mildly elevated eosinophil count.  In absence of nasal sinus symptoms unclear to me that she is a good candidate for Biologics.   Baltazar Apo, MD, PhD 08/06/2022, 2:58 PM [facility] [phone] or if no answer [phone]

## 2022-08-06 NOTE — Addendum Note (Signed)
Addended by: Gavin Potters R on: 08/06/2022 03:14 PM   Modules accepted: Orders

## 2022-08-19 ENCOUNTER — Encounter: Payer: Self-pay | Admitting: Internal Medicine

## 2022-08-21 ENCOUNTER — Ambulatory Visit (AMBULATORY_SURGERY_CENTER): Payer: 59 | Admitting: Internal Medicine

## 2022-08-21 ENCOUNTER — Encounter: Payer: Self-pay | Admitting: Internal Medicine

## 2022-08-21 VITALS — BP 103/79 | HR 65 | Temp 97.5°F | Resp 20 | Ht 65.0 in | Wt 170.0 lb

## 2022-08-21 DIAGNOSIS — D124 Benign neoplasm of descending colon: Secondary | ICD-10-CM

## 2022-08-21 DIAGNOSIS — D125 Benign neoplasm of sigmoid colon: Secondary | ICD-10-CM

## 2022-08-21 DIAGNOSIS — D120 Benign neoplasm of cecum: Secondary | ICD-10-CM | POA: Diagnosis not present

## 2022-08-21 DIAGNOSIS — Z1211 Encounter for screening for malignant neoplasm of colon: Secondary | ICD-10-CM

## 2022-08-21 MED ORDER — SODIUM CHLORIDE 0.9 % IV SOLN: 500.0000 mL | Freq: Once | INTRAVENOUS | Status: DC

## 2022-08-21 NOTE — Op Note (Signed)
[facility] Patient Name: [name] Procedure Date: 08/21/2022 11:35 AM MRN: [id number] Endoscopist: Gatha Mayer , MD, [id number] Age: 66 Referring MD:  Date of Birth: 11/02/1955 Gender: Female Account #: [id number] Procedure:                Colonoscopy Indications:              Screening for colorectal malignant neoplasm Medicines:                Monitored Anesthesia Care Procedure:                Pre-Anesthesia Assessment:                           - Prior to the procedure, a History and Physical                            was performed, and patient medications and                            allergies were reviewed. The patient's tolerance of                            previous anesthesia was also reviewed. The risks                            and benefits of the procedure and the sedation                            options and risks were discussed with the patient.                            All questions were answered, and informed consent                            was obtained. Prior Anticoagulants: The patient has                            taken no anticoagulant or antiplatelet agents. ASA                            Grade Assessment: III - A patient with severe                            systemic disease. After reviewing the risks and                            benefits, the patient was deemed in satisfactory                            condition to undergo the procedure.                           After obtaining informed consent, the colonoscope  was passed under direct vision. Throughout the                            procedure, the patient's blood pressure, pulse, and                            oxygen saturations were monitored continuously. The                            Olympus PCF-H190DL ([id number]) Colonoscope was                            introduced through the anus and advanced to the the                            cecum,  identified by appendiceal orifice and                            ileocecal valve. The colonoscopy was performed                            without difficulty. The patient tolerated the                            procedure well. The quality of the bowel                            preparation was good. The bowel preparation used                            was Miralax via split dose instruction. The                            ileocecal valve, appendiceal orifice, and rectum                            were photographed. Scope In: 11:40:21 AM Scope Out: 11:58:31 AM Scope Withdrawal Time: 0 hours 14 minutes 22 seconds  Total Procedure Duration: 0 hours 18 minutes 10 seconds  Findings:                 The perianal and digital rectal examinations were                            normal.                           Three sessile polyps were found in the proximal                            sigmoid colon, proximal descending colon and cecum.                            The polyps were 2 to 5 mm in size. These polyps  were removed with a cold snare. Resection and                            retrieval were complete. Verification of patient                            identification for the specimen was done. Estimated                            blood loss was minimal.                           A few large-mouthed diverticula were found in the                            ascending colon.                           The exam was otherwise without abnormality on                            direct and retroflexion views. Complications:            No immediate complications. Estimated Blood Loss:     Estimated blood loss was minimal. Impression:               - Three 2 to 5 mm polyps in the proximal sigmoid                            colon, in the proximal descending colon and in the                            cecum, removed with a cold snare. Resected and                             retrieved.                           - Diverticulosis in the ascending colon.                           - The examination was otherwise normal on direct                            and retroflexion views. Recommendation:           - Patient has a contact number available for                            emergencies. The signs and symptoms of potential                            delayed complications were discussed with the                            patient. Return to normal activities  tomorrow.                            Written discharge instructions were provided to the                            patient.                           - Resume previous diet.                           - Continue present medications.                           - Repeat colonoscopy is recommended. The                            colonoscopy date will be determined after pathology                            results from today's exam become available for                            review. Gatha Mayer, MD 08/21/2022 12:06:56 PM This report has been signed electronically.

## 2022-08-21 NOTE — Patient Instructions (Addendum)
YOU HAD AN ENDOSCOPIC PROCEDURE TODAY AT [facility]:   Refer to the procedure report that was given to you for any specific questions about what was found during the examination.  If the procedure report does not answer your questions, please call your gastroenterologist to clarify.  If you requested that your care partner not be given the details of your procedure findings, then the procedure report has been included in a sealed envelope for you to review at your convenience later.  YOU SHOULD EXPECT: Some feelings of bloating in the abdomen. Passage of more gas than usual.  Walking can help get rid of the air that was put into your GI tract during the procedure and reduce the bloating. If you had a lower endoscopy (such as a colonoscopy or flexible sigmoidoscopy) you may notice spotting of blood in your stool or on the toilet paper. If you underwent a bowel prep for your procedure, you may not have a normal bowel movement for a few days.  Please Note:  You might notice some irritation and congestion in your nose or some drainage.  This is from the oxygen used during your procedure.  There is no need for concern and it should clear up in a day or so.  SYMPTOMS TO REPORT IMMEDIATELY:  Following lower endoscopy (colonoscopy or flexible sigmoidoscopy):  Excessive amounts of blood in the stool  Significant tenderness or worsening of abdominal pains  Swelling of the abdomen that is new, acute  Fever of 100F or higher    For urgent or emergent issues, a gastroenterologist can be reached at any hour by calling [phone]. Do not use MyChart messaging for urgent concerns.    DIET:  We do recommend a small meal at first, but then you may proceed to your regular diet.  Drink plenty of fluids but you should avoid alcoholic beverages for 24 hours.  ACTIVITY:  You should plan to take it easy for the rest of today and you should NOT DRIVE or use heavy machinery until tomorrow (because  of the sedation medicines used during the test).    FOLLOW UP: Our staff will call the number listed on your records the next business day following your procedure.  We will call around 7:15- 8:00 am to check on you and address any questions or concerns that you may have regarding the information given to you following your procedure. If we do not reach you, we will leave a message.     If any biopsies were taken you will be contacted by phone or by letter within the next 1-3 weeks.  Please call us at [phone] if you have not heard about the biopsies in 3 weeks.    SIGNATURES/CONFIDENTIALITY: You and/or your care partner have signed paperwork which will be entered into your electronic medical record.  These signatures attest to the fact that that the information above on your After Visit Summary has been reviewed and is understood.  Full responsibility of the confidentiality of this discharge information lies with you and/or your care-partner.I found and removed three tiny polyps that look benign. You also have a condition called diverticulosis - common and not usually a problem. Please read the handout provided.  I will let you know pathology results and when to have another routine colonoscopy by mail and/or My Chart.  I appreciate the opportunity to care for you. Gatha Mayer, MD, [facility]

## 2022-08-21 NOTE — Progress Notes (Signed)
[facility] History and Physical   Primary Care Physician:  [name], NP   Reason for Procedure:   CRCA screening  Plan:    colonoscopy     HPI: [name] is a 66 y.o. female for screening exam. A Cologuard was negative in 2020. Colonoscopy 2009 - di\verticulosis, no polyps.   Past Medical History:  Diagnosis Date   Anemia    Angio-edema    Arthritis    Cataract    Chronic sinusitis    Cough    Diabetes mellitus without complication ([place])    Diverticulosis 2009   mild sigmoid   GERD (gastroesophageal reflux disease)    Glaucoma    High cholesterol    Hypertension    Insomnia    Recurrent upper respiratory infection (URI)    Sarcoidosis    Vitamin D deficiency     Past Surgical History:  Procedure Laterality Date   COLONOSCOPY  2009   LYMPHADENECTOMY     NOSE SURGERY     SINOSCOPY     TUBAL LIGATION     VESICOVAGINAL FISTULA CLOSURE W/ TAH     VIDEO BRONCHOSCOPY Bilateral 07/29/2017   Procedure: VIDEO BRONCHOSCOPY WITH FLUORO;  Surgeon: Collene Gobble, MD;  Location: [facility];  Service: Cardiopulmonary;  Laterality: Bilateral;    Prior to Admission medications   Medication Sig Start Date End Date Taking? Authorizing Provider  albuterol (PROVENTIL) (2.5 MG/3ML) 0.083% nebulizer solution Take 3 mLs (2.5 mg total) by nebulization every 4 (four) hours as needed for wheezing or shortness of breath. 03/17/22  Yes Kozlow, Donnamarie Poag, MD  albuterol (VENTOLIN HFA) 108 (90 Base) MCG/ACT inhaler Inhale 2 puffs into the lungs every 4 (four) hours as needed for wheezing or shortness of breath. 03/17/22  Yes Kozlow, Donnamarie Poag, MD  amLODipine (NORVASC) 5 MG tablet TAKE 1 TABLET BY MOUTH DAILY 07/29/22  Yes Lorretta Harp, MD  Ascorbic Acid (VITAMIN C) 1000 MG tablet Take 1,000 mg by mouth daily.   Yes [provider]  aspirin 81 MG tablet Take 81 mg by mouth daily.   Yes [provider]  Budeson-Glycopyrrol-Formoterol (BREZTRI  AEROSPHERE) 160-9-4.8 MCG/ACT AERO Inhale 2 puffs into the lungs in the morning and at bedtime. 03/17/22  Yes Kozlow, Donnamarie Poag, MD  famotidine (PEPCID) 40 MG tablet Take 1 tablet (40 mg total) by mouth at bedtime. 03/17/22  Yes Kozlow, Donnamarie Poag, MD  fenofibrate 160 MG tablet Take 160 mg by mouth daily.   Yes [provider]  loratadine (CLARITIN) 10 MG tablet Take 1 tablet (10 mg total) by mouth daily. 12/02/15  Yes Collene Gobble, MD  losartan-hydrochlorothiazide (HYZAAR) 50-12.5 MG per tablet Take 1 tablet by mouth daily.   Yes [provider]  meloxicam (MOBIC) 7.5 MG tablet Take 7.5 mg by mouth daily. 10/18/20  Yes [provider]  metoCLOPramide (REGLAN) 10 MG tablet Take 1 tablet (10 mg total) by mouth in the morning and at bedtime. 09/09/21  Yes Kozlow, Donnamarie Poag, MD  Multiple Vitamin (MULTIVITAMIN) capsule Take 1 capsule by mouth daily.   Yes [provider]  pantoprazole (PROTONIX) 40 MG tablet TAKE 1 TABLET BY MOUTH TWICE  DAILY 06/11/22  Yes Kozlow, Donnamarie Poag, MD  Vitamin D, Ergocalciferol, (DRISDOL) 1.25 MG (50000 UNIT) CAPS capsule Take 50,000 Units by mouth every 7 (seven) days. 10/18/20  Yes [provider]  aspirin-acetaminophen-caffeine (EXCEDRIN MIGRAINE) [phone] MG per tablet Take 1 tablet by mouth every 6 (six) hours  as needed for pain or migraine.    [provider]  atorvastatin (LIPITOR) 40 MG tablet Take 1 tablet (40 mg total) by mouth daily. 02/16/20 01/06/22  Lorretta Harp, MD  fluticasone (FLONASE) 50 MCG/ACT nasal spray Place 1 spray into both nostrils daily. USE 1 SPRAY INTO BOTH  NOSTRILS DAILY FOLLOWING NASAL [facility] 03/17/22   Kozlow, Donnamarie Poag, MD  HYDROcodone bit-homatropine (HYCODAN) 5-1.5 MG/5ML syrup Take 5 mLs by mouth every 6 (six) hours as needed for cough. 08/06/22   Collene Gobble, MD  ibuprofen (ADVIL,MOTRIN) 200 MG tablet Take 400 mg by mouth every 6 (six) hours as needed for mild pain.    [provider]   levocetirizine (XYZAL) 5 MG tablet Take 1 tablet (5 mg total) by mouth daily as needed for allergies (Can take an extra dose during flare ups.). 03/17/22   Kozlow, Donnamarie Poag, MD    Current Outpatient Medications  Medication Sig Dispense Refill   albuterol (PROVENTIL) (2.5 MG/3ML) 0.083% nebulizer solution Take 3 mLs (2.5 mg total) by nebulization every 4 (four) hours as needed for wheezing or shortness of breath. 150 mL 2   albuterol (VENTOLIN HFA) 108 (90 Base) MCG/ACT inhaler Inhale 2 puffs into the lungs every 4 (four) hours as needed for wheezing or shortness of breath. 18 g 1   amLODipine (NORVASC) 5 MG tablet TAKE 1 TABLET BY MOUTH DAILY 90 tablet 0   Ascorbic Acid (VITAMIN C) 1000 MG tablet Take 1,000 mg by mouth daily.     aspirin 81 MG tablet Take 81 mg by mouth daily.     Budeson-Glycopyrrol-Formoterol (BREZTRI AEROSPHERE) 160-9-4.8 MCG/ACT AERO Inhale 2 puffs into the lungs in the morning and at bedtime. 32.1 g 1   famotidine (PEPCID) 40 MG tablet Take 1 tablet (40 mg total) by mouth at bedtime. 90 tablet 1   fenofibrate 160 MG tablet Take 160 mg by mouth daily.     loratadine (CLARITIN) 10 MG tablet Take 1 tablet (10 mg total) by mouth daily. 90 tablet 0   losartan-hydrochlorothiazide (HYZAAR) 50-12.5 MG per tablet Take 1 tablet by mouth daily.     meloxicam (MOBIC) 7.5 MG tablet Take 7.5 mg by mouth daily.     metoCLOPramide (REGLAN) 10 MG tablet Take 1 tablet (10 mg total) by mouth in the morning and at bedtime. 180 tablet 1   Multiple Vitamin (MULTIVITAMIN) capsule Take 1 capsule by mouth daily.     pantoprazole (PROTONIX) 40 MG tablet TAKE 1 TABLET BY MOUTH TWICE  DAILY 180 tablet 3   Vitamin D, Ergocalciferol, (DRISDOL) 1.25 MG (50000 UNIT) CAPS capsule Take 50,000 Units by mouth every 7 (seven) days.     aspirin-acetaminophen-caffeine (EXCEDRIN MIGRAINE) 250-250-65 MG per tablet Take 1 tablet by mouth every 6 (six) hours as needed for pain or migraine.     atorvastatin (LIPITOR)  40 MG tablet Take 1 tablet (40 mg total) by mouth daily. 90 tablet 3   fluticasone (FLONASE) 50 MCG/ACT nasal spray Place 1 spray into both nostrils daily. USE 1 SPRAY INTO BOTH  NOSTRILS DAILY FOLLOWING NASAL WASH 48 g 1   HYDROcodone bit-homatropine (HYCODAN) 5-1.5 MG/5ML syrup Take 5 mLs by mouth every 6 (six) hours as needed for cough. 480 mL 0   ibuprofen (ADVIL,MOTRIN) 200 MG tablet Take 400 mg by mouth every 6 (six) hours as needed for mild pain.     levocetirizine (XYZAL) 5 MG tablet Take 1 tablet (5 mg total) by mouth daily as  needed for allergies (Can take an extra dose during flare ups.). 180 tablet 1   Current Facility-Administered Medications  Medication Dose Route Frequency Provider Last Rate Last Admin   0.9 %  sodium chloride infusion  500 mL Intravenous Once Gatha Mayer, MD        Allergies as of 08/21/2022   (No Known Allergies)    Family History  Problem Relation Age of Onset   Stroke Father    Heart murmur Father    Diabetes Father    Emphysema Mother    COPD Mother    Breast cancer Sister    Colon cancer Neg Hx    Esophageal cancer Neg Hx    Rectal cancer Neg Hx    Urticaria Neg Hx    Eczema Neg Hx    Immunodeficiency Neg Hx    Atopy Neg Hx    Asthma Neg Hx    Angioedema Neg Hx    Allergic rhinitis Neg Hx    Dementia Neg Hx        none that she knows of    Alzheimer's disease Neg Hx        none that she knows of    Memory loss Neg Hx        none that she knows of     Social History   Socioeconomic History   Marital status: Divorced    Spouse name: Not on file   Number of children: 2   Years of education: college   Highest education level: Not on file  Occupational History   Occupation: CONSULTANT    Employer: [employer]  Tobacco Use   Smoking status: Never   Smokeless tobacco: Never  Vaping Use   Vaping Use: Never used  Substance and Sexual Activity   Alcohol use: No   Drug use: No   Sexual activity: Not Currently     Partners: Male  Other Topics Concern   Not on file  Social History Narrative   Patient lives at home alone.  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  Physical Exam: Vital signs BP 130/81   Pulse 68   Temp (!) 97.5 F (36.4 C)   Resp 16   Ht '5\' 5"'$  (1.651 m)   Wt 170 lb (77.1 kg)   SpO2 98%   BMI 28.29 kg/m   General:   Alert,  Well-developed, well-nourished, pleasant and cooperative in NAD Lungs:  Clear throughout to auscultation.   Heart:  Regular rate and rhythm; no murmurs, clicks, rubs,  or gallops. Abdomen:  Soft, nontender and nondistended. Normal bowel sounds.   Neuro/Psych:  Alert and cooperative. Normal mood and affect. A and O x 3   '@Gerrard Crystal'$  Simonne Maffucci, MD, [facility] [phone] (pager) 08/21/2022 11:33 AM@

## 2022-08-21 NOTE — Progress Notes (Signed)
A and O x3. Report to RN. Tolerated MAC anesthesia well. 

## 2022-08-21 NOTE — Progress Notes (Signed)
Called to room to assist during endoscopic procedure.  Patient ID and intended procedure confirmed with present staff. Received instructions for my participation in the procedure from the performing physician.  

## 2022-08-26 ENCOUNTER — Telehealth: Payer: Self-pay | Admitting: *Deleted

## 2022-08-26 NOTE — Telephone Encounter (Signed)
  Follow up Call-     08/21/2022   11:09 AM  Call back number  Post procedure Call Back phone  # [phone]  Permission to leave phone message Yes     Patient questions:  Do you have a fever, pain , or abdominal swelling? No. Pain Score  0 *  Have you tolerated food without any problems? Yes.    Have you been able to return to your normal activities? Yes.    Do you have any questions about your discharge instructions: Diet   No. Medications  No. Follow up visit  No.  Do you have questions or concerns about your Care? No.  Actions: * If pain score is 4 or above: No action needed, pain <4.

## 2022-09-04 ENCOUNTER — Encounter: Payer: Self-pay | Admitting: Internal Medicine

## 2022-09-04 DIAGNOSIS — Z86010 Personal history of colonic polyps: Secondary | ICD-10-CM

## 2022-09-04 DIAGNOSIS — Z860101 Personal history of adenomatous and serrated colon polyps: Secondary | ICD-10-CM

## 2022-09-04 HISTORY — DX: Personal history of adenomatous and serrated colon polyps: Z86.0101

## 2022-09-15 ENCOUNTER — Other Ambulatory Visit: Payer: Self-pay | Admitting: Allergy and Immunology

## 2022-09-29 ENCOUNTER — Encounter: Payer: Self-pay | Admitting: Allergy and Immunology

## 2022-09-29 ENCOUNTER — Ambulatory Visit (INDEPENDENT_AMBULATORY_CARE_PROVIDER_SITE_OTHER): Payer: 59 | Admitting: Allergy and Immunology

## 2022-09-29 ENCOUNTER — Other Ambulatory Visit: Payer: Self-pay

## 2022-09-29 VITALS — BP 136/84 | HR 72 | Temp 98.5°F | Resp 17

## 2022-09-29 DIAGNOSIS — J3089 Other allergic rhinitis: Secondary | ICD-10-CM

## 2022-09-29 DIAGNOSIS — K219 Gastro-esophageal reflux disease without esophagitis: Secondary | ICD-10-CM

## 2022-09-29 DIAGNOSIS — J4550 Severe persistent asthma, uncomplicated: Secondary | ICD-10-CM | POA: Diagnosis not present

## 2022-09-29 DIAGNOSIS — D860 Sarcoidosis of lung: Secondary | ICD-10-CM | POA: Diagnosis not present

## 2022-09-29 DIAGNOSIS — J3489 Other specified disorders of nose and nasal sinuses: Secondary | ICD-10-CM

## 2022-09-29 MED ORDER — METOCLOPRAMIDE HCL 10 MG PO TABS: 10.0000 mg | ORAL_TABLET | Freq: Two times a day (BID) | ORAL | 1 refills | Status: DC

## 2022-09-29 MED ORDER — BREZTRI AEROSPHERE 160-9-4.8 MCG/ACT IN AERO: 2.0000 | INHALATION_SPRAY | Freq: Two times a day (BID) | RESPIRATORY_TRACT | 1 refills | Status: DC

## 2022-09-29 MED ORDER — ALBUTEROL SULFATE HFA 108 (90 BASE) MCG/ACT IN AERS: 2.0000 | INHALATION_SPRAY | RESPIRATORY_TRACT | 1 refills | Status: DC | PRN

## 2022-09-29 NOTE — Progress Notes (Signed)
[facility]   Follow-up Note  Referring Provider: [name], NP Primary Provider: [name], NP Date of Office Visit: 09/29/2022  Subjective:   [name] (DOB: [birth year]) is a 67 y.o. female who returns to the [facility] [place] on 09/29/2022 in re-evaluation of the following:  HPI: [name] returns to this clinic in evaluation of severe asthma, sarcoidosis, rhinitis, LPR.  I last saw her in this clinic 17 March 2022.  She continues to have chronic cough and intermittent episodes of shortness of breath and issues tied up with mucus production.  When she finally can clear up her chest after coughing out globs of mucus she feels much better and much less shortness of breath.  He respiratory issues occur in the face of utilizing Breztri on a consistent basis.  We attempted to start her on tezepelumab during her last visit but for some reason logistics broke down on that request.  She has visit with her pulmonologist, Dr. Lamonte Sakai, on 06 August 2022.  It seems as though the assessment regarding her sarcoidosis based upon a CT scan that was performed in December 2023 and clinical assessment is that she is stable at this point but consideration for a long-acting maintenance program for sarcoid is still a consideration.  She thinks that her reflux was doing relatively well but unfortunately it has flared.  This flare correlates temporally with her insurance denying the use of famotidine and she ran out of metoclopramide.  She continues to use pantoprazole twice a day.  She has obtained this years flu vaccine.  She did not obtain an RSV vaccine.  Allergies as of 09/29/2022   No Known Allergies      Medication List    albuterol 108 (90 Base) MCG/ACT inhaler Commonly known as: VENTOLIN HFA Inhale 2 puffs into the lungs every 4 (four) hours as needed for wheezing or shortness of breath.   albuterol (2.5 MG/3ML)  0.083% nebulizer solution Commonly known as: PROVENTIL Take 3 mLs (2.5 mg total) by nebulization every 4 (four) hours as needed for wheezing or shortness of breath.   amLODipine 5 MG tablet Commonly known as: NORVASC TAKE 1 TABLET BY MOUTH DAILY   aspirin 81 MG tablet Take 81 mg by mouth daily.   aspirin-acetaminophen-caffeine 250-250-65 MG tablet Commonly known as: EXCEDRIN MIGRAINE Take 1 tablet by mouth every 6 (six) hours as needed for pain or migraine.   atorvastatin 40 MG tablet Commonly known as: LIPITOR Take 1 tablet (40 mg total) by mouth daily.   Breztri Aerosphere 160-9-4.8 MCG/ACT Aero Generic drug: Budeson-Glycopyrrol-Formoterol Inhale 2 puffs into the lungs in the morning and at bedtime.   famotidine 40 MG tablet Commonly known as: PEPCID TAKE 1 TABLET(40 MG) BY MOUTH AT BEDTIME   fenofibrate 160 MG tablet Take 160 mg by mouth daily.   fluticasone 50 MCG/ACT nasal spray Commonly known as: FLONASE PLACE 1 SPRAY INTO BOTH NOSTRILS DAILY.   HYDROcodone bit-homatropine 5-1.5 MG/5ML syrup Commonly known as: HYCODAN Take 5 mLs by mouth every 6 (six) hours as needed for cough.   ibuprofen 200 MG tablet Commonly known as: ADVIL Take 400 mg by mouth every 6 (six) hours as needed for mild pain.   loratadine 10 MG tablet Commonly known as: CLARITIN Take 1 tablet (10 mg total) by mouth daily.   losartan-hydrochlorothiazide 50-12.5 MG tablet Commonly known as: HYZAAR Take 1 tablet by mouth daily.   meloxicam 7.5 MG tablet  Commonly known as: MOBIC Take 7.5 mg by mouth daily.   metoCLOPramide 10 MG tablet Commonly known as: REGLAN Take 1 tablet (10 mg total) by mouth in the morning and at bedtime.   multivitamin capsule Take 1 capsule by mouth daily.   pantoprazole 40 MG tablet Commonly known as: PROTONIX TAKE 1 TABLET BY MOUTH TWICE  DAILY   vitamin C 1000 MG tablet Take 1,000 mg by mouth daily.   Vitamin D (Ergocalciferol) 1.25 MG (50000 UNIT) Caps  capsule Commonly known as: DRISDOL Take 50,000 Units by mouth every 7 (seven) days.    Past Medical History:  Diagnosis Date   Anemia    Angio-edema    Arthritis    Cataract    Chronic sinusitis    Cough    Diabetes mellitus without complication ([place])    Diverticulosis 2009   mild sigmoid   GERD (gastroesophageal reflux disease)    Glaucoma    High cholesterol    Hx of adenomatous and sessile serrated colonic polyps 09/04/2022   12/23 1 diminutive ssp and 1 diminutive adenoma (and 1 distal hpp) - recall 2030-31   Hypertension    Insomnia    Recurrent upper respiratory infection (URI)    Sarcoidosis    Vitamin D deficiency     Past Surgical History:  Procedure Laterality Date   COLONOSCOPY  2009   LYMPHADENECTOMY     NOSE SURGERY     SINOSCOPY     TUBAL LIGATION     VESICOVAGINAL FISTULA CLOSURE W/ TAH     VIDEO BRONCHOSCOPY Bilateral 07/29/2017   Procedure: VIDEO BRONCHOSCOPY WITH FLUORO;  Surgeon: Collene Gobble, MD;  Location: [facility];  Service: Cardiopulmonary;  Laterality: Bilateral;    Review of systems negative except as noted in HPI / PMHx or noted below:  Review of Systems  Constitutional: Negative.   HENT: Negative.    Eyes: Negative.   Respiratory: Negative.    Cardiovascular: Negative.   Gastrointestinal: Negative.   Genitourinary: Negative.   Musculoskeletal: Negative.   Skin: Negative.   Neurological: Negative.   Endo/Heme/Allergies: Negative.   Psychiatric/Behavioral: Negative.       Objective:   Vitals:   09/30/22 1000  BP: 136/84  Pulse: 72  Resp: 17  Temp: 98.5 F (36.9 C)  SpO2: 97%          Physical Exam Constitutional:      Appearance: She is not diaphoretic.     Comments: Coughing  HENT:     Head: Normocephalic.     Right Ear: Tympanic membrane, ear canal and external ear normal.     Left Ear: Tympanic membrane, ear canal and external ear normal.     Nose: Nose normal. No mucosal edema or rhinorrhea.      Mouth/Throat:     Pharynx: Uvula midline. No oropharyngeal exudate.  Eyes:     Conjunctiva/sclera: Conjunctivae normal.  Neck:     Thyroid: No thyromegaly.     Trachea: Trachea normal. No tracheal tenderness or tracheal deviation.  Cardiovascular:     Rate and Rhythm: Normal rate and regular rhythm.     Heart sounds: Normal heart sounds, S1 normal and S2 normal. No murmur heard. Pulmonary:     Effort: No respiratory distress.     Breath sounds: Normal breath sounds. No stridor. No wheezing or rales.  Lymphadenopathy:     Head:     Right side of head: No tonsillar adenopathy.     Left side of  head: No tonsillar adenopathy.     Cervical: No cervical adenopathy.  Skin:    Findings: No erythema or rash.     Nails: There is no clubbing.  Neurological:     Mental Status: She is alert.     Diagnostics:    Spirometry was performed and demonstrated an FEV1 of 0.96 at 46 % of predicted.  The patient had an Asthma Control Test with the following results: ACT Total Score: 16.    Results of blood tests obtained 17 March 2022 identifies WBC 6.0, absolute eosinophil 700, absolute lymphocyte 1800, hemoglobin 12.2, platelet 242, IgE 118 KU/L  Results of a high-resolution chest CT scan obtained 28 July 2022 identified the following:  Cardiovascular: Heart is upper limits of normal in size. Trace pericardial effusion. Normal caliber thoracic aorta with mild calcified plaque. Severe coronary artery calcifications of the LAD.   Mediastinum/Nodes: Enlarged mediastinal and hilar lymph nodes, many of which are calcified, unchanged when compared with prior exam. Unchanged enlarged left axillary lymph nodes. Patulous esophagus. Thyroid is unremarkable.   Lungs/Pleura: Central airways are patent. No significant air trapping. Peribronchovascular and subpleural reticular and ground-glass opacities with associated honeycomb change, most pronounced in the bilateral upper lobes and lower lungs. No  evidence of progressive fibrotic changes compared with most recent prior. Mildly thick-walled cavity of the left upper lobe containing rounded soft tissue measuring 2.7 x 2.5 cm unchanged when compared with prior exam. Resolution right lobe consolidation and right upper lobe pulmonary nodule. New scattered ground-glass opacities of the left upper lobe. New solid pulmonary nodule of the left lower lobe measuring 4 mm on series 10, image 195 and new irregular solid nodule of the right middle lobe measuring 11 x 8 mm on series 10, image 113 reference left upper lobe nodule measuring 10 mm with mild no pleural effusion or pneumothorax.  Assessment and Plan:   1. Not well controlled severe persistent asthma   2. Perennial allergic rhinitis   3. LPRD (laryngopharyngeal reflux disease)   4. Sarcoidosis of lung ([place])   5. Nasal septal perforation    1.  Continue to perform allergen avoidance measures - house dust mite  2.  Continue to treat inflammation of airway:   A.  Breztri - 2 inhalations 2 times a day  B.  Flonase 1 spray each nostril only 1 time per day.      3.  Continue to treat reflux:   A.  Eliminate use of fish oil, caffeine, chocolate  B.  Raise head of bed  C.  No late meals  D.  Pantoprazole 40 mg twice a day  E.  OTC Famotidine 20 mg  two tablets in evening (restart)  F.  Metoclopramide 10 mg in evening (restart)  5.  If needed:   A.  OTC antihistamine   B.  Albuterol HFA-2 inhalations once a day  6. Return to clinic in 6 months or earlier if problem  7. Submit for tezepelumab administration ([facility])  8. Start Respitech percussion vest  Dollie has significant irritation and inflammation of her airway and she has several processes giving rise to this issue including some eosinophilic infiltration of her airway and sarcoidosis with possible active status but certainly architectural changes as a result of that inflammatory process in the past and reflux.  We will  attempt to address all of these issues short of giving her immunosuppressive drugs for her sarcoid as that would be a treatment plan directed within the realm of  her pulmonologist.  We can add in anti-TSLP antibody to address the inflammation and we can get her percussion vest to see if this helps remove some of the mucus from her airway and she will continue on a collection of anti-inflammatory agents for airway and aggressively treat her reflux.  Allena Katz, MD Allergy / Immunology [place]

## 2022-09-29 NOTE — Patient Instructions (Addendum)
  1.  Continue to perform allergen avoidance measures - house dust mite  2.  Continue to treat inflammation of airway:   A.  Breztri - 2 inhalations 2 times a day  B.  Flonase 1 spray each nostril only 1 time per day.      3.  Continue to treat reflux:   A.  Eliminate use of fish oil, caffeine, chocolate  B.  Raise head of bed  C.  No late meals  D.  Pantoprazole 40 mg twice a day  E.  OTC Famotidine 20 mg  two tablets in evening  F.  Metoclopramide 10 mg in evening ([facility])  5.  If needed:   A.  OTC antihistamine   B.  Albuterol HFA-2 inhalations once a day  6. Return to clinic in 6 months or earlier if problem  7. Submit for tezepelumab administration (Tammy)  8. Start Respitech percussion vest

## 2022-10-01 ENCOUNTER — Telehealth: Payer: Self-pay | Admitting: *Deleted

## 2022-10-01 NOTE — Telephone Encounter (Signed)
Called patient and advised approval, copay card and submit to [facility] for Tezspire autoinjector. Patient wants to admin at home so I gave her instrux on delivery, storage, dosing for same and initial appt in clinic

## 2022-10-01 NOTE — Telephone Encounter (Signed)
-----   Message from Jiles Prows, MD sent at 09/30/2022  7:07 AM EST ----- Teze. Not sure what happened last time.

## 2022-10-02 ENCOUNTER — Telehealth: Payer: Self-pay

## 2022-10-02 NOTE — Telephone Encounter (Signed)
Patient advised of Dr Neldon Mc info regarding vest

## 2022-10-02 NOTE — Telephone Encounter (Signed)
PA request received via CMM for Breztri Aerosphere 160-9-4.8MCG/ACT aerosol  PA has been submitted to [employer] and is pending determination.   Key: [facility]

## 2022-10-05 ENCOUNTER — Other Ambulatory Visit: Payer: Self-pay | Admitting: Cardiovascular Disease

## 2022-10-05 DIAGNOSIS — I10 Essential (primary) hypertension: Secondary | ICD-10-CM

## 2022-10-06 ENCOUNTER — Other Ambulatory Visit (HOSPITAL_COMMUNITY): Payer: Self-pay

## 2022-10-06 NOTE — Telephone Encounter (Signed)
Patient Advocate Encounter  Received a fax from [employer] regarding Prior Authorization for [employer] 160-9-4.8MCG/ACT aerosol.   Authorization has been DENIED due to

## 2022-10-07 ENCOUNTER — Encounter: Payer: Self-pay | Admitting: Allergy and Immunology

## 2022-10-12 ENCOUNTER — Ambulatory Visit (INDEPENDENT_AMBULATORY_CARE_PROVIDER_SITE_OTHER): Payer: 59

## 2022-10-12 ENCOUNTER — Other Ambulatory Visit: Payer: Self-pay

## 2022-10-12 DIAGNOSIS — J4550 Severe persistent asthma, uncomplicated: Secondary | ICD-10-CM | POA: Diagnosis not present

## 2022-10-12 MED ORDER — TEZEPELUMAB-EKKO 210 MG/1.91ML ~~LOC~~ SOSY
210.0000 mg | PREFILLED_SYRINGE | Freq: Once | SUBCUTANEOUS | Status: AC
  Administered 2022-10-12: 210 mg via SUBCUTANEOUS

## 2022-10-12 MED ORDER — FAMOTIDINE 40 MG PO TABS: 40.0000 mg | ORAL_TABLET | Freq: Every day | ORAL | 1 refills | Status: DC

## 2022-10-12 NOTE — Progress Notes (Signed)
Immunotherapy   Patient Details  Name: [name] MRN: [id number] Date of Birth: [birth year]  10/12/2022  [name] started injections for  Tezspire  Frequency: every 4 weeks  patient instructions given. Patient administered injection in office and plans to do the next dose at home.  Patient waited 30 minutes in office with no reaction.    Larence Penning 10/12/2022, 5:04 PM

## 2022-11-01 ENCOUNTER — Other Ambulatory Visit: Payer: Self-pay | Admitting: Cardiovascular Disease

## 2022-11-01 DIAGNOSIS — I10 Essential (primary) hypertension: Secondary | ICD-10-CM

## 2022-11-05 ENCOUNTER — Ambulatory Visit (INDEPENDENT_AMBULATORY_CARE_PROVIDER_SITE_OTHER): Payer: 59 | Admitting: Adult Health

## 2022-11-05 ENCOUNTER — Encounter: Payer: Self-pay | Admitting: Adult Health

## 2022-11-05 VITALS — BP 110/76 | HR 82 | Temp 98.5°F | Ht 65.0 in | Wt 172.6 lb

## 2022-11-05 DIAGNOSIS — D869 Sarcoidosis, unspecified: Secondary | ICD-10-CM

## 2022-11-05 DIAGNOSIS — J310 Chronic rhinitis: Secondary | ICD-10-CM

## 2022-11-05 DIAGNOSIS — K219 Gastro-esophageal reflux disease without esophagitis: Secondary | ICD-10-CM

## 2022-11-05 DIAGNOSIS — J4550 Severe persistent asthma, uncomplicated: Secondary | ICD-10-CM | POA: Diagnosis not present

## 2022-11-05 NOTE — Patient Instructions (Addendum)
Continue Breztri 2 puffs twice a day.  Rinse and gargle after using. Keep albuterol available to use 2 puffs up to every 4 hours if needed for shortness of breath, chest tightness, wheezing.  Continue your Flonase and loratadine as you have been taking them. Continue pantoprazole 40 mg twice a day.  Take 1 hour around food. Continue on Pepcid Twice daily  .  Robitussin As needed  cough .  Follow up with Dr. Lamonte Sakai  in 3 months and As needed

## 2022-11-05 NOTE — Progress Notes (Signed)
Received the [place] vaccines and 1 pfizer vaccines.

## 2022-11-05 NOTE — Progress Notes (Signed)
@Patient  ID: [name], female    DOB: 1956/08/05, 67 y.o.   MRN: [id number]  Chief Complaint  Patient presents with   Follow-up    Referring provider: [name], NP  HPI: 67 year old female followed for stage IV sarcoidosis characterized by widespread scarring and cavitary disease on CT scan has an associated left upper lobe aspergilloma and bronchiectatic changes.  Followed for Severe persistent Asthma by Dr. Neldon Mc [facility]   TEST/EVENTS :  High-resolution CT scan of the chest 07/28/2022  shows bilateral upper and lower lobe reticulation with groundglass and associated honeycomb change most pronounced superiorly.  Mildly thick walled left upper lobe cavity with mycetoma unchanged.  Right upper lobe nodule and consolidative change both have resolved.  There is some new scattered groundglass opacity in left upper lobe and micronodular disease in the left lower lobe, right middle lobe.   Spirometry FEV1 of 0.96 at 46 % of predicted.   July 2023 identifies WBC 6.0, absolute eosinophil 700, absolute lymphocyte 1800, hemoglobin 12.2, platelet 242, IgE 118 KU/L  11/05/22 Follow up : Sarcoid , Asthma  Patient returns for a 67-month follow-up.  Patient says overall she is doing about the same.  She gets short of breath with heavy activities.  Her cough is at baseline she has a daily cough with worsening congestion at nighttime.  After she coughs up mucus she does feel much better.  She denies any fever, discolored mucus hemoptysis or chest pain.  She is followed by asthma and allergy with Dr. Neldon Mc.  She is recently started on Tezspire injections.  She complains of joint aches with injection.   Patient says she is going to become more active as the weather improves  Wants to start back walking.    No Known Allergies  Immunization History  Administered Date(s) Administered   Influenza Split 06/21/2011, 05/23/2012, 05/24/2013   Influenza Whole 05/24/2010    Influenza,inj,Quad PF,6+ Mos 06/07/2015, 06/05/2016, 05/17/2017, 06/02/2018, 06/23/2019, 05/17/2020   Influenza-Unspecified 07/08/2022   Pneumococcal Conjugate-13 06/07/2015   Respiratory Syncytial Virus Vaccine,Recomb Aduvanted(Arexvy) 08/06/2022    Past Medical History:  Diagnosis Date   Anemia    Angio-edema    Arthritis    Cataract    Chronic sinusitis    Cough    Diabetes mellitus without complication ([place])    Diverticulosis 2009   mild sigmoid   GERD (gastroesophageal reflux disease)    Glaucoma    High cholesterol    Hx of adenomatous and sessile serrated colonic polyps 09/04/2022   12/23 1 diminutive ssp and 1 diminutive adenoma (and 1 distal hpp) - recall 2030-31   Hypertension    Insomnia    Recurrent upper respiratory infection (URI)    Sarcoidosis    Vitamin D deficiency     Tobacco History: Social History   Tobacco Use  Smoking Status Never  Smokeless Tobacco Never   Counseling given: Not Answered   Outpatient Medications Prior to Visit  Medication Sig Dispense Refill   albuterol (PROVENTIL) (2.5 MG/3ML) 0.083% nebulizer solution Take 3 mLs (2.5 mg total) by nebulization every 4 (four) hours as needed for wheezing or shortness of breath. 150 mL 2   albuterol (VENTOLIN HFA) 108 (90 Base) MCG/ACT inhaler Inhale 2 puffs into the lungs every 4 (four) hours as needed for wheezing or shortness of breath. 18 g 1   amLODipine (NORVASC) 5 MG tablet Take 1 tablet (5 mg total) by mouth daily. NEED OV. 30 tablet 0   Ascorbic  Acid (VITAMIN C) 1000 MG tablet Take 1,000 mg by mouth daily.     aspirin 81 MG tablet Take 81 mg by mouth daily.     aspirin-acetaminophen-caffeine (EXCEDRIN MIGRAINE) 250-250-65 MG per tablet Take 1 tablet by mouth every 6 (six) hours as needed for pain or migraine.     Budeson-Glycopyrrol-Formoterol (BREZTRI AEROSPHERE) 160-9-4.8 MCG/ACT AERO Inhale 2 puffs into the lungs in the morning and at bedtime. 32.1 g 1   famotidine (PEPCID) 40 MG  tablet Take 1 tablet (40 mg total) by mouth at bedtime. 90 tablet 1   fenofibrate 160 MG tablet Take 160 mg by mouth daily.     fluticasone (FLONASE) 50 MCG/ACT nasal spray PLACE 1 SPRAY INTO BOTH NOSTRILS DAILY. 48 g 1   HYDROcodone bit-homatropine (HYCODAN) 5-1.5 MG/5ML syrup Take 5 mLs by mouth every 6 (six) hours as needed for cough. 480 mL 0   ibuprofen (ADVIL,MOTRIN) 200 MG tablet Take 400 mg by mouth every 6 (six) hours as needed for mild pain.     loratadine (CLARITIN) 10 MG tablet Take 1 tablet (10 mg total) by mouth daily. 90 tablet 0   losartan-hydrochlorothiazide (HYZAAR) 50-12.5 MG per tablet Take 1 tablet by mouth daily.     meloxicam (MOBIC) 7.5 MG tablet Take 7.5 mg by mouth daily.     metoCLOPramide (REGLAN) 10 MG tablet Take 1 tablet (10 mg total) by mouth in the morning and at bedtime. 180 tablet 1   Multiple Vitamin (MULTIVITAMIN) capsule Take 1 capsule by mouth daily.     pantoprazole (PROTONIX) 40 MG tablet TAKE 1 TABLET BY MOUTH TWICE  DAILY 180 tablet 3   TEZSPIRE 210 MG/1.91ML SOAJ Inject 210 mg into the skin every 28 (twenty-eight) days.     Vitamin D, Ergocalciferol, (DRISDOL) 1.25 MG (50000 UNIT) CAPS capsule Take 50,000 Units by mouth every 7 (seven) days.     atorvastatin (LIPITOR) 40 MG tablet Take 1 tablet (40 mg total) by mouth daily. 90 tablet 3   No facility-administered medications prior to visit.     Review of Systems:   Constitutional:   No  weight loss, night sweats,  Fevers, chills,  +fatigue, or  lassitude.  HEENT:   No headaches,  Difficulty swallowing,  Tooth/dental problems, or  Sore throat,                No sneezing, itching, ear ache, nasal congestion, post nasal drip,   CV:  No chest pain,  Orthopnea, PND, swelling in lower extremities, anasarca, dizziness, palpitations, syncope.   GI  No heartburn, indigestion, abdominal pain, nausea, vomiting, diarrhea, change in bowel habits, loss of appetite, bloody stools.   Resp:   No chest wall  deformity  Skin: no rash or lesions.  GU: no dysuria, change in color of urine, no urgency or frequency.  No flank pain, no hematuria   MS:  No joint pain or swelling.  No decreased range of motion.  No back pain.    Physical Exam  BP 110/76 (BP Location: Left Arm, Patient Position: Sitting, Cuff Size: Normal)   Pulse 82   Temp 98.5 F (36.9 C) (Oral)   Ht 5\' 5"  (1.651 m)   Wt 172 lb 9.6 oz (78.3 kg)   SpO2 95%   BMI 28.72 kg/m   GEN: A/Ox3; pleasant , NAD, well nourished    HEENT:  [place]/AT,   NOSE-clear, THROAT-clear, no lesions, no postnasal drip or exudate noted.   NECK:  Supple w/ fair  ROM; no JVD; normal carotid impulses w/o bruits; no thyromegaly or nodules palpated; no lymphadenopathy.    RESP  Clear  P & A; w/o, wheezes/ rales/ or rhonchi. no accessory muscle use, no dullness to percussion  CARD:  RRR, no m/r/g, no peripheral edema, pulses intact, no cyanosis or clubbing.  GI:   Soft & nt; nml bowel sounds; no organomegaly or masses detected.   Musco: Warm bil, no deformities or joint swelling noted.   Neuro: alert, no focal deficits noted.    Skin: Warm, no lesions or rashes    Lab Results:    BMET   BNP No results found for: "BNP"  ProBNP No results found for: "PROBNP"  Imaging: No results found.  tezepelumab-ekko (TEZSPIRE) 210 MG/1.91ML syringe 210 mg     Date Action Dose Route User   10/12/2022 1509 Given 210 mg Subcutaneous (Right Upper Abdomen) Larence Penning, CMA          Latest Ref Rng & Units 01/13/2017    3:04 PM  PFT Results  FVC-Pre L 2.20   FVC-Predicted Pre % 76   FVC-Post L 2.16   FVC-Predicted Post % 75   Pre FEV1/FVC % % 83   Post FEV1/FCV % % 89   FEV1-Pre L 1.83   FEV1-Predicted Pre % 81   FEV1-Post L 1.91   DLCO uncorrected ml/min/mmHg 15.18   DLCO UNC% % 57   DLCO corrected ml/min/mmHg 14.74   DLCO COR %Predicted % 56   DLVA Predicted % 84   TLC L 3.70   TLC % Predicted % 70   RV % Predicted % 66      No results found for: "NITRICOXIDE"      Assessment & Plan:   No problem-specific Assessment & Plan notes found for this encounter.     Rexene Edison, NP 11/05/2022

## 2022-11-06 ENCOUNTER — Encounter: Payer: Self-pay | Admitting: Adult Health

## 2022-11-06 DIAGNOSIS — J45909 Unspecified asthma, uncomplicated: Secondary | ICD-10-CM

## 2022-11-06 HISTORY — DX: Unspecified asthma, uncomplicated: J45.909

## 2022-11-06 NOTE — Assessment & Plan Note (Signed)
Continue on current regimen with Breztri twice daily.  Trigger prevention.  Continue follow-up with asthma and allergy.  Patient has recently started on Tezspire Injections   Plan  Patient Instructions  Continue Breztri 2 puffs twice a day.  Rinse and gargle after using. Keep albuterol available to use 2 puffs up to every 4 hours if needed for shortness of breath, chest tightness, wheezing.  Continue your Flonase and loratadine as you have been taking them. Continue pantoprazole 40 mg twice a day.  Take 1 hour around food. Continue on Pepcid Twice daily  .  Robitussin As needed  cough .  Follow up with Dr. Lamonte Sakai  in 3 months and As needed    '

## 2022-11-06 NOTE — Assessment & Plan Note (Signed)
Continue on current regimen .   

## 2022-11-06 NOTE — Assessment & Plan Note (Signed)
Clinically appears to be stable.  Will continue to follow closely.  Patient has stage IV sarcoidosis with widespread scarring.  Plan  Patient Instructions  Continue Breztri 2 puffs twice a day.  Rinse and gargle after using. Keep albuterol available to use 2 puffs up to every 4 hours if needed for shortness of breath, chest tightness, wheezing.  Continue your Flonase and loratadine as you have been taking them. Continue pantoprazole 40 mg twice a day.  Take 1 hour around food. Continue on Pepcid Twice daily  .  Robitussin As needed  cough .  Follow up with Dr. Lamonte Sakai  in 3 months and As needed

## 2022-11-24 ENCOUNTER — Other Ambulatory Visit: Payer: Self-pay | Admitting: Cardiovascular Disease

## 2022-11-24 DIAGNOSIS — I10 Essential (primary) hypertension: Secondary | ICD-10-CM

## 2022-12-12 ENCOUNTER — Other Ambulatory Visit: Payer: Self-pay | Admitting: Allergy and Immunology

## 2022-12-25 ENCOUNTER — Other Ambulatory Visit: Payer: Self-pay | Admitting: Cardiovascular Disease

## 2022-12-25 DIAGNOSIS — I10 Essential (primary) hypertension: Secondary | ICD-10-CM

## 2023-01-08 ENCOUNTER — Other Ambulatory Visit: Payer: Self-pay | Admitting: Cardiovascular Disease

## 2023-01-08 DIAGNOSIS — I10 Essential (primary) hypertension: Secondary | ICD-10-CM

## 2023-01-11 NOTE — Telephone Encounter (Signed)
Patient requesting refill on amlodipine. Patient last seen 6/1/2. Please Advise.

## 2023-01-13 NOTE — Telephone Encounter (Signed)
Called pt to schedule return office visit. Left message for pt to call back.

## 2023-02-09 ENCOUNTER — Other Ambulatory Visit: Payer: Self-pay | Admitting: Cardiovascular Disease

## 2023-02-09 DIAGNOSIS — I10 Essential (primary) hypertension: Secondary | ICD-10-CM

## 2023-02-23 ENCOUNTER — Telehealth: Payer: Self-pay | Admitting: Cardiovascular Disease

## 2023-02-23 ENCOUNTER — Other Ambulatory Visit: Payer: Self-pay | Admitting: Cardiovascular Disease

## 2023-02-23 DIAGNOSIS — I10 Essential (primary) hypertension: Secondary | ICD-10-CM

## 2023-02-23 MED ORDER — AMLODIPINE BESYLATE 5 MG PO TABS
5.0000 mg | ORAL_TABLET | Freq: Every day | ORAL | 0 refills | Status: DC
Start: 2023-02-23 — End: 2023-05-04

## 2023-02-23 NOTE — Telephone Encounter (Signed)
*  STAT* If patient is at the pharmacy, call can be transferred to refill team.   1. Which medications need to be refilled? (please list name of each medication and dose if known) amLODipine (NORVASC) 5 MG tablet   2. Which pharmacy/location (including street and city if local pharmacy) is medication to be sent to?[facility] - [place], [place] - [address]   3. Do they need a 30 day or 90 day supply? 90 day

## 2023-03-02 ENCOUNTER — Encounter: Payer: Self-pay | Admitting: Allergy and Immunology

## 2023-04-06 ENCOUNTER — Other Ambulatory Visit: Payer: Self-pay

## 2023-04-06 ENCOUNTER — Ambulatory Visit: Payer: 59 | Admitting: Allergy and Immunology

## 2023-04-06 ENCOUNTER — Encounter: Payer: Self-pay | Admitting: Allergy and Immunology

## 2023-04-06 VITALS — BP 118/88 | HR 68 | Temp 97.8°F | Resp 16 | Ht 65.0 in | Wt 163.5 lb

## 2023-04-06 DIAGNOSIS — J3089 Other allergic rhinitis: Secondary | ICD-10-CM | POA: Diagnosis not present

## 2023-04-06 DIAGNOSIS — J849 Interstitial pulmonary disease, unspecified: Secondary | ICD-10-CM

## 2023-04-06 DIAGNOSIS — J4550 Severe persistent asthma, uncomplicated: Secondary | ICD-10-CM | POA: Diagnosis not present

## 2023-04-06 DIAGNOSIS — D860 Sarcoidosis of lung: Secondary | ICD-10-CM

## 2023-04-06 DIAGNOSIS — K219 Gastro-esophageal reflux disease without esophagitis: Secondary | ICD-10-CM

## 2023-04-06 DIAGNOSIS — B0231 Zoster conjunctivitis: Secondary | ICD-10-CM

## 2023-04-06 DIAGNOSIS — J3489 Other specified disorders of nose and nasal sinuses: Secondary | ICD-10-CM

## 2023-04-06 MED ORDER — FLUTICASONE PROPIONATE 50 MCG/ACT NA SUSP: 1.0000 | Freq: Every day | NASAL | 1 refills | Status: DC

## 2023-04-06 MED ORDER — BREZTRI AEROSPHERE 160-9-4.8 MCG/ACT IN AERO: INHALATION_SPRAY | RESPIRATORY_TRACT | 1 refills | Status: DC

## 2023-04-06 MED ORDER — PANTOPRAZOLE SODIUM 40 MG PO TBEC: 40.0000 mg | DELAYED_RELEASE_TABLET | Freq: Two times a day (BID) | ORAL | 1 refills | Status: DC

## 2023-04-06 NOTE — Patient Instructions (Addendum)
  1.  Continue to perform allergen avoidance measures - house dust mite  2.  Continue to treat inflammation of airway:   A.  Breztri - 2 inhalations 2 times a day  B.  Flonase 1 spray each nostril only 1 time per day.    C.  Tezepelumab injections every 4 weeks   3.  Continue to treat reflux:   A.  Eliminate use of fish oil, caffeine, chocolate  B.  Raise head of bed  C.  No late meals  D.  Pantoprazole 40 mg twice a day  E.  OTC Famotidine 20 mg  two tablets in evening; can discontinue  F.  Metoclopramide 10 mg in evening; can discontinue  5.  If needed:   A.  OTC antihistamine   B.  Albuterol HFA-2 inhalations once a day  6. Return to clinic in 6 months or earlier if problem  7. Plan for fall flu vaccine  8. Treatment for postherpetic neuralgia???

## 2023-04-06 NOTE — Progress Notes (Unsigned)
[place] [facility] [place] - [place] - [place] - [place]   Follow-up Note  Referring Provider: Elie Confer, NP Primary Provider: Elie Confer, NP Date of Office Visit: 04/06/2023  Subjective:   [name] (DOB: 1955/09/13) is a 67 y.o. female who [facility] in re-evaluation of the following:  HPI: Hiliary returns to this clinic in evaluation of severe asthma, sarcoidosis, rhinitis, LPR.  I last saw her in this clinic 29 September 2022.  We started her on tezepelumab injections this spring and she is much improved since that treatment.  Her cough is diminished dramatically and she no longer has any phlegm or sputum production.  She continues to use her Breztri.  She does not use any short acting bronchodilator.  Her nose is doing well while using Flonase.  She has not required a systemic steroid or an antibiotic for any type of airway issue.  Her reflux is doing much better as well.  She developed shingles on her forehead into her right eye and she was treated with 10 days of Valtrex which she finished 5 days ago.  She still has a lot of pain in that area and she feels as though there is a film across her eye.  Allergies as of 04/06/2023   No Known Allergies      Medication List    albuterol (2.5 MG/3ML) 0.083% nebulizer solution Commonly known as: PROVENTIL Take 3 mLs (2.5 mg total) by nebulization every 4 (four) hours as needed for wheezing or shortness of breath.   albuterol 108 (90 Base) MCG/ACT inhaler Commonly known as: VENTOLIN HFA Inhale 2 puffs into the lungs every 4 (four) hours as needed for wheezing or shortness of breath.   amLODipine 5 MG tablet Commonly known as: NORVASC Take 1 tablet (5 mg total) by mouth daily.   aspirin 81 MG tablet Take 81 mg by mouth daily.   aspirin-acetaminophen-caffeine 250-250-65 MG tablet Commonly known as: EXCEDRIN MIGRAINE Take 1 tablet by mouth every 6 (six)  hours as needed for pain or migraine.   atorvastatin 40 MG tablet Commonly known as: LIPITOR Take 1 tablet (40 mg total) by mouth daily.   Breztri Aerosphere 160-9-4.8 MCG/ACT Aero Generic drug: Budeson-Glycopyrrol-Formoterol USE 2 INHALATIONS BY MOUTH TWICE DAILY TO PREVENT COUGH OR  WHEEZE. RINSE MOUTH AFTER USE   famotidine 40 MG tablet Commonly known as: PEPCID Take 1 tablet (40 mg total) by mouth at bedtime.   fenofibrate 160 MG tablet Take 160 mg by mouth daily.   fluticasone 50 MCG/ACT nasal spray Commonly known as: FLONASE PLACE 1 SPRAY INTO BOTH NOSTRILS DAILY.   HYDROcodone bit-homatropine 5-1.5 MG/5ML syrup Commonly known as: HYCODAN Take 5 mLs by mouth every 6 (six) hours as needed for cough.   ibuprofen 200 MG tablet Commonly known as: ADVIL Take 400 mg by mouth every 6 (six) hours as needed for mild pain.   loratadine 10 MG tablet Commonly known as: CLARITIN Take 1 tablet (10 mg total) by mouth daily.   losartan-hydrochlorothiazide 50-12.5 MG tablet Commonly known as: HYZAAR Take 1 tablet by mouth daily.   meloxicam 7.5 MG tablet Commonly known as: MOBIC Take 7.5 mg by mouth daily.   metoCLOPramide 10 MG tablet Commonly known as: REGLAN Take 1 tablet (10 mg total) by mouth in the morning and at bedtime.   multivitamin capsule Take 1 capsule by mouth daily.   pantoprazole 40 MG tablet Commonly known as: PROTONIX TAKE  1 TABLET BY MOUTH TWICE  DAILY   Tezspire 210 MG/1. Soaj Generic drug: Tezepelumab-ekko Inject 210 mg into the skin every 28 (twenty-eight) days.   vitamin C 1000 MG tablet Take 1,000 mg by mouth daily.   Vitamin D (Ergocalciferol) 1.25 MG (50000 UNIT) Caps capsule Commonly known as: DRISDOL Take 50,000 Units by mouth every 7 (seven) days.    Past Medical History:  Diagnosis Date   Anemia    Angio-edema    Arthritis    Asthma 11/06/2022   Cataract    Chronic sinusitis    Cough    Diabetes mellitus without  complication (HCC)    Diverticulosis 2009   mild sigmoid   GERD (gastroesophageal reflux disease)    Glaucoma    High cholesterol    Hx of adenomatous and sessile serrated colonic polyps 09/04/2022   12/23 1 diminutive ssp and 1 diminutive adenoma (and 1 distal hpp) - recall 2030-31   Hypertension    Insomnia    Recurrent upper respiratory infection (URI)    Sarcoidosis    Vitamin D deficiency     Past Surgical History:  Procedure Laterality Date   COLONOSCOPY  2009   LYMPHADENECTOMY     NOSE SURGERY     SINOSCOPY     TUBAL LIGATION     VESICOVAGINAL FISTULA CLOSURE W/ TAH     VIDEO BRONCHOSCOPY Bilateral 07/29/2017   Procedure: VIDEO BRONCHOSCOPY WITH FLUORO;  Surgeon: Leslye Peer, MD;  Location: [facility];  Service: Cardiopulmonary;  Laterality: Bilateral;    Review of systems negative except as noted in HPI / PMHx or noted below:  Review of Systems  Constitutional: Negative.   HENT: Negative.    Eyes: Negative.   Respiratory: Negative.    Cardiovascular: Negative.   Gastrointestinal: Negative.   Genitourinary: Negative.   Musculoskeletal: Negative.   Skin: Negative.   Neurological: Negative.   Endo/Heme/Allergies: Negative.   Psychiatric/Behavioral: Negative.       Objective:   Vitals:   04/06/23 1621  BP: 118/88  Pulse: 68  Resp: 16  Temp: 97.8 F (36.6 C)  SpO2: 97%   Height: 5\' 5"  (165.1 cm)  Weight: 163 lb 8 oz (74.2 kg)   Physical Exam Constitutional:      Appearance: She is not diaphoretic.  HENT:     Head: Normocephalic.     Right Ear: Tympanic membrane, ear canal and external ear normal.     Left Ear: Tympanic membrane, ear canal and external ear normal.     Nose: Nose normal. No mucosal edema or rhinorrhea.     Mouth/Throat:     Pharynx: Uvula midline. No oropharyngeal exudate.  Eyes:     Conjunctiva/sclera: Conjunctivae normal.  Neck:     Thyroid: No thyromegaly.     Trachea: Trachea normal. No tracheal tenderness or  tracheal deviation.  Cardiovascular:     Rate and Rhythm: Normal rate and regular rhythm.     Heart sounds: Normal heart sounds, S1 normal and S2 normal. No murmur heard. Pulmonary:     Effort: No respiratory distress.     Breath sounds: Normal breath sounds. No stridor. No wheezing (CLEAR!!!) or rales.  Lymphadenopathy:     Head:     Right side of head: No tonsillar adenopathy.     Left side of head: No tonsillar adenopathy.     Cervical: No cervical adenopathy.  Skin:    Findings: Rash (Scab right frontal supraorbital nasal bridge.) present. No erythema.  Nails: There is no clubbing.  Neurological:     Mental Status: She is alert.     Diagnostics:    Spirometry was performed and demonstrated an FEV1 of 1.47 at 70 % of predicted.  Her previous FEV1 was 0.96.  Assessment and Plan:   No diagnosis found.   1.  Continue to perform allergen avoidance measures - house dust mite  2.  Continue to treat inflammation of airway:   A.  Breztri - 2 inhalations 2 times a day  B.  Flonase 1 spray each nostril only 1 time per day.    C.  Tezepelumab injections every 4 weeks   3.  Continue to treat reflux:   A.  Eliminate use of fish oil, caffeine, chocolate  B.  Raise head of bed  C.  No late meals  D.  Pantoprazole 40 mg twice a day  E.  OTC Famotidine 20 mg  two tablets in evening; can discontinue  F.  Metoclopramide 10 mg in evening; can discontinue  5.  If needed:   A.  OTC antihistamine   B.  Albuterol HFA-2 inhalations once a day  6. Return to clinic in 6 months or earlier if problem  7. Plan for fall flu vaccine  8. Treatment for postherpetic neuralgia???  Tezepelumab was a necessary medicine for Rhyen and her improvement is quite dramatic while using this anti-TSL P antibody.  This is the best her lungs have been in years with a dramatic diminution in her coughing and elimination of her phlegm production and a doubling of her airflow.  I think during today's  visit we will see if she can lower some of her medications for her reflux and she has the option of discontinuing her famotidine and discontinue her metoclopramide.  She has a rather significant episode of shingles and I told her that if she does not continue to improve, even if that improvement is slow, then she probably does not need any therapy but if she stops improving that she may need treatment for postherpetic neuralgia.  I will see her back in this clinic in 6 months or earlier if there is a problem.  Laurette Schimke, MD Allergy / Immunology [facility]

## 2023-04-07 ENCOUNTER — Encounter: Payer: Self-pay | Admitting: Allergy and Immunology

## 2023-05-01 ENCOUNTER — Other Ambulatory Visit: Payer: Self-pay | Admitting: Allergy and Immunology

## 2023-05-03 ENCOUNTER — Other Ambulatory Visit: Payer: Self-pay | Admitting: Cardiovascular Disease

## 2023-05-03 DIAGNOSIS — I10 Essential (primary) hypertension: Secondary | ICD-10-CM

## 2023-05-04 ENCOUNTER — Telehealth: Payer: Self-pay | Admitting: Adult Health

## 2023-05-04 NOTE — Telephone Encounter (Signed)
Spoke with the pt  She was exposed to her grandson, who had covid 3 days ago  She has no symptoms  Asks for antiviral  I advised she should monitor for symptoms and take test if she begins to have them and to call here if pos  She verbalized understanding  Nothing further needed

## 2023-05-04 NOTE — Telephone Encounter (Signed)
Patient states exposed to Covid 19. Would like to know if she needs to start on medication. Not having any symptoms now. Pharmacy is [facility]. Patient phone number is [phone].

## 2023-05-11 ENCOUNTER — Other Ambulatory Visit: Payer: Self-pay | Admitting: Allergy and Immunology

## 2023-05-13 ENCOUNTER — Telehealth: Payer: Self-pay | Admitting: Emergency Medicine

## 2023-05-13 ENCOUNTER — Telehealth: Payer: Self-pay | Admitting: Adult Health

## 2023-05-13 MED ORDER — PAXLOVID (300/100) 20 X 150 MG & 10 X 100MG PO TBPK: 1.0000 | ORAL_TABLET | Freq: Two times a day (BID) | ORAL | 0 refills | Status: DC

## 2023-05-13 NOTE — Telephone Encounter (Signed)
Paxlovid rx sent.  Patient aware.

## 2023-05-13 NOTE — Telephone Encounter (Signed)
Patient tested positive for Covid and needs Paxlovid sent into her pharmacy [facility] [phone] - [place], [place] - [address] & [facility] [address]

## 2023-05-13 NOTE — Telephone Encounter (Signed)
Calling for Paxlovid and Patient has tested positive for covid the pharmacy has not rcvd prescription  Pharmacy: [facility] on [address]

## 2023-05-13 NOTE — Telephone Encounter (Signed)
Agree with Paxlovid 300/100 mg p.o. twice daily for 5 days.

## 2023-05-13 NOTE — Telephone Encounter (Signed)
Pt stated she tested positive for covid, experiencing fever, coughing and headaches. Requesting Paxlovid, Dr. Delton Coombes please advise

## 2023-05-14 ENCOUNTER — Telehealth: Payer: Self-pay | Admitting: Adult Health

## 2023-05-14 NOTE — Telephone Encounter (Signed)
Paxlovid  [facility] calling. The dosage we submitted yesterday is not how Paxlovid is dosed. Please resubmit correctly or call Pharm for correct dosage.

## 2023-05-14 NOTE — Telephone Encounter (Signed)
Rx for paxlovid was sent in for pt 9/19. Closing encounter.

## 2023-05-14 NOTE — Telephone Encounter (Signed)
Please refer to 05/13/2023 phone note.  Dr. Delton Coombes ordered for Paxlovid 300/100mg  BID x5d.   [name] with [facility] is aware of above message and voiced her understanding.  Nothing further needed.

## 2023-05-19 ENCOUNTER — Encounter: Payer: Self-pay | Admitting: Emergency Medicine

## 2023-06-03 ENCOUNTER — Telehealth: Payer: Self-pay

## 2023-06-03 NOTE — Telephone Encounter (Signed)
Received fax from [facility] - DOB verified - forwarding the following lab test results:  TSH  A1C Lipid Panel CMP CBC w/Diff/Platelet  Lab results have been placed in provider's in basket for review.  Forwarding to provider as update.

## 2023-06-09 ENCOUNTER — Ambulatory Visit: Payer: 59 | Admitting: Cardiovascular Disease

## 2023-06-14 ENCOUNTER — Ambulatory Visit: Payer: 59 | Attending: Cardiovascular Disease | Admitting: Cardiovascular Disease

## 2023-06-14 ENCOUNTER — Encounter: Payer: Self-pay | Admitting: Cardiovascular Disease

## 2023-06-14 VITALS — BP 114/70 | HR 76 | Ht 65.0 in | Wt 156.0 lb

## 2023-06-14 DIAGNOSIS — I10 Essential (primary) hypertension: Secondary | ICD-10-CM | POA: Diagnosis not present

## 2023-06-14 DIAGNOSIS — E782 Mixed hyperlipidemia: Secondary | ICD-10-CM

## 2023-06-14 DIAGNOSIS — I2510 Atherosclerotic heart disease of native coronary artery without angina pectoris: Secondary | ICD-10-CM

## 2023-06-14 MED ORDER — ATORVASTATIN CALCIUM 80 MG PO TABS: 80.0000 mg | ORAL_TABLET | Freq: Every day | ORAL | 3 refills | Status: DC

## 2023-06-14 NOTE — Assessment & Plan Note (Signed)
Coronary calcium score of 1178 performed 07/19/2019.  She denies chest pain or shortness of breath.  She is not at goal for secondary prevention with regard to her lipid profile.  Will adjust her atorvastatin appropriately.

## 2023-06-14 NOTE — Assessment & Plan Note (Signed)
History of essential hypertension with blood pressure measured today at 114/70.  She is on  losartan and hydrochlorothiazide.  Apparently she was on amlodipine but was told to discontinue this.

## 2023-06-14 NOTE — Assessment & Plan Note (Signed)
History of hyperlipidemia on atorvastatin and fenofibrate with lipid profile performed 06/02/2023 revealing a total cholesterol of 182, LDL of 112 and HDL of 47.  This represents a significant increase compared to her previous lipid profile performed 02/16/2020 at which time her LDL was 73.  There is no explanation for this apparently.  She is not at goal for secondary prevention given her significantly elevated coronary calcium score of greater than 1000.  I am going to increase her atorvastatin from 40 to 80 mg a day and we will recheck a lipid liver profile in 3 months.  If she is not at goal on these medications, she may be at candidate for a PCSK9.

## 2023-06-14 NOTE — Patient Instructions (Signed)
Medication Instructions:  Your physician has recommended you make the following change in your medication:   -Increase atorvastatin (lipitor) to 80mg  once daily.  -Restart aspirin 81mg  daily.  *If you need a refill on your cardiac medications before your next appointment, please call your pharmacy*   Lab Work: Your physician recommends that you return for lab work in: 3 months for FASTING lipid/liver panel  If you have labs (blood work) drawn today and your tests are completely normal, you will receive your results only by: MyChart Message (if you have MyChart) OR A paper copy in the mail If you have any lab test that is abnormal or we need to change your treatment, we will call you to review the results.   Follow-Up: At [facility], you and your health needs are our priority.  As part of our continuing mission to provide you with exceptional heart care, we have created designated Provider Care Teams.  These Care Teams include your primary Cardiologist (physician) and Advanced Practice Providers (APPs -  Physician Assistants and Nurse Practitioners) who all work together to provide you with the care you need, when you need it.  We recommend signing up for the patient portal called "MyChart".  Sign up information is provided on this After Visit Summary.  MyChart is used to connect with patients for Virtual Visits (Telemedicine).  Patients are able to view lab/test results, encounter notes, upcoming appointments, etc.  Non-urgent messages can be sent to your provider as well.   To learn more about what you can do with MyChart, go to [email or web address].    Your next appointment:   12 month(s)  Provider:   Nanetta Batty, MD

## 2023-06-14 NOTE — Progress Notes (Signed)
06/14/2023 CELIA HERMAN   24-May-1956  [id number]  Primary Physician Elie Confer, NP Primary Cardiologist: Runell Gess MD Nich[place], [place]  HPI:  [name] is a 67 y.o.    fit appearing divorced African-American female mother of 2, grandmother of 5 grandchildren who works as an Camera operator at [employer]. She is currently working from home. She was referred by Dr. Delton Coombes for cardiovascular dilation because of coronary calcification found incidentally on chest CT performed 04/07/2019.  I last saw her in the office 01/22/2021.  Her risk factors include treated hypertension and hyperlipidemia. She is never smoked. There is no family history of heart disease. She is never had a heart attack or stroke. She denies chest pain or shortness of breath. She does have sarcoidosis which she has had since 1997. Her recent CT scan performed in August was unchanged from the one performed in January. She did have coronary calcification in the LAD and RCA seen on that CT scan however and was referred here for evaluation of this.   Based on her coronary calcification on chest CT I performed coronary calcium score on her 07/19/2019 which was 1178.  Based on this I increased her atorvastatin from 20 to 40 mg a day.  She is completely asymptomatic other than her baseline shortness of breath which she attributes to her sarcoidosis.  She does not smoke.   Since I saw her a year ago she has remained stable.  Dr. Delton Coombes continues to follow her ILD from stage IV sarcoid.  She does have cavitary changes.  She does walk a mile a day and is not limited by dyspnea.  She was seen in the ER last month with dizziness and decree sensation on her left side.  MRI of her brain was unrevealing.  The symptoms persist.    Since I saw her in the office 2 years ago she did recently contract COVID and had a persistent cough.  She was placed on Paxlovid but otherwise denies chest pain or shortness of  breath.  Current Meds  Medication Sig   albuterol (PROVENTIL) (2.5 MG/3ML) 0.083% nebulizer solution Take 3 mLs (2.5 mg total) by nebulization every 4 (four) hours as needed for wheezing or shortness of breath.   albuterol (VENTOLIN HFA) 108 (90 Base) MCG/ACT inhaler Inhale 2 puffs into the lungs every 4 (four) hours as needed for wheezing or shortness of breath.   Ascorbic Acid (VITAMIN C) 1000 MG tablet Take 1,000 mg by mouth daily.   aspirin-acetaminophen-caffeine (EXCEDRIN MIGRAINE) 250-250-65 MG per tablet Take 1 tablet by mouth every 6 (six) hours as needed for pain or migraine.   BREZTRI AEROSPHERE 160-9-4.8 MCG/ACT AERO USE 2 INHALATIONS BY MOUTH TWICE DAILY TO PREVENT COUGH OR  WHEEZE. RINSE MOUTH AFTER USE   fenofibrate 160 MG tablet Take 160 mg by mouth daily.   fluticasone (FLONASE) 50 MCG/ACT nasal spray Place 1 spray into both nostrils daily.   HYDROcodone bit-homatropine (HYCODAN) 5-1.5 MG/5ML syrup Take 5 mLs by mouth every 6 (six) hours as needed for cough.   loratadine (CLARITIN) 10 MG tablet Take 1 tablet (10 mg total) by mouth daily.   losartan-hydrochlorothiazide (HYZAAR) 50-12.5 MG per tablet Take 1 tablet by mouth daily.   Multiple Vitamin (MULTIVITAMIN) capsule Take 1 capsule by mouth daily.   pantoprazole (PROTONIX) 40 MG tablet TAKE 1 TABLET BY MOUTH TWICE  DAILY   TEZSPIRE 210 MG/1. SOAJ Inject 210 mg into the skin  every 28 (twenty-eight) days.   Vitamin D, Ergocalciferol, (DRISDOL) 1.25 MG (50000 UNIT) CAPS capsule Take 50,000 Units by mouth every 7 (seven) days.   [DISCONTINUED] amLODipine (NORVASC) 5 MG tablet TAKE 1 TABLET BY MOUTH DAILY   [DISCONTINUED] ibuprofen (ADVIL,MOTRIN) 200 MG tablet Take 400 mg by mouth every 6 (six) hours as needed for mild pain.     No Known Allergies  Social History   Socioeconomic History   Marital status: Divorced    Spouse name: Not on file   Number of children: 2   Years of education: college   Highest education  level: Not on file  Occupational History   Occupation: CONSULTANT    Employer: [employer]  Tobacco Use   Smoking status: Never   Smokeless tobacco: Never  Vaping Use   Vaping status: Never Used  Substance and Sexual Activity   Alcohol use: No   Drug use: No   Sexual activity: Not Currently    Partners: Male  Other Topics Concern   Not on file  Social History Narrative   Patient lives at home alone.  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  Cardiovascular: negative for chest pain, dyspnea on exertion, edema, orthopnea, palpitations, paroxysmal nocturnal dyspnea or shortness of breath Dermatological: negative for rash Respiratory: negative for cough or wheezing Urologic: negative for hematuria Abdominal: negative for nausea, vomiting, diarrhea, bright red blood per rectum, melena, or hematemesis Neurologic: negative for visual changes, syncope, or dizziness All other systems reviewed and are otherwise negative except as noted above.    Blood pressure 114/70, pulse 76, height 5\' 5"  (1.651 m), weight 156 lb (70.8 kg).  General appearance: alert and no distress Neck: no adenopathy, no carotid bruit, no JVD, supple, symmetrical, trachea midline, and thyroid not enlarged, symmetric, no tenderness/mass/nodules Lungs: clear to auscultation bilaterally Heart: regular rate and rhythm, S1, S2 normal, no murmur, click, rub or gallop Extremities: extremities normal, atraumatic, no cyanosis or  edema Pulses: 2+ and symmetric Skin: Skin color, texture, turgor normal. No rashes or lesions Neurologic: Grossly normal  EKG EKG Interpretation Date/Time:  Monday June 14 2023 15:17:47 EDT Ventricular Rate:  76 PR Interval:  190 QRS Duration:  162 QT Interval:  410 QTC Calculation: 461 R Axis:   -81  Text Interpretation: Normal sinus rhythm Right bundle branch block Left anterior fascicular block Bifascicular block Septal infarct , age undetermined Possible Lateral infarct , age undetermined When compared with ECG of 23-Dec-2020 16:50, PREVIOUS ECG IS PRESENT Confirmed by Nanetta Batty [phone]) on 06/14/2023 3:20:44 PM    ASSESSMENT AND PLAN:   Essential hypertension History of essential hypertension with blood pressure measured today at 114/70.  She is on  losartan and hydrochlorothiazide.  Apparently she was on amlodipine but was told to discontinue this.  Hyperlipidemia History of hyperlipidemia on atorvastatin and fenofibrate with lipid profile performed 06/02/2023 revealing a total cholesterol of 182, LDL of 112 and HDL of 47.  This represents a significant increase compared to her previous lipid profile performed 02/16/2020 at which time her LDL was 73.  There is no explanation for this apparently.  She is not at goal for secondary prevention given her significantly elevated coronary calcium score of greater than 1000.  I am going to increase her atorvastatin from 40 to 80 mg a day and we will recheck a lipid liver profile in 3 months.  If she is not at goal on these medications, she may be at candidate for a PCSK9.  Coronary artery calcification seen on CT scan Coronary calcium score of 1178 performed 07/19/2019.  She denies chest pain or shortness of breath.  She is not at goal for secondary prevention with regard to her lipid profile.  Will adjust her atorvastatin appropriately.     Runell Gess MD FACP,FACC,FAHA, [facility] 06/14/2023 3:36 PM

## 2023-07-05 ENCOUNTER — Encounter: Payer: Self-pay | Admitting: *Deleted

## 2023-07-15 ENCOUNTER — Telehealth: Payer: Self-pay | Admitting: Emergency Medicine

## 2023-07-15 NOTE — Telephone Encounter (Signed)
PT states she should have another CT and follow up appt. I see no order for a CT and PT wants to be seen sooner than the January availability. Can we work in?  Nothing on the last couple of AVS's about a CT but it looks like Dr. Is wanting to monitor and PT is correct about needing CT order.   Had Covid recently and feels a tightness in her chest. [phone]

## 2023-07-20 NOTE — Telephone Encounter (Signed)
Spoke with the pt  She is asking to be worked in with someone  She has been having chest tightness, cough and increased SOB over the past several wks  She was seen at [facility] on 07/05/23 and given doxy for they they told her was sinus infection that turned into bronchitis  She states the fever she had stopped after completing abx, but she still has some chest tightness and cough with clear sputum  She states that she is not wheezing but "hears rattling"  She is using her albuterol and breztri  Unfortunately no openings until mid Jan 2025 with any provider  Beth, please advise, thanks!

## 2023-07-20 NOTE — Telephone Encounter (Signed)
Pt scheduled for acute visit with Florentina Addison tomorrow [facility]

## 2023-07-21 ENCOUNTER — Ambulatory Visit: Payer: 59

## 2023-07-21 ENCOUNTER — Encounter: Payer: Self-pay | Admitting: Nurse Practitioner

## 2023-07-21 ENCOUNTER — Ambulatory Visit: Payer: 59 | Admitting: Nurse Practitioner

## 2023-07-21 ENCOUNTER — Other Ambulatory Visit: Payer: Self-pay | Admitting: Nurse Practitioner

## 2023-07-21 VITALS — BP 110/70 | HR 97 | Ht 65.0 in | Wt 151.8 lb

## 2023-07-21 DIAGNOSIS — J4550 Severe persistent asthma, uncomplicated: Secondary | ICD-10-CM

## 2023-07-21 DIAGNOSIS — D869 Sarcoidosis, unspecified: Secondary | ICD-10-CM

## 2023-07-21 DIAGNOSIS — R918 Other nonspecific abnormal finding of lung field: Secondary | ICD-10-CM

## 2023-07-21 LAB — POCT EXHALED NITRIC OXIDE: FeNO level (ppb): 26

## 2023-07-21 MED ORDER — HYDROCODONE BIT-HOMATROP MBR 5-1.5 MG/5ML PO SOLN
5.0000 mL | Freq: Four times a day (QID) | ORAL | 0 refills | Status: DC | PRN
Start: 2023-07-21 — End: 2023-08-12

## 2023-07-21 MED ORDER — PREDNISONE 10 MG PO TABS: ORAL_TABLET | ORAL | 0 refills | Status: DC

## 2023-07-21 MED ORDER — AZITHROMYCIN 250 MG PO TABS: ORAL_TABLET | ORAL | 0 refills | Status: DC

## 2023-07-21 MED ORDER — ALBUTEROL SULFATE (2.5 MG/3ML) 0.083% IN NEBU
2.5000 mg | INHALATION_SOLUTION | RESPIRATORY_TRACT | 2 refills | Status: DC | PRN
Start: 2023-07-21 — End: 2024-07-12

## 2023-07-21 MED ORDER — BENZONATATE 200 MG PO CAPS: 200.0000 mg | ORAL_CAPSULE | Freq: Three times a day (TID) | ORAL | 1 refills | Status: DC | PRN

## 2023-07-21 NOTE — Progress Notes (Signed)
@Patient  ID: Priscille Kluver, female    DOB: 1956-07-11, 67 y.o.   MRN: [id number]  Chief Complaint  Patient presents with   Acute Visit    Cough 2-3 months with clear/white sputum.    Referring provider: Elie Confer, NP  HPI: 67 year old female, never smoker followed for stage IV sarcoidosis characterized by widespread scarring cavitary disease on CT scan with associated left upper lobe aspergilloma and bronchiectatic changes.  She is a patient Dr. Kavin Leech and last seen in office 11/04/2021 by Clent Ridges, NP.  She is also followed by Dr. Lucie Leather for severe persistent asthma and is on biologic therapy with Tezspire.   TEST/EVENTS:  01/13/2017 PFT FVC 76, FEV1 81, ratio 89, TLC 70, DLCOcor 56 02/2022 eos 700  07/28/2022 HRCT chest: Bilateral upper and lower lobe reticulation with groundglass and associated honeycomb change most pronounced superiorly.  Mildly thick walled left upper lobe cavity with mycetoma, unchanged.  Right upper lobe nodule consolidative change with have resolved.  Some new scattered groundglass opacity in left upper lobe and micronodular disease in the left lower lobe, right middle lobe.  11/05/2022: OV with Parrett, NP.  Says overall health is about doing the same.  Gets short of breath with heavy activities.  Cough is at baseline.  Has a daily cough with worsening congestion at nighttime.  Coughs of mucus but does feel better when she does this.  Denies any fever, discolored mucus, hemoptysis or chest pain.  Followed by asthma and allergy with Dr. Lucie Leather.  Recently started on Tezspire.  Has some joint aches with injections but otherwise tolerating well.  Wants to become more active as weather improves.  No change to current regimen.  Follow-up 3 months.  07/21/2023: Today - acute Discussed the use of AI scribe software for clinical note transcription with the patient, who gave verbal consent to proceed.  History of Present Illness   The patient, with a history of  sarcoidosis and severe asthma, presents with persistent respiratory symptoms following a COVID-19 infection in September and a subsequent illness in early November. She feels like her breathing never got better after COVID in September. She then developed URI symptoms with worsening cough earlier this month. She was seen at [facility] and treated with doxycycline. She does note that the fevers resolved with doxycycline but she still feels unwell. She reports a persistent cough, nasal congestion, and headaches. She's coughing up small, white, creamy mucus "balls". She denies any nasal discharge. The patient also reports frequent chills. She denies hemoptysis, leg swelling, and noticeable wheezing, but describes a rattling sensation in her chest. She also reports increased shortness of breath, which has raised concerns about her overall health status. The patient is currently on Breztri twice daily and Tezspire, with the last injection due in the coming week. She reports minimal use of her albuterol rescue inhaler and has not been using her home nebulizer machine. The patient also mentions a lack of energy.  She also had shingles in July. She has improved rash but still hyperpigmentation and nerve pain to the face.       No Known Allergies  Immunization History  Administered Date(s) Administered   Influenza Split 06/21/2011, 05/23/2012, 05/24/2013   Influenza Whole 05/24/2010   Influenza,inj,Quad PF,6+ Mos 06/07/2015, 06/05/2016, 05/17/2017, 06/02/2018, 06/23/2019, 05/17/2020   Influenza-Unspecified 07/08/2022   Pneumococcal Conjugate-13 06/07/2015   Respiratory Syncytial Virus Vaccine,Recomb Aduvanted(Arexvy) 08/06/2022    Past Medical History:  Diagnosis Date   Anemia  Angio-edema    Arthritis    Asthma 11/06/2022   Cataract    Chronic sinusitis    Cough    Diabetes mellitus without complication (HCC)    Diverticulosis 2009   mild sigmoid   GERD (gastroesophageal reflux disease)    Glaucoma     High cholesterol    Hx of adenomatous and sessile serrated colonic polyps 09/04/2022   12/23 1 diminutive ssp and 1 diminutive adenoma (and 1 distal hpp) - recall 2030-31   Hypertension    Insomnia    Recurrent upper respiratory infection (URI)    Sarcoidosis    Vitamin D deficiency     Tobacco History: Social History   Tobacco Use  Smoking Status Never  Smokeless Tobacco Never   Counseling given: Not Answered   Outpatient Medications Prior to Visit  Medication Sig Dispense Refill   albuterol (VENTOLIN HFA) 108 (90 Base) MCG/ACT inhaler Inhale 2 puffs into the lungs every 4 (four) hours as needed for wheezing or shortness of breath. 18 g 1   Ascorbic Acid (VITAMIN C) 1000 MG tablet Take 1,000 mg by mouth daily.     aspirin 81 MG tablet Take 81 mg by mouth daily.     aspirin-acetaminophen-caffeine (EXCEDRIN MIGRAINE) 250-250-65 MG per tablet Take 1 tablet by mouth every 6 (six) hours as needed for pain or migraine.     atorvastatin (LIPITOR) 80 MG tablet Take 1 tablet (80 mg total) by mouth daily. 90 tablet 3   BREZTRI AEROSPHERE 160-9-4.8 MCG/ACT AERO USE 2 INHALATIONS BY MOUTH TWICE DAILY TO PREVENT COUGH OR  WHEEZE. RINSE MOUTH AFTER USE 32.1 g 3   fenofibrate 160 MG tablet Take 160 mg by mouth daily.     fluticasone (FLONASE) 50 MCG/ACT nasal spray Place 1 spray into both nostrils daily. 48 g 1   loratadine (CLARITIN) 10 MG tablet Take 1 tablet (10 mg total) by mouth daily. 90 tablet 0   losartan-hydrochlorothiazide (HYZAAR) 50-12.5 MG per tablet Take 1 tablet by mouth daily.     Multiple Vitamin (MULTIVITAMIN) capsule Take 1 capsule by mouth daily.     pantoprazole (PROTONIX) 40 MG tablet TAKE 1 TABLET BY MOUTH TWICE  DAILY 180 tablet 3   TEZSPIRE 210 MG/1. SOAJ Inject 210 mg into the skin every 28 (twenty-eight) days.     Vitamin D, Ergocalciferol, (DRISDOL) 1.25 MG (50000 UNIT) CAPS capsule Take 50,000 Units by mouth every 7 (seven) days.     albuterol (PROVENTIL)  (2.5 MG/3ML) 0.083% nebulizer solution Take 3 mLs (2.5 mg total) by nebulization every 4 (four) hours as needed for wheezing or shortness of breath. 150 mL 2   HYDROcodone bit-homatropine (HYCODAN) 5-1.5 MG/5ML syrup Take 5 mLs by mouth every 6 (six) hours as needed for cough. 480 mL 0   No facility-administered medications prior to visit.     Review of Systems:   Constitutional: No weight loss or gain, night sweats, fevers +chills, fatigue, lassitude. HEENT: No headaches, difficulty swallowing, tooth/dental problems, or sore throat. No sneezing, itching, ear ache + nasal congestion, post nasal drip CV:  No chest pain, orthopnea, PND, swelling in lower extremities, anasarca, dizziness, palpitations, syncope Resp: +shortness of breath with exertion; productive cough. No hemoptysis. No wheezing.  No chest wall deformity GI:  No heartburn, indigestion, abdominal pain, nausea, vomiting, diarrhea, change in bowel habits, loss of appetite, bloody stools.  GU: No dysuria, change in color of urine, urgency or frequency.  No flank pain, no hematuria  Skin: No  rash, lesions, ulcerations MSK:  No joint pain or swelling.  No decreased range of motion.  No back pain. Neuro: No dizziness or lightheadedness. +nerve pain right forehead Psych: No depression or anxiety. Mood stable.     Physical Exam:  BP 110/70 (BP Location: Right Arm, Cuff Size: Normal)   Pulse 97   Ht 5\' 5"  (1.651 m)   Wt 151 lb 12.8 oz (68.9 kg)   SpO2 94%   BMI 25.26 kg/m   GEN: Pleasant, interactive, well-kempt; acutely ill appearing; non-toxic and in no acute distress HEENT:  Normocephalic and atraumatic. EACs patent bilaterally. TM pearly gray with present light reflex bilaterally. PERRLA. Sclera white. Nasal turbinates erythematous, moist and patent bilaterally. Clear rhinorrhea present. Oropharynx pink and moist, without exudate or edema. No lesions, ulcerations NECK:  Supple w/ fair ROM. No JVD present. Normal carotid  impulses w/o bruits. Thyroid symmetrical with no goiter or nodules palpated. No lymphadenopathy.   CV: RRR, no m/r/g, no peripheral edema. Pulses intact, +2 bilaterally. No cyanosis, pallor or clubbing. PULMONARY:  Unlabored, regular breathing. Scattered expiratory wheezes and rhonchi R>L. No accessory muscle use.  GI: BS present and normoactive. Soft, non-tender to palpation. No organomegaly or masses detected.  MSK: No erythema, warmth or tenderness. Cap refil <2 sec all extrem. No deformities or joint swelling noted.  Neuro: A/Ox3. No focal deficits noted.   Skin: Warm, no lesions or rashe Psych: Normal affect and behavior. Judgement and thought content appropriate.     Lab Results:  CBC    Component Value Date/Time   WBC 6.0 03/17/2022 1713   WBC 8.3 05/22/2021 1435   RBC 4.42 03/17/2022 1713   RBC 4.37 05/22/2021 1435   HGB 12.2 03/17/2022 1713   HCT 36.5 03/17/2022 1713   PLT 242.0 05/22/2021 1435   MCV 83 03/17/2022 1713   MCH 27.6 03/17/2022 1713   MCH 27.5 12/23/2020 1800   MCHC 33.4 03/17/2022 1713   MCHC 32.7 05/22/2021 1435   RDW 13.4 03/17/2022 1713   LYMPHSABS 1.8 03/17/2022 1713   MONOABS 0.8 05/22/2021 1435   EOSABS 0.7 (H) 03/17/2022 1713   BASOSABS 0.1 03/17/2022 1713    BMET    Component Value Date/Time   NA 140 12/23/2020 1800   K 3.6 12/23/2020 1800   CL 102 12/23/2020 1800   CO2 29 12/23/2020 1800   GLUCOSE 89 12/23/2020 1800   BUN 24 (H) 12/23/2020 1800   CREATININE 0.86 12/23/2020 1800   CALCIUM 10.0 12/23/2020 1800   GFRNONAA >60 12/23/2020 1800   GFRAA >60 08/07/2016 1021    BNP No results found for: "BNP"   Imaging:  DG Chest 2 View  Result Date: 07/21/2023 CLINICAL DATA:  Cough, shortness of breath. EXAM: CHEST - 2 VIEW COMPARISON:  September 07, 2016.  July 28, 2022. FINDINGS: Stable cardiomediastinal silhouette. Stable cavitary abnormality seen in medial portion of left lung apex with probable mycetoma. Stable reticular  densities are noted in both lung bases consistent with sarcoidosis and fibrosis. Stable bullous disease is noted in right upper lobe. IMPRESSION: Bilateral lung opacities are noted which correspond to chronic findings noted on prior CT scan. Electronically Signed   By: Lupita Raider M.D.   On: 07/21/2023 14:19    Administration History     None          Latest Ref Rng & Units 01/13/2017    3:04 PM  PFT Results  FVC-Pre L 2.20   FVC-Predicted Pre % 76  FVC-Post L 2.16   FVC-Predicted Post % 75   Pre FEV1/FVC % % 83   Post FEV1/FCV % % 89   FEV1-Pre L 1.83   FEV1-Predicted Pre % 81   FEV1-Post L 1.91   DLCO uncorrected ml/min/mmHg 15.18   DLCO UNC% % 57   DLCO corrected ml/min/mmHg 14.74   DLCO COR %Predicted % 56   DLVA Predicted % 84   TLC L 3.70   TLC % Predicted % 70   RV % Predicted % 66     No results found for: "NITRICOXIDE"      Assessment & Plan:   Asthma Severe asthma on aggressive maintenance regimen with unresolved acute bronchitis. Mildly elevated exhaled nitric oxide testing today and bronchospasm on exam. We will treat her with depo 80 mg inj x1, prednisone taper and cough control measures. Obtain CXR to rule out superimposed infection/worsening chronic lung disease. Optimize bronchodilator regimen. Close follow up. Strict return/ED precautions.   Patient Instructions  Continue Albuterol inhaler 2 puffs or 3 mL neb every 6 hours as needed for shortness of breath or wheezing. Notify if symptoms persist despite rescue inhaler/neb use. Use nebs three times a day until symptoms improve  Continue Breztri 2 puffs Twice daily. Brush tongue and rinse mouth afterwards Continue flonase nasal spray 2 sprays each nostril daily Continue claritin 1 tab daily Continue Tezspire every 28 days as prescribed  Continue pantoprazole 1 tab Twice daily   -Prednisone taper. 4 tabs for 3 days, then 3 tabs for 3 days, 2 tabs for 3 days, then 1 tab for 3 days, then stop. Take  in AM with food. Start tomorrow -Hycodan 5 mL every 6 hours as needed for severe coughing. Do not drive after taking. May cause drowsiness. Do not take with other sedating medications -Benzonatate 1 capsule Three times a day for cough   Chest x ray today  Follow up in 2 weeks with Dr. Delton Coombes or Philis Nettle. Ok to double book KC, if needed on a day not already double booked. If symptoms do not improve or worsen, please contact office for sooner follow up or seek emergency care.    Sarcoidosis Possible sarcoid flare. Previously with stable disease. CXR today. If stable and no obvious signs of infection, she has asked for her yearly CT chest to be completed December 2024 as she has met her deductible. Advised that if plain films show acute process, we will need to wait 6-8 weeks after treatment. Otherwise, I believe we should be able to schedule this for her before years end.    Advised if symptoms do not improve or worsen, to please contact office for sooner follow up or seek emergency care.   I spent 42 minutes of dedicated to the care of this patient on the date of this encounter to include pre-visit review of records, face-to-face time with the patient discussing conditions above, post visit ordering of testing, clinical documentation with the electronic health record, making appropriate referrals as documented, and communicating necessary findings to members of the patients care team.  Noemi Chapel, NP 07/21/2023  Pt aware and understands NP's role.

## 2023-07-21 NOTE — Patient Instructions (Addendum)
Continue Albuterol inhaler 2 puffs or 3 mL neb every 6 hours as needed for shortness of breath or wheezing. Notify if symptoms persist despite rescue inhaler/neb use. Use nebs three times a day until symptoms improve  Continue Breztri 2 puffs Twice daily. Brush tongue and rinse mouth afterwards Continue flonase nasal spray 2 sprays each nostril daily Continue claritin 1 tab daily Continue Tezspire every 28 days as prescribed  Continue pantoprazole 1 tab Twice daily   -Prednisone taper. 4 tabs for 3 days, then 3 tabs for 3 days, 2 tabs for 3 days, then 1 tab for 3 days, then stop. Take in AM with food. Start tomorrow -Hycodan 5 mL every 6 hours as needed for severe coughing. Do not drive after taking. May cause drowsiness. Do not take with other sedating medications -Benzonatate 1 capsule Three times a day for cough   Chest x ray today  Follow up in 2 weeks with Dr. Delton Coombes or Philis Nettle. Ok to double book KC, if needed on a day not already double booked. If symptoms do not improve or worsen, please contact office for sooner follow up or seek emergency care.

## 2023-07-21 NOTE — Assessment & Plan Note (Signed)
Possible sarcoid flare. Previously with stable disease. CXR today. If stable and no obvious signs of infection, she has asked for her yearly CT chest to be completed December 2024 as she has met her deductible. Advised that if plain films show acute process, we will need to wait 6-8 weeks after treatment. Otherwise, I believe we should be able to schedule this for her before years end.

## 2023-07-21 NOTE — Assessment & Plan Note (Signed)
Severe asthma on aggressive maintenance regimen with unresolved acute bronchitis. Mildly elevated exhaled nitric oxide testing today and bronchospasm on exam. We will treat her with depo 80 mg inj x1, prednisone taper and cough control measures. Obtain CXR to rule out superimposed infection/worsening chronic lung disease. Optimize bronchodilator regimen. Close follow up. Strict return/ED precautions.   Patient Instructions  Continue Albuterol inhaler 2 puffs or 3 mL neb every 6 hours as needed for shortness of breath or wheezing. Notify if symptoms persist despite rescue inhaler/neb use. Use nebs three times a day until symptoms improve  Continue Breztri 2 puffs Twice daily. Brush tongue and rinse mouth afterwards Continue flonase nasal spray 2 sprays each nostril daily Continue claritin 1 tab daily Continue Tezspire every 28 days as prescribed  Continue pantoprazole 1 tab Twice daily   -Prednisone taper. 4 tabs for 3 days, then 3 tabs for 3 days, 2 tabs for 3 days, then 1 tab for 3 days, then stop. Take in AM with food. Start tomorrow -Hycodan 5 mL every 6 hours as needed for severe coughing. Do not drive after taking. May cause drowsiness. Do not take with other sedating medications -Benzonatate 1 capsule Three times a day for cough   Chest x ray today  Follow up in 2 weeks with Dr. Delton Coombes or Philis Nettle. Ok to double book KC, if needed on a day not already double booked. If symptoms do not improve or worsen, please contact office for sooner follow up or seek emergency care.

## 2023-07-23 NOTE — Telephone Encounter (Signed)
 Please see mychart sent by pt and advise.

## 2023-08-05 ENCOUNTER — Ambulatory Visit
Admission: RE | Admit: 2023-08-05 | Discharge: 2023-08-05 | Disposition: A | Discharge status: Home / Self Care | Payer: 59 | Source: Ambulatory Visit | Attending: Nurse Practitioner | Admitting: Nurse Practitioner

## 2023-08-05 DIAGNOSIS — D869 Sarcoidosis, unspecified: Secondary | ICD-10-CM

## 2023-08-05 DIAGNOSIS — R918 Other nonspecific abnormal finding of lung field: Secondary | ICD-10-CM

## 2023-08-12 ENCOUNTER — Encounter: Payer: Self-pay | Admitting: Nurse Practitioner

## 2023-08-12 ENCOUNTER — Ambulatory Visit: Payer: 59 | Admitting: Nurse Practitioner

## 2023-08-12 VITALS — BP 116/78 | HR 80 | Ht 65.0 in | Wt 153.0 lb

## 2023-08-12 DIAGNOSIS — Z23 Encounter for immunization: Secondary | ICD-10-CM | POA: Diagnosis not present

## 2023-08-12 DIAGNOSIS — D869 Sarcoidosis, unspecified: Secondary | ICD-10-CM

## 2023-08-12 DIAGNOSIS — J4550 Severe persistent asthma, uncomplicated: Secondary | ICD-10-CM | POA: Diagnosis not present

## 2023-08-12 MED ORDER — METHYLPREDNISOLONE ACETATE 80 MG/ML IJ SUSP: 80.0000 mg | Freq: Once | INTRAMUSCULAR | Status: DC

## 2023-08-12 MED ORDER — PREDNISONE 10 MG PO TABS: ORAL_TABLET | ORAL | 0 refills | Status: DC

## 2023-08-12 MED ORDER — HYDROCODONE BIT-HOMATROP MBR 5-1.5 MG/5ML PO SOLN: 5.0000 mL | Freq: Four times a day (QID) | ORAL | 0 refills | Status: DC | PRN

## 2023-08-12 MED ORDER — BUDESONIDE 0.5 MG/2ML IN SUSP: 0.5000 mg | Freq: Two times a day (BID) | RESPIRATORY_TRACT | 12 refills | Status: AC

## 2023-08-12 NOTE — Patient Instructions (Addendum)
Continue Albuterol inhaler 2 puffs or 3 mL neb every 6 hours as needed for shortness of breath or wheezing. Notify if symptoms persist despite rescue inhaler/neb use. Use nebs three times a day until symptoms improve  Continue Breztri 2 puffs Twice daily. Brush tongue and rinse mouth afterwards Continue flonase nasal spray 2 sprays each nostril daily Continue claritin 1 tab daily Continue Tezspire every 28 days as prescribed  Continue pantoprazole 1 tab Twice daily    -Prednisone taper. 4 tabs for 5 days, then 3 tabs for 5 days, 2 tabs for 5 days, then 1 tab for 5 days, then stop. Take in AM with food. Start tomorrow -Continue Hycodan 5 mL every 6 hours as needed for severe coughing. Do not drive after taking. May cause drowsiness. Do not take with other sedating medications -Benzonatate 1 capsule Three times a day for cough. Use consistently over the next week or so  -Budesonide 2 mL neb Twice daily until symptoms improve then as needed for shortness of breath/wheezing/cough    Flu shot today  I will call you with your CT results    Follow up in 4-6 weeks with Dr. Delton Coombes (1st) or Katie Bryona Foxworthy,NP. If symptoms do not improve or worsen, please contact office for sooner follow up or seek emergency care.

## 2023-08-12 NOTE — Progress Notes (Signed)
@Patient  ID: [name], female    DOB: 12-29-1955, 67 y.o.   MRN: [id number]  Chief Complaint  Patient presents with   Follow-up    Severe persistent asthma, unspecified whether complicated F/U visit. Pt states she is better, but still has cough and request refill on cough syrup. Pt had CT 08-05-23     Referring provider: Elie Confer, Leach  HPI: 67 year old female, never smoker followed for stage IV sarcoidosis characterized by widespread scarring cavitary disease on CT scan with associated left upper lobe aspergilloma and bronchiectatic changes.  She is a patient Dr. Kavin Leach and last seen in office 07/21/2023 by [name].  She is also followed by Dr. Lucie Leach for severe persistent asthma and is on biologic therapy with Tezspire.   TEST/EVENTS:  01/13/2017 PFT FVC 76, FEV1 81, ratio 89, TLC 70, DLCOcor 56 02/2022 eos 700  07/28/2022 HRCT chest: Bilateral upper and lower lobe reticulation with groundglass and associated honeycomb change most pronounced superiorly.  Mildly thick walled left upper lobe cavity with mycetoma, unchanged.  Right upper lobe nodule consolidative change with have resolved.  Some new scattered groundglass opacity in left upper lobe and micronodular disease in the left lower lobe, right middle lobe. 07/21/2023 CXR: bilateral lung opacities, chronic. Stable bullous disease in RUL. No acute process.   11/05/2022: OV with [name].  Says overall health is about doing the same.  Gets short of breath with heavy activities.  Cough is at baseline.  Has a daily cough with worsening congestion at nighttime.  Coughs of mucus but does feel better when she does this.  Denies any fever, discolored mucus, hemoptysis or chest pain.  Followed by asthma and allergy with Dr. Lucie Leach.  Recently started on Tezspire.  Has some joint aches with injections but otherwise tolerating well.  Wants to become more active as weather improves.  No change to current regimen.  Follow-up 3  months.  07/21/2023: [name] with [name] The patient, with a history of sarcoidosis and severe asthma, presents with persistent respiratory symptoms following a COVID-19 infection in September and a subsequent illness in early November. She feels like her breathing never got better after COVID in September. She then developed URI symptoms with worsening cough earlier this month. She was seen at [name] and treated with doxycycline. She does note that the fevers resolved with doxycycline but she still feels unwell. She reports a persistent cough, nasal congestion, and headaches. She's coughing up small, white, creamy mucus "balls". She denies any nasal discharge. The patient also reports frequent chills. She denies hemoptysis, leg swelling, and noticeable wheezing, but describes a rattling sensation in her chest. She also reports increased shortness of breath, which has raised concerns about her overall health status. The patient is currently on Breztri twice daily and Tezspire, with the last injection due in the coming week. She reports minimal use of her albuterol rescue inhaler and has not been using her home nebulizer machine. The patient also mentions a lack of energy.  She also had shingles in July. She has improved rash but still hyperpigmentation and nerve pain to the face.     08/12/2023: Today - follow up Patient presents today for follow-up.  We treated her for an acute asthma exacerbation/possible sarcoid flare at her last visit with prednisone taper, Z-Pak and cough control measures.  She had been having difficulties following COVID illness in September and then another viral illness in early November.  Never seem to be  able to shake her cough and was still having some intermittent fevers and chills.  She has completed all of her medications.  She tells me that she is feeling much better.  Cough has started to creep back and since she finished the steroids.  Not producing much phlegm but when she does it  is clear.  Breathing feels like its mostly back to her normal.  She has not had any more fevers, chills.  No issues with hemoptysis, leg swelling, wheezing.  She did have a recent CT scan but final results are not back on this yet.  She is using her Breztri twice daily.  She does her test by her injections every 28 days.  Last one was the beginning of this month.  Minimal use of albuterol.  Energy level seem to be somewhat better as well.  No Known Allergies  Immunization History  Administered Date(s) Administered   Fluad Trivalent(High Dose 65+) 08/12/2023   Influenza Split 06/21/2011, 05/23/2012, 05/24/2013   Influenza Whole 05/24/2010   Influenza,inj,Quad PF,6+ Mos 06/07/2015, 06/05/2016, 05/17/2017, 06/02/2018, 06/23/2019, 05/17/2020   Influenza-Unspecified 07/08/2022   Pneumococcal Conjugate-13 06/07/2015   Respiratory Syncytial Virus Vaccine,Recomb Aduvanted(Arexvy) 08/06/2022    Past Medical History:  Diagnosis Date   Anemia    Angio-edema    Arthritis    Asthma 11/06/2022   Cataract    Chronic sinusitis    Cough    Diabetes mellitus without complication (HCC)    Diverticulosis 2009   mild sigmoid   GERD (gastroesophageal reflux disease)    Glaucoma    High cholesterol    Hx of adenomatous and sessile serrated colonic polyps 09/04/2022   12/23 1 diminutive ssp and 1 diminutive adenoma (and 1 distal hpp) - recall 2030-31   Hypertension    Insomnia    Recurrent upper respiratory infection (URI)    Sarcoidosis    Vitamin D deficiency     Tobacco History: Social History   Tobacco Use  Smoking Status Never  Smokeless Tobacco Never   Counseling given: Not Answered   Outpatient Medications Prior to Visit  Medication Sig Dispense Refill   albuterol (PROVENTIL) (2.5 MG/3ML) 0.083% nebulizer solution Take 3 mLs (2.5 mg total) by nebulization every 4 (four) hours as needed for wheezing or shortness of breath. 150 mL 2   albuterol (VENTOLIN HFA) 108 (90 Base) MCG/ACT  inhaler Inhale 2 puffs into the lungs every 4 (four) hours as needed for wheezing or shortness of breath. 18 g 1   Ascorbic Acid (VITAMIN C) 1000 MG tablet Take 1,000 mg by mouth daily.     aspirin 81 MG tablet Take 81 mg by mouth daily.     aspirin-acetaminophen-caffeine (EXCEDRIN MIGRAINE) 250-250-65 MG per tablet Take 1 tablet by mouth every 6 (six) hours as needed for pain or migraine.     atorvastatin (LIPITOR) 80 MG tablet Take 1 tablet (80 mg total) by mouth daily. 90 tablet 3   benzonatate (TESSALON) 200 MG capsule Take 1 capsule (200 mg total) by mouth 3 (three) times daily as needed for cough. 30 capsule 1   BREZTRI AEROSPHERE 160-9-4.8 MCG/ACT AERO USE 2 INHALATIONS BY MOUTH TWICE DAILY TO PREVENT COUGH OR  WHEEZE. RINSE MOUTH AFTER USE 32.1 g 3   fenofibrate 160 MG tablet Take 160 mg by mouth daily.     fluticasone (FLONASE) 50 MCG/ACT nasal spray Place 1 spray into both nostrils daily. 48 g 1   loratadine (CLARITIN) 10 MG tablet Take 1 tablet (10  mg total) by mouth daily. 90 tablet 0   losartan-hydrochlorothiazide (HYZAAR) 50-12.5 MG per tablet Take 1 tablet by mouth daily.     Multiple Vitamin (MULTIVITAMIN) capsule Take 1 capsule by mouth daily.     pantoprazole (PROTONIX) 40 MG tablet TAKE 1 TABLET BY MOUTH TWICE  DAILY 180 tablet 3   TEZSPIRE 210 MG/1. SOAJ Inject 210 mg into the skin every 28 (twenty-eight) days.     Vitamin D, Ergocalciferol, (DRISDOL) 1.25 MG (50000 UNIT) CAPS capsule Take 50,000 Units by mouth every 7 (seven) days.     azithromycin (ZITHROMAX) 250 MG tablet Take 2 tabs on day one then 1 tab daily for four additional days 6 tablet 0   HYDROcodone bit-homatropine (HYCODAN) 5-1.5 MG/5ML syrup Take 5 mLs by mouth every 6 (six) hours as needed for cough. 240 mL 0   predniSONE (DELTASONE) 10 MG tablet 4 tabs for 3 days, then 3 tabs for 3 days, 2 tabs for 3 days, then 1 tab for 3 days, then stop 30 tablet 0   No facility-administered medications prior to visit.      Review of Systems:   Constitutional: No weight loss or gain, night sweats, fevers, chills, fatigue, lassitude. HEENT: No headaches, difficulty swallowing, tooth/dental problems, or sore throat. No sneezing, itching, ear ache, nasal congestion, post nasal drip CV:  No chest pain, orthopnea, PND, swelling in lower extremities, anasarca, dizziness, palpitations, syncope Resp: +shortness of breath with exertion (improved); productive cough (improved). No hemoptysis. No wheezing.  No chest wall deformity GI:  No heartburn, indigestion, abdominal pain, nausea, vomiting, diarrhea, change in bowel habits, loss of appetite, bloody stools.  GU: No dysuria, change in color of urine, urgency or frequency.  No flank pain, no hematuria  Skin: No rash, lesions, ulcerations MSK:  No joint pain or swelling.  No decreased range of motion.  No back pain. Neuro: No dizziness or lightheadedness. +nerve pain right forehead Psych: No depression or anxiety. Mood stable.     Physical Exam:  BP 116/78 (BP Location: Right Arm, Patient Position: Sitting, Cuff Size: Normal)   Pulse 80   Ht 5\' 5"  (1.651 m)   Wt 153 lb (69.4 kg)   SpO2 97%   BMI 25.46 kg/m   GEN: Pleasant, interactive, well-kempt; acutely ill appearing; non-toxic and in no acute distress HEENT:  Normocephalic and atraumatic. EACs patent bilaterally. TM pearly gray with present light reflex bilaterally. PERRLA. Sclera white. Nasal turbinates erythematous, moist and patent bilaterally. Clear rhinorrhea present. Oropharynx pink and moist, without exudate or edema. No lesions, ulcerations NECK:  Supple w/ fair ROM. No JVD present. Normal carotid impulses w/o bruits. Thyroid symmetrical with no goiter or nodules palpated. No lymphadenopathy.   CV: RRR, no m/r/g, no peripheral edema. Pulses intact, +2 bilaterally. No cyanosis, pallor or clubbing. PULMONARY:  Unlabored, regular breathing. Minimal scattered rhonchi b/l bases otherwise clear A&P. No  accessory muscle use.  GI: BS present and normoactive. Soft, non-tender to palpation. No organomegaly or masses detected.  MSK: No erythema, warmth or tenderness. Cap refil <2 sec all extrem. No deformities or joint swelling noted.  Neuro: A/Ox3. No focal deficits noted.   Skin: Warm, no lesions or rashe Psych: Normal affect and behavior. Judgement and thought content appropriate.     Lab Results:  CBC    Component Value Date/Time   WBC 6.0 03/17/2022 1713   WBC 8.3 05/22/2021 1435   RBC 4.42 03/17/2022 1713   RBC 4.37 05/22/2021 1435   HGB  12.2 03/17/2022 1713   HCT 36.5 03/17/2022 1713   PLT 242.0 05/22/2021 1435   MCV 83 03/17/2022 1713   MCH 27.6 03/17/2022 1713   MCH 27.5 12/23/2020 1800   MCHC 33.4 03/17/2022 1713   MCHC 32.7 05/22/2021 1435   RDW 13.4 03/17/2022 1713   LYMPHSABS 1.8 03/17/2022 1713   MONOABS 0.8 05/22/2021 1435   EOSABS 0.7 (H) 03/17/2022 1713   BASOSABS 0.1 03/17/2022 1713    BMET    Component Value Date/Time   NA 140 12/23/2020 1800   K 3.6 12/23/2020 1800   CL 102 12/23/2020 1800   CO2 29 12/23/2020 1800   GLUCOSE 89 12/23/2020 1800   BUN 24 (H) 12/23/2020 1800   CREATININE 0.86 12/23/2020 1800   CALCIUM 10.0 12/23/2020 1800   GFRNONAA >60 12/23/2020 1800   GFRAA >60 08/07/2016 1021    BNP No results found for: "BNP"   Imaging:  DG Chest 2 View Result Date: 07/21/2023 CLINICAL DATA:  Cough, shortness of breath. EXAM: CHEST - 2 VIEW COMPARISON:  September 07, 2016.  July 28, 2022. FINDINGS: Stable cardiomediastinal silhouette. Stable cavitary abnormality seen in medial portion of left lung apex with probable mycetoma. Stable reticular densities are noted in both lung bases consistent with sarcoidosis and fibrosis. Stable bullous disease is noted in right upper lobe. IMPRESSION: Bilateral lung opacities are noted which correspond to chronic findings noted on prior CT scan. Electronically Signed   By: Lupita Raider M.D.   On:  07/21/2023 14:19    Administration History     None          Latest Ref Rng & Units 01/13/2017    3:04 PM  PFT Results  FVC-Pre L 2.20   FVC-Predicted Pre % 76   FVC-Post L 2.16   FVC-Predicted Post % 75   Pre FEV1/FVC % % 83   Post FEV1/FCV % % 89   FEV1-Pre L 1.83   FEV1-Predicted Pre % 81   FEV1-Post L 1.91   DLCO uncorrected ml/min/mmHg 15.18   DLCO UNC% % 57   DLCO corrected ml/min/mmHg 14.74   DLCO COR %Predicted % 56   DLVA Predicted % 84   TLC L 3.70   TLC % Predicted % 70   RV % Predicted % 66     No results found for: "NITRICOXIDE"      Assessment & Plan:   Asthma Severe asthmatic on aggressive maintenance regimen. Recent exacerbation following back to back viral illness. Clinically improved; however, cough returning with cessation of steroids.  Will have her restart prolonged steroid taper and continue cough control measures.  Will also add on ICS nebs with budesonide to hopefully prevent recurrence and limit oral steroid use.  Side effect profile reviewed.  Educated on proper use.  Action plan in place.  Strict return precautions.  Patient Instructions  Continue Albuterol inhaler 2 puffs or 3 mL neb every 6 hours as needed for shortness of breath or wheezing. Notify if symptoms persist despite rescue inhaler/neb use. Use nebs three times a day until symptoms improve  Continue Breztri 2 puffs Twice daily. Brush tongue and rinse mouth afterwards Continue flonase nasal spray 2 sprays each nostril daily Continue claritin 1 tab daily Continue Tezspire every 28 days as prescribed  Continue pantoprazole 1 tab Twice daily    -Prednisone taper. 4 tabs for 5 days, then 3 tabs for 5 days, 2 tabs for 5 days, then 1 tab for 5 days, then stop. Take  in AM with food. Start tomorrow -Continue Hycodan 5 mL every 6 hours as needed for severe coughing. Do not drive after taking. May cause drowsiness. Do not take with other sedating medications -Benzonatate 1 capsule Three  times a day for cough. Use consistently over the next week or so  -Budesonide 2 mL neb Twice daily until symptoms improve then as needed for shortness of breath/wheezing/cough    Flu shot today  I will call you with your CT results    Follow up in 4-6 weeks with Dr. Delton Coombes (1st) or Katie Jhoanna Heyde,Leach. If symptoms do not improve or worsen, please contact office for sooner follow up or seek emergency care.    Sarcoidosis Stage IV sarcoidosis with cavitary disease, associated left upper lobe aspergilloma and bronchiectatic changes.  Appears stable on recent imaging.  Recent exacerbation.  See above.  Awaiting CT chest scan results.  Will continue to monitor.    Advised if symptoms do not improve or worsen, to please contact office for sooner follow up or seek emergency care.   I spent 35 minutes of dedicated to the care of this patient on the date of this encounter to include pre-visit review of records, face-to-face time with the patient discussing conditions above, post visit ordering of testing, clinical documentation with the electronic health record, making appropriate referrals as documented, and communicating necessary findings to members of the patients care team.  Noemi Chapel, Leach 08/13/2023  Pt aware and understands Leach's role.

## 2023-08-13 ENCOUNTER — Encounter: Payer: Self-pay | Admitting: Nurse Practitioner

## 2023-08-13 NOTE — Assessment & Plan Note (Signed)
Stage IV sarcoidosis with cavitary disease, associated left upper lobe aspergilloma and bronchiectatic changes.  Appears stable on recent imaging.  Recent exacerbation.  See above.  Awaiting CT chest scan results.  Will continue to monitor.

## 2023-08-13 NOTE — Assessment & Plan Note (Signed)
Severe asthmatic on aggressive maintenance regimen. Recent exacerbation following back to back viral illness. Clinically improved; however, cough returning with cessation of steroids.  Will have her restart prolonged steroid taper and continue cough control measures.  Will also add on ICS nebs with budesonide to hopefully prevent recurrence and limit oral steroid use.  Side effect profile reviewed.  Educated on proper use.  Action plan in place.  Strict return precautions.  Patient Instructions  Continue Albuterol inhaler 2 puffs or 3 mL neb every 6 hours as needed for shortness of breath or wheezing. Notify if symptoms persist despite rescue inhaler/neb use. Use nebs three times a day until symptoms improve  Continue Breztri 2 puffs Twice daily. Brush tongue and rinse mouth afterwards Continue flonase nasal spray 2 sprays each nostril daily Continue claritin 1 tab daily Continue Tezspire every 28 days as prescribed  Continue pantoprazole 1 tab Twice daily    -Prednisone taper. 4 tabs for 5 days, then 3 tabs for 5 days, 2 tabs for 5 days, then 1 tab for 5 days, then stop. Take in AM with food. Start tomorrow -Continue Hycodan 5 mL every 6 hours as needed for severe coughing. Do not drive after taking. May cause drowsiness. Do not take with other sedating medications -Benzonatate 1 capsule Three times a day for cough. Use consistently over the next week or so  -Budesonide 2 mL neb Twice daily until symptoms improve then as needed for shortness of breath/wheezing/cough    Flu shot today  I will call you with your CT results    Follow up in 4-6 weeks with Dr. Delton Coombes (1st) or Katie Aamira Bischoff,NP. If symptoms do not improve or worsen, please contact office for sooner follow up or seek emergency care.

## 2023-08-26 ENCOUNTER — Other Ambulatory Visit: Payer: Self-pay | Admitting: Nurse Practitioner

## 2023-08-26 DIAGNOSIS — R918 Other nonspecific abnormal finding of lung field: Secondary | ICD-10-CM

## 2023-08-26 DIAGNOSIS — D869 Sarcoidosis, unspecified: Secondary | ICD-10-CM

## 2023-09-01 ENCOUNTER — Other Ambulatory Visit: Payer: Self-pay | Admitting: Allergy and Immunology

## 2023-10-12 ENCOUNTER — Ambulatory Visit (INDEPENDENT_AMBULATORY_CARE_PROVIDER_SITE_OTHER): Payer: 59 | Admitting: Allergy and Immunology

## 2023-10-12 VITALS — BP 122/80 | HR 87 | Temp 98.8°F | Resp 14 | Ht 63.98 in | Wt 154.6 lb

## 2023-10-12 DIAGNOSIS — J4550 Severe persistent asthma, uncomplicated: Secondary | ICD-10-CM

## 2023-10-12 DIAGNOSIS — J849 Interstitial pulmonary disease, unspecified: Secondary | ICD-10-CM

## 2023-10-12 DIAGNOSIS — J479 Bronchiectasis, uncomplicated: Secondary | ICD-10-CM

## 2023-10-12 DIAGNOSIS — J3089 Other allergic rhinitis: Secondary | ICD-10-CM | POA: Diagnosis not present

## 2023-10-12 DIAGNOSIS — D860 Sarcoidosis of lung: Secondary | ICD-10-CM

## 2023-10-12 DIAGNOSIS — K219 Gastro-esophageal reflux disease without esophagitis: Secondary | ICD-10-CM

## 2023-10-12 MED ORDER — LORATADINE 10 MG PO TABS: 10.0000 mg | ORAL_TABLET | Freq: Every day | ORAL | 1 refills | Status: DC | PRN

## 2023-10-12 MED ORDER — BREZTRI AEROSPHERE 160-9-4.8 MCG/ACT IN AERO: INHALATION_SPRAY | RESPIRATORY_TRACT | 3 refills | Status: AC

## 2023-10-12 MED ORDER — ALBUTEROL SULFATE HFA 108 (90 BASE) MCG/ACT IN AERS: 2.0000 | INHALATION_SPRAY | RESPIRATORY_TRACT | 1 refills | Status: DC | PRN

## 2023-10-12 MED ORDER — FLUTICASONE PROPIONATE 50 MCG/ACT NA SUSP
1.0000 | Freq: Every day | NASAL | 1 refills | Status: AC
Start: 2023-10-12 — End: ?

## 2023-10-12 NOTE — Patient Instructions (Addendum)
  1.  Continue to perform allergen avoidance measures - house dust mite  2.  Continue to treat inflammation of airway:   A.  Breztri - 2 inhalations 2 times a day (Empty Lungs) B.  Flonase 1 spray each nostril only 1 time per day.    C.  Tezepelumab injections every 4 weeks   3.  Continue to treat reflux/ LPR:   A.  Eliminate use of fish oil, caffeine, chocolate  B.  Raise head of bed  C.  No late meals  D.  Replace throat clearing with swallowing/drinking maneuver  E.  Pantoprazole 40 mg twice a day  F.  OTC Famotidine 20 mg  two tablets in evening  5.  If needed:   A.  OTC antihistamine   B.  Albuterol HFA-2 inhalations once a day  6. Will reapply for percussion vest insurance approval  7. Can budesonide 0.5 mg + Duoneb nebulization every 6 hours if needed  8. Influenza = Tamiflu. Covid = Paxlovid  9. Return to clinic in summer 2025 or earlier if problem

## 2023-10-12 NOTE — Progress Notes (Unsigned)
 [place] [facility] [place] - [place] - [place] - [place]   Follow-up Note  Referring Provider: Elie Confer, NP Primary Provider: Charlane Ferretti, DO Date of Office Visit: 10/12/2023  Subjective:   [name] (DOB: 19-Jul-1956) is a 68 y.o. female who [facility] in re-evaluation of the following:  HPI: [name] returns to this clinic in evaluation of severe asthma, sarcoidosis with ILD, rhinitis, LPR.  I last saw her in this clinic 06 April 2023.  She was doing relatively well using tezepelumab injections along with a collection of anti-inflammatory agents for her airway but she underwent several episodes of viral infections this winter.  She has been treated with 3 courses of systemic steroids and 2 antibiotics and yet still continues to have some problems with lingering cough and feeling as though her chest is full of phlegm.  She is certainly better than she was as her baseline prior to starting tezepelumab but she is not as good as she was when she initially started this medication.  There is still question of whether or not she has intermittent activity of her sarcoid and sometimes she is treated with a prolonged systemic steroid course to address that issue.  She has very little problems with her nose.  She thinks her reflux is doing well yet she still continues to have lots of postnasal drip and lots of throat clearing.  She has obtained this years flu vaccine and pneumonia vaccine.  Allergies as of 10/12/2023   No Known Allergies      Medication List    albuterol 108 (90 Base) MCG/ACT inhaler Commonly known as: VENTOLIN HFA Inhale 2 puffs into the lungs every 4 (four) hours as needed for wheezing or shortness of breath.   albuterol (2.5 MG/3ML) 0.083% nebulizer solution Commonly known as: PROVENTIL Take 3 mLs (2.5 mg total) by nebulization every 4 (four) hours as needed for wheezing or shortness of breath.    aspirin 81 MG tablet Take 81 mg by mouth daily.   atorvastatin 80 MG tablet Commonly known as: LIPITOR Take 1 tablet (80 mg total) by mouth daily.   benzonatate 200 MG capsule Commonly known as: TESSALON Take 1 capsule (200 mg total) by mouth 3 (three) times daily as needed for cough.   Breztri Aerosphere 160-9-4.8 MCG/ACT Aero Generic drug: Budeson-Glycopyrrol-Formoterol USE 2 INHALATIONS BY MOUTH TWICE DAILY TO PREVENT COUGH OR  WHEEZE. RINSE MOUTH AFTER USE   budesonide 0.5 MG/2ML nebulizer solution Commonly known as: Pulmicort Take 2 mLs (0.5 mg total) by nebulization in the morning and at bedtime.   fenofibrate 160 MG tablet Take 160 mg by mouth daily.   fluticasone 50 MCG/ACT nasal spray Commonly known as: FLONASE Place 1 spray into both nostrils daily.   HYDROcodone bit-homatropine 5-1.5 MG/5ML syrup Commonly known as: HYCODAN Take 5 mLs by mouth every 6 (six) hours as needed for cough.   loratadine 10 MG tablet Commonly known as: CLARITIN Take 1 tablet (10 mg total) by mouth daily.   losartan-hydrochlorothiazide 50-12.5 MG tablet Commonly known as: HYZAAR Take 1 tablet by mouth daily.   multivitamin capsule Take 1 capsule by mouth daily.   pantoprazole 40 MG tablet Commonly known as: PROTONIX TAKE 1 TABLET BY MOUTH TWICE  DAILY   Tezspire 210 MG/1. Soaj Generic drug: Tezepelumab-ekko INJECT 210MG  SUBCUTANEOUSLY  EVERY 4 WEEKS   vitamin C 1000 MG tablet Take 1,000 mg by mouth daily.   Vitamin D (Ergocalciferol) 1.25  MG (50000 UNIT) Caps capsule Commonly known as: DRISDOL Take 50,000 Units by mouth every 7 (seven) days.    Past Medical History:  Diagnosis Date   Anemia    Angio-edema    Arthritis    Asthma 11/06/2022   Cataract    Chronic sinusitis    Cough    Diabetes mellitus without complication (HCC)    Diverticulosis 2009   mild sigmoid   GERD (gastroesophageal reflux disease)    Glaucoma    High cholesterol    Hx of adenomatous  and sessile serrated colonic polyps 09/04/2022   12/23 1 diminutive ssp and 1 diminutive adenoma (and 1 distal hpp) - recall 2030-31   Hypertension    Insomnia    Recurrent upper respiratory infection (URI)    Sarcoidosis    Vitamin D deficiency     Past Surgical History:  Procedure Laterality Date   COLONOSCOPY  2009   LYMPHADENECTOMY     NOSE SURGERY     SINOSCOPY     TUBAL LIGATION     VESICOVAGINAL FISTULA CLOSURE W/ TAH     VIDEO BRONCHOSCOPY Bilateral 07/29/2017   Procedure: VIDEO BRONCHOSCOPY WITH FLUORO;  Surgeon: Leslye Peer, MD;  Location: [facility];  Service: Cardiopulmonary;  Laterality: Bilateral;    Review of systems negative except as noted in HPI / PMHx or noted below:  Review of Systems  Constitutional: Negative.   HENT: Negative.    Eyes: Negative.   Respiratory: Negative.    Cardiovascular: Negative.   Gastrointestinal: Negative.   Genitourinary: Negative.   Musculoskeletal: Negative.   Skin: Negative.   Neurological: Negative.   Endo/Heme/Allergies: Negative.   Psychiatric/Behavioral: Negative.       Objective:   Vitals:   10/12/23 1632  BP: 122/80  Pulse: 87  Resp: 14  Temp: 98.8 F (37.1 C)  SpO2: 96%   Height: 5' 3.98" (162.5 cm)  Weight: 154 lb 9.6 oz (70.1 kg)   Physical Exam Constitutional:      Appearance: She is not diaphoretic.     Comments: Throat clearing  HENT:     Head: Normocephalic.     Right Ear: Tympanic membrane, ear canal and external ear normal.     Left Ear: Tympanic membrane, ear canal and external ear normal.     Nose: Nose normal. No mucosal edema (septal perf) or rhinorrhea.     Mouth/Throat:     Pharynx: Uvula midline. No oropharyngeal exudate.  Eyes:     Conjunctiva/sclera: Conjunctivae normal.  Neck:     Thyroid: No thyromegaly.     Trachea: Trachea normal. No tracheal tenderness or tracheal deviation.  Cardiovascular:     Rate and Rhythm: Normal rate and regular rhythm.     Heart sounds:  Normal heart sounds, S1 normal and S2 normal. No murmur heard. Pulmonary:     Effort: No respiratory distress.     Breath sounds: Normal breath sounds. No stridor. No wheezing (Scattered wheezing and rhonchi predominantly right lung) or rales.  Lymphadenopathy:     Head:     Right side of head: No tonsillar adenopathy.     Left side of head: No tonsillar adenopathy.     Cervical: No cervical adenopathy.  Skin:    Findings: No erythema or rash.     Nails: There is no clubbing.  Neurological:     Mental Status: She is alert.     Diagnostics: Spirometry was performed and demonstrated an FEV1 of 1.18 at 57 %  of predicted.  Assessment and Plan:   1. Not well controlled severe persistent asthma   2. Sarcoidosis of lung (HCC)   3. Interstitial lung disease (HCC)   4. Perennial allergic rhinitis   5. LPRD (laryngopharyngeal reflux disease)    1.  Continue to perform allergen avoidance measures - house dust mite  2.  Continue to treat inflammation of airway:   A.  Breztri - 2 inhalations 2 times a day (Empty Lungs) B.  Flonase 1 spray each nostril only 1 time per day.    C.  Tezepelumab injections every 4 weeks   3.  Continue to treat reflux/ LPR:   A.  Eliminate use of fish oil, caffeine, chocolate  B.  Raise head of bed  C.  No late meals  D.  Replace throat clearing with swallowing/drinking maneuver  E.  Pantoprazole 40 mg twice a day  F.  OTC Famotidine 20 mg  two tablets in evening  5.  If needed:   A.  OTC antihistamine   B.  Albuterol HFA-2 inhalations once a day  6. Will reapply for percussion vest insurance approval  7. Can budesonide 0.5 mg + Duoneb nebulization every 6 hours if needed  8. Influenza = Tamiflu. Covid = Paxlovid  9. Return to clinic in summer 2025 or earlier if problem  Alisea probably has a lot of mucus stuck in her lungs from all of her viral respiratory tract infections to this winter and I will once again attempt to get approval for a  percussion vest from her insurance company to help dislodge that mucous plugging.  She also has a lot of throat clearing which is probably related to her LPR as her reflux is just awful and requires a fair amount of therapy to get it under marginal control.  I did have a talk with her today about replacing her throat clearing with a swallowing or drinking maneuver.  She can add in budesonide and DuoNeb nebulizations to her collection of anti-inflammatory agents for airway including the use of anti-TSL P antibody as noted above.  I will see her back in this clinic in summer 2025 or earlier if there is a problem.  Laurette Schimke, MD Allergy / Immunology [facility]

## 2023-10-13 ENCOUNTER — Encounter: Payer: Self-pay | Admitting: Allergy and Immunology

## 2023-10-13 ENCOUNTER — Telehealth: Payer: Self-pay

## 2023-10-13 NOTE — Telephone Encounter (Signed)
-----   Message from ERIC J KOZLOW sent at 10/13/2023  7:01 AM EST ----- Can we have a [employer] or similar provide [name] a percussion vest for bronchiectasis?  Lets see if we can get her a vest in the next 2 weeks.

## 2023-10-13 NOTE — Telephone Encounter (Addendum)
 Printed the form for [employer]. I am working on getting it completed and faxed with patient ov documents.    [phone]

## 2023-10-15 ENCOUNTER — Encounter: Payer: Self-pay | Admitting: Emergency Medicine

## 2023-10-15 ENCOUNTER — Ambulatory Visit: Payer: 59 | Admitting: Emergency Medicine

## 2023-10-15 VITALS — BP 130/81 | HR 83 | Temp 98.5°F

## 2023-10-15 DIAGNOSIS — R053 Chronic cough: Secondary | ICD-10-CM | POA: Diagnosis not present

## 2023-10-15 DIAGNOSIS — J4550 Severe persistent asthma, uncomplicated: Secondary | ICD-10-CM

## 2023-10-15 DIAGNOSIS — D869 Sarcoidosis, unspecified: Secondary | ICD-10-CM | POA: Diagnosis not present

## 2023-10-15 DIAGNOSIS — J45909 Unspecified asthma, uncomplicated: Secondary | ICD-10-CM | POA: Diagnosis not present

## 2023-10-15 NOTE — Patient Instructions (Signed)
 We will work on getting her CT scan of the chest moved up sooner so we can correlate your imaging with your symptoms Please continue your Breztri 2 puffs twice a day, rinse gargle after using Continue your budesonide nebulizer treatments every morning.  Rinse and gargle after using Continue albuterol either 2 puffs or 1 nebulizer treatment up to every 4 hours if needed for shortness of breath Continue your loratadine and Flonase as you have been taking them Continue your Tezspire injections as directed by Dr. Lucie Leather Continue your pantoprazole 40 mg twice a day and your famotidine nightly.  Depending on how your call progresses we may need to send you back to see gastroenterology. Follow-up with Dr. Delton Coombes after your CT chest so we can review those results together

## 2023-10-15 NOTE — Assessment & Plan Note (Signed)
 Her CT chest from December did not show any significant changes, was not consistent with active sarcoid.  A repeat CT has been scheduled for March but we will try to do this sooner to correlate imaging with her cough.  As above she may need to be treated for sarcoid flare, possibly may even need maintenance therapy.  It is unclear whether her cough is due to either active sarcoid or her asthma.

## 2023-10-15 NOTE — Assessment & Plan Note (Signed)
 Continues to have persistent cough.  She feels reflux and chronic drainage even on good therapy for both.  She gets temporary relief on prednisone which suggest that she has persistent exacerbators present.  Need to try to correlate this with her sarcoidosis, CT chest.  Unclear that her sarcoid is flaring (she thinks this is mostly upper airway) but if so then we would consider treating with a prolonged course of steroids, possibly even need to consider chronic steroid sparing medication.  We will continue to work hard to control her exacerbating factors including GERD and chronic rhinitis.  We will work on getting her CT scan of the chest moved up sooner so we can correlate your imaging with your symptoms Continue your Tezspire injections as directed by Dr. Lucie Leather Continue your pantoprazole 40 mg twice a day and your famotidine nightly.  Depending on how your call progresses we may need to send you back to see gastroenterology. Follow-up with Dr. Delton Coombes after your CT chest so we can review those results together

## 2023-10-15 NOTE — Assessment & Plan Note (Signed)
 Please continue your Breztri 2 puffs twice a day, rinse gargle after using Continue your budesonide nebulizer treatments every morning.  Rinse and gargle after using Continue albuterol either 2 puffs or 1 nebulizer treatment up to every 4 hours if needed for shortness of breath

## 2023-10-15 NOTE — Progress Notes (Signed)
 HPI:    [name] 10/15/2023 --68 year old woman with stage IV sarcoidosis that includes widespread cavitary change and scarring on CT chest, left upper lobe aspergilloma and bronchiectasis.  She is persistent asthma, chronic cough in the setting of GERD and chronic rhinitis.  She is seeing Dr. Lucie Leather with allergy and is now on Tezspire (tezepelumab).  Has been seen in November and December, treated with prednisone tapers for persistent cough on both those occasions.  These may have been associated with viral infections, but she is unsure whether there was a precipitant.  The cough is persisted and she does have breakthrough reflux, frequent throat clearing. She clears mucous in the morning, also sometimes through She is on PPI bid, loratadine, flonase, Breztri. She is also on famotidine at bedtime. She is also doing pulmicort nebs qam - does seem to help her, decreases her cough.   Spirometry done 10/12/2023 reviewed by me shows severe obstruction with an FEV1 of 1.18 L (57% predicted)  EXAM :  Vitals:   10/15/23 0829  BP: 130/81  Pulse: 83  Temp: 98.5 F (36.9 C)  TempSrc: Oral  SpO2: 99%    Gen: Pleasant, well-nourished, in no distress,  normal affect.  Cough frequently  ENT: No lesions,  mouth clear,  oropharynx clear, no postnasal drip, strong voice  Neck: No JVD, no stridor  Lungs: No use of accessory muscles, bilateral inspiratory crackles at both bases.  No active wheezing  Cardiovascular: RRR, heart sounds normal, no murmur or gallops, no peripheral edema  Musculoskeletal: No deformities, no cyanosis or clubbing  Neuro: alert, non focal  Skin: Warm, no lesions or rash   Cough, persistent Continues to have persistent cough.  She feels reflux and chronic drainage even on good therapy for both.  She gets temporary relief on prednisone which suggest that she has persistent exacerbators present.  Need to try to correlate this with her sarcoidosis, CT chest.  Unclear that her sarcoid  is flaring (she thinks this is mostly upper airway) but if so then we would consider treating with a prolonged course of steroids, possibly even need to consider chronic steroid sparing medication.  We will continue to work hard to control her exacerbating factors including GERD and chronic rhinitis.  We will work on getting her CT scan of the chest moved up sooner so we can correlate your imaging with your symptoms Continue your Tezspire injections as directed by Dr. Lucie Leather Continue your pantoprazole 40 mg twice a day and your famotidine nightly.  Depending on how your call progresses we may need to send you back to see gastroenterology. Follow-up with Dr. Delton Coombes after your CT chest so we can review those results together  Sarcoidosis Her CT chest from December did not show any significant changes, was not consistent with active sarcoid.  A repeat CT has been scheduled for March but we will try to do this sooner to correlate imaging with her cough.  As above she may need to be treated for sarcoid flare, possibly may even need maintenance therapy.  It is unclear whether her cough is due to either active sarcoid or her asthma.  Asthma Please continue your Breztri 2 puffs twice a day, rinse gargle after using Continue your budesonide nebulizer treatments every morning.  Rinse and gargle after using Continue albuterol either 2 puffs or 1 nebulizer treatment up to every 4 hours if needed for shortness of breath    Levy Pupa, MD, PhD 10/15/2023, 8:56 AM [facility]  [phone] or if no answer [phone]

## 2023-10-19 NOTE — Telephone Encounter (Signed)
 Patient's [employer] percussion vest order has been completed and faxed to [employer] with patient's demographics and last office visit.   Fax Number : [phone]

## 2023-10-20 ENCOUNTER — Other Ambulatory Visit: Payer: 59

## 2023-10-28 ENCOUNTER — Ambulatory Visit
Admission: RE | Admit: 2023-10-28 | Discharge: 2023-10-28 | Disposition: A | Discharge status: Home / Self Care | Source: Ambulatory Visit | Attending: Nurse Practitioner | Admitting: Nurse Practitioner

## 2023-10-28 DIAGNOSIS — R918 Other nonspecific abnormal finding of lung field: Secondary | ICD-10-CM

## 2023-10-28 DIAGNOSIS — D869 Sarcoidosis, unspecified: Secondary | ICD-10-CM

## 2023-11-08 ENCOUNTER — Encounter: Payer: Self-pay | Admitting: Emergency Medicine

## 2023-11-08 DIAGNOSIS — D869 Sarcoidosis, unspecified: Secondary | ICD-10-CM

## 2023-11-08 DIAGNOSIS — J4550 Severe persistent asthma, uncomplicated: Secondary | ICD-10-CM

## 2023-11-09 MED ORDER — LEVOFLOXACIN 500 MG PO TABS: 500.0000 mg | ORAL_TABLET | Freq: Every day | ORAL | 0 refills | Status: DC

## 2023-11-09 MED ORDER — HYDROCODONE BIT-HOMATROP MBR 5-1.5 MG/5ML PO SOLN: 5.0000 mL | Freq: Four times a day (QID) | ORAL | 0 refills | Status: DC | PRN

## 2023-11-09 MED ORDER — PREDNISONE 10 MG PO TABS: ORAL_TABLET | ORAL | 0 refills | Status: DC

## 2023-11-09 NOTE — Telephone Encounter (Signed)
 Please let her know that her CT scan of the chest is stable compared with her prior which is good news.  I would like to treat her for a flare of bronchitis/bronchiectasis.  I sent prescriptions for prednisone, Levaquin and Hycodan to her pharmacy, [facility] [address]

## 2023-11-09 NOTE — Telephone Encounter (Signed)
 Asking fro refill on cough syrup. No coughing up "yellow creamy balls", no fever and has SOB but not increased since last visit.

## 2023-11-10 ENCOUNTER — Telehealth: Payer: Self-pay

## 2023-11-10 ENCOUNTER — Other Ambulatory Visit: Payer: Self-pay

## 2023-11-10 NOTE — Telephone Encounter (Signed)
 Called & spoke with patient, she verbalized understanding & had no concerns. Nfn

## 2023-11-10 NOTE — Telephone Encounter (Signed)
 Spoke with patient regarding Dr. Neville Route note. Pt verbalized understanding, nothing further needed.   Please let her know that her CT scan of the chest is stable compared with her prior which is good news.   I would like to treat her for a flare of bronchitis/bronchiectasis.  I sent prescriptions for prednisone, Levaquin and Hycodan to her pharmacy, [facility] [address]

## 2023-11-15 ENCOUNTER — Other Ambulatory Visit: Payer: 59

## 2023-11-26 ENCOUNTER — Encounter: Payer: Self-pay | Admitting: Emergency Medicine

## 2023-11-26 ENCOUNTER — Ambulatory Visit: Payer: 59 | Admitting: Emergency Medicine

## 2023-11-26 VITALS — BP 130/84 | HR 63 | Ht 65.0 in | Wt 158.0 lb

## 2023-11-26 DIAGNOSIS — D869 Sarcoidosis, unspecified: Secondary | ICD-10-CM

## 2023-11-26 DIAGNOSIS — J4550 Severe persistent asthma, uncomplicated: Secondary | ICD-10-CM

## 2023-11-26 DIAGNOSIS — J479 Bronchiectasis, uncomplicated: Secondary | ICD-10-CM | POA: Insufficient documentation

## 2023-11-26 DIAGNOSIS — K219 Gastro-esophageal reflux disease without esophagitis: Secondary | ICD-10-CM | POA: Diagnosis not present

## 2023-11-26 DIAGNOSIS — J310 Chronic rhinitis: Secondary | ICD-10-CM | POA: Diagnosis not present

## 2023-11-26 MED ORDER — AZITHROMYCIN 250 MG PO TABS: 250.0000 mg | ORAL_TABLET | Freq: Every day | ORAL | 3 refills | Status: DC

## 2023-11-26 MED ORDER — CEFUROXIME AXETIL 250 MG PO TABS: 250.0000 mg | ORAL_TABLET | Freq: Two times a day (BID) | ORAL | 3 refills | Status: DC

## 2023-11-26 MED ORDER — DOXYCYCLINE HYCLATE 100 MG PO TABS: 100.0000 mg | ORAL_TABLET | Freq: Two times a day (BID) | ORAL | 3 refills | Status: DC

## 2023-11-26 NOTE — Assessment & Plan Note (Signed)
Continue pantoprazole as you have been taking

## 2023-11-26 NOTE — Patient Instructions (Addendum)
 Please continue your Breztri 2 puffs twice a day Agree with stopping Pulmicort nebs Keep albuterol available use 2 puffs when needed for shortness of breath, chest tightness, wheezing. Continue loratadine and fluticasone nasal spray as you have been taking Continue pantoprazole as you have been taking We will start rotating antibiotics for you to use during the first week of alternating months: -First month azithromycin 250 mg daily for 5 days -Second month doxycycline 100 mg twice a day for 5 days -Third month cefuroxime 250 mg twice a day for 5 days - Then start the rotation over in the fourth month Start using a flutter valve once daily (10 breaths) We will repeat your CT scan of the chest in March 2026.  If you have flaring symptoms or if we are concerned about active sarcoidosis then we will repeat your CT scan sooner. Follow Dr. Delton Coombes in 6 months, call sooner if you have any problems.

## 2023-11-26 NOTE — Assessment & Plan Note (Signed)
 Associated with sarcoidosis with a recent flare in conjunction with what sounds like a bronchitis/bronchiectasis flare.  Now improved.  Plan to continue her BD regimen, control exacerbating factors including GERD and rhinitis  Please continue your Breztri 2 puffs twice a day Agree with stopping Pulmicort nebs Keep albuterol available use 2 puffs when needed for shortness of breath, chest tightness, wheezing.

## 2023-11-26 NOTE — Progress Notes (Signed)
 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  She has GERD and allergies, is on Tezspire (Dr Lucie Leather).  I saw her in February when she was having flaring symptoms, persistent cough, likely a combination of both her upper airway instability but also possibly her sarcoidosis.  I treated her underlying contributors upper airway irritation but her cough, purulent sputum continued so treated her for an acute flare of bronchiectasis with prednisone, Levaquin, Hycodan.  CT scan of the chest was done 10/28/2023 as below.  She is improved after recent flare. Just finished pred 2 days. Her cough is much better, no more purulent sputum. She has increased energy.   CT scan of the chest 10/28/2023 reviewed by me, showed no change in mediastinal and hilar adenopathy with matted nodes present.  No change in multiple cavitary lesions in the right upper lobe with associated bronchiectasis, stable left upper lobe cavity with a large intralesional soft tissue lesion consistent with a fungal ball.  Left upper lobe, right middle lobe bronchiectatic and atelectatic change.  No new nodules or consolidations.  EXAM :  Vitals:   11/26/23 0852  BP: 130/84  Pulse: 63  SpO2: 97%  Weight: 158 lb (71.7 kg)  Height: 5\' 5"  (1.651 m)    Gen: Pleasant, well-nourished, in no distress,  normal affect.  Cough frequently  ENT: No lesions,  mouth clear,  oropharynx clear, no postnasal drip, strong voice  Neck: No JVD, no stridor  Lungs: No use of accessory muscles, bilateral inspiratory crackles at both bases.  No active wheezing  Cardiovascular: RRR, heart sounds normal, no murmur or gallops, no peripheral edema  Musculoskeletal: No deformities, no cyanosis or clubbing  Neuro: alert, non focal  Skin: Warm, no lesions or  rash   Sarcoidosis Her CT chest shows stable chronic changes from her sarcoidosis including the left upper lobe mycetoma.  No new inflammatory change or ILD.  I think her recent flaring represents an acute exacerbation of bronchiectasis/bronchitis as opposed to active sarcoid.  I will hold off on scheduled immunosuppressive regimen at this time, continue to follow her serial imaging.  Asthma Associated with sarcoidosis with a recent flare in conjunction with what sounds like a bronchitis/bronchiectasis flare.  Now improved.  Plan to continue her BD regimen, control exacerbating factors including GERD and rhinitis  Please continue your Breztri 2 puffs twice a day Agree with stopping Pulmicort nebs Keep albuterol available use 2 puffs when needed for shortness of breath, chest tightness, wheezing.   Rhinitis, chronic Continue loratadine and fluticasone nasal spray as you have been taking  Gastroesophageal reflux disease without esophagitis Continue pantoprazole as you have been taking  Bronchiectasis without complication (HCC) Due to scarring and parenchymal distortion from her sarcoidosis.  She had a recent flare that improved with prednisone and levofloxacin.  I think she needs to be on a more dedicated secretion clearance maintenance regimen and we will start flutter valve on a schedule.  I will also start her on rotating antibiotics to try and decrease the frequency with which she flares.  We will start rotating antibiotics for you to use during the first week of alternating months: -First month azithromycin 250 mg daily for 5 days -Second month doxycycline 100 mg twice a day for 5 days -Third month cefuroxime 250 mg twice a day for 5 days - Then start  the rotation over in the fourth month Start using a flutter valve once daily (10 breaths)   Time spent 42 minutes  Levy Pupa, MD, PhD 11/26/2023, 12:17 PM [facility] [phone] or if no answer [phone]

## 2023-11-26 NOTE — Assessment & Plan Note (Signed)
 Her CT chest shows stable chronic changes from her sarcoidosis including the left upper lobe mycetoma.  No new inflammatory change or ILD.  I think her recent flaring represents an acute exacerbation of bronchiectasis/bronchitis as opposed to active sarcoid.  I will hold off on scheduled immunosuppressive regimen at this time, continue to follow her serial imaging.

## 2023-11-26 NOTE — Assessment & Plan Note (Signed)
 Due to scarring and parenchymal distortion from her sarcoidosis.  She had a recent flare that improved with prednisone and levofloxacin.  I think she needs to be on a more dedicated secretion clearance maintenance regimen and we will start flutter valve on a schedule.  I will also start her on rotating antibiotics to try and decrease the frequency with which she flares.  We will start rotating antibiotics for you to use during the first week of alternating months: -First month azithromycin 250 mg daily for 5 days -Second month doxycycline 100 mg twice a day for 5 days -Third month cefuroxime 250 mg twice a day for 5 days - Then start the rotation over in the fourth month Start using a flutter valve once daily (10 breaths)

## 2023-11-26 NOTE — Assessment & Plan Note (Signed)
 Continue loratadine and fluticasone nasal spray as you have been taking

## 2024-01-04 ENCOUNTER — Other Ambulatory Visit: Payer: Self-pay | Admitting: Nurse Practitioner

## 2024-02-07 ENCOUNTER — Telehealth: Payer: Self-pay | Admitting: *Deleted

## 2024-02-07 NOTE — Telephone Encounter (Signed)
 Patient called to discuss Tezspire  and affordablilty since she now has MCR starting this month. I discussed PAP vs OOP for the cost of medication. She is going to think about it and reach back out to me

## 2024-05-15 ENCOUNTER — Telehealth: Payer: Self-pay

## 2024-05-15 NOTE — Telephone Encounter (Signed)
 Copied from CRM [id number]. Topic: Clinical - Medication Question >> May 15, 2024 11:14 AM Russell PARAS wrote: Reason for CRM:  Pt is needing prescription for COVID immunization CB# [phone]  Called and spoke with the patient and advised 65+ do not need prescription.  Pt states she went to [facility] and they denied her receiving vaccine since she didn't have prescription.  Advised pt I would call pharmacy as this is the correct information and she does not need prescription.

## 2024-06-01 ENCOUNTER — Ambulatory Visit: Admitting: Emergency Medicine

## 2024-06-01 ENCOUNTER — Encounter: Payer: Self-pay | Admitting: Emergency Medicine

## 2024-06-01 VITALS — BP 126/80 | HR 72 | Temp 98.4°F | Ht 65.0 in | Wt 152.4 lb

## 2024-06-01 DIAGNOSIS — J310 Chronic rhinitis: Secondary | ICD-10-CM

## 2024-06-01 DIAGNOSIS — K219 Gastro-esophageal reflux disease without esophagitis: Secondary | ICD-10-CM

## 2024-06-01 DIAGNOSIS — J4550 Severe persistent asthma, uncomplicated: Secondary | ICD-10-CM

## 2024-06-01 DIAGNOSIS — J479 Bronchiectasis, uncomplicated: Secondary | ICD-10-CM | POA: Diagnosis not present

## 2024-06-01 DIAGNOSIS — R053 Chronic cough: Secondary | ICD-10-CM

## 2024-06-01 DIAGNOSIS — D869 Sarcoidosis, unspecified: Secondary | ICD-10-CM

## 2024-06-01 NOTE — Assessment & Plan Note (Signed)
 Continue to treat underlying asthma, GERD, rhinitis as aggressively as possible.

## 2024-06-01 NOTE — Assessment & Plan Note (Signed)
Continue your pantoprazole as you have been taking it 

## 2024-06-01 NOTE — Assessment & Plan Note (Signed)
 Please continue Breztri  2 puffs twice a day.  Rinse and gargle after using. Use albuterol  2 puffs if needed for shortness of breath, chest tightness, wheezing.

## 2024-06-01 NOTE — Patient Instructions (Signed)
 We will continue your rotating antibiotics as you have been taking them for the first week of the month. Please continue Breztri  2 puffs twice a day.  Rinse and gargle after using. Use albuterol  2 puffs if needed for shortness of breath, chest tightness, wheezing. Please continue your loratadine  and fluticasone  nasal spray as you have been taking them. Continue your pantoprazole  as you have been taking it. We will perform a repeat CT scan of your chest and also a CT scan of your neck to follow for any evidence of evolving sarcoidosis. Follow Dr. Shelah in about 6 weeks so we can review your imaging and discuss results.

## 2024-06-01 NOTE — Assessment & Plan Note (Signed)
 Overall clinically stable although she has noticed new submandibular lymphadenopathy on the left which is palpable on exam.  Also has some left upper extremity radicular pain.  Need to consider other manifestations of sarcoidosis beyond pulmonary.  I think given these findings is reasonable to start with a CT scan of the neck, CT scan of the chest to ensure stability there.  If she does develop recurrent rash then she will see dermatology and they would consider biopsy.  We will perform a repeat CT scan of your chest and also a CT scan of your neck to follow for any evidence of evolving sarcoidosis. Follow Dr. Shelah in about 6 weeks so we can review your imaging and discuss results.

## 2024-06-01 NOTE — Assessment & Plan Note (Signed)
 We will continue your rotating antibiotics as you have been taking them for the first week of the month.

## 2024-06-01 NOTE — Progress Notes (Signed)
 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  She has GERD and allergies, is on Tezspire  (Dr Maurilio).  I saw her in February when she was having flaring symptoms, persistent cough, likely a combination of both her upper airway instability but also possibly her sarcoidosis.  I treated her underlying contributors upper airway irritation but her cough, purulent sputum continued so treated her for an acute flare of bronchiectasis with prednisone , Levaquin , Hycodan.  CT scan of the chest was done 10/28/2023 as below.  She is improved after recent flare. Just finished pred 2 days. Her cough is much better, no more purulent sputum. She has increased energy.   CT scan of the chest 10/28/2023 reviewed by me, showed no change in mediastinal and hilar adenopathy with matted nodes present.  No change in multiple cavitary lesions in the right upper lobe with associated bronchiectasis, stable left upper lobe cavity with a large intralesional soft tissue lesion consistent with a fungal ball.  Left upper lobe, right middle lobe bronchiectatic and atelectatic change.  No new nodules or consolidations.  [name] 06/01/2024 --follow-up visit for 68 year old woman with stage IV sarcoidosis and associated restrictive disease with cavitary changes.  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  She has associated asthma and bronchiectasis with chronic cough due to this as well as allergies and GERD.  She was on Tezspire  with Dr. Kozlow, but had to stop due to cost.  I have her on rotating antibiotics (azithromycin , doxycycline , cefuroxime ), flutter valve, Breztri , loratadine , fluticasone  nasal spray, pantoprazole . She had to miss a round of her abx due to no refills and she noticed more cough and congestion. Her breathing has been  overall stable. She is having difficulty exercising due to back issues. No new rash, no visual changes. She has experienced some radiating pain in her L arm. She has had an MRI of the neck that was unrevealing   EXAM :  Vitals:   06/01/24 0955  BP: 126/80  Pulse: 72  Temp: 98.4 F (36.9 C)  TempSrc: Oral  SpO2: 93%  Weight: 152 lb 6.4 oz (69.1 kg)  Height: 5' 5 (1.651 m)    Gen: Pleasant, well-nourished, in no distress,  normal affect.  Cough frequently  ENT: No lesions,  mouth clear,  oropharynx clear, no postnasal drip, strong voice  Neck: No JVD, small mobile submandibular lymph node palpated on the left.  No other nodes felt  Lungs: No use of accessory muscles, bilateral inspiratory crackles at both bases.  No active wheezing  Cardiovascular: RRR, heart sounds normal, no murmur or gallops, no peripheral edema  Musculoskeletal: No deformities, no cyanosis or clubbing  Neuro: alert, non focal  Skin: Warm, no lesions or rash   Bronchiectasis without complication (HCC) We will continue your rotating antibiotics as you have been taking them for the first week of the month.   Asthma Please continue Breztri  2 puffs twice a day.  Rinse and gargle after using. Use albuterol  2 puffs if needed for shortness of breath, chest tightness, wheezing.  Rhinitis, chronic Please continue your loratadine  and fluticasone  nasal spray as you have been taking them.  Gastroesophageal reflux disease without esophagitis Continue your pantoprazole  as you have been taking it.  Cough, persistent Continue to treat underlying asthma, GERD, rhinitis as aggressively as possible.  Sarcoidosis Overall clinically stable  although she has noticed new submandibular lymphadenopathy on the left which is palpable on exam.  Also has some left upper extremity radicular pain.  Need to consider other manifestations of sarcoidosis beyond pulmonary.  I think given these findings is reasonable to start with a CT  scan of the neck, CT scan of the chest to ensure stability there.  If she does develop recurrent rash then she will see dermatology and they would consider biopsy.  We will perform a repeat CT scan of your chest and also a CT scan of your neck to follow for any evidence of evolving sarcoidosis. Follow Dr. Shelah in about 6 weeks so we can review your imaging and discuss results.   Time spent 43 minutes  Lamar Shelah, MD, PhD 06/01/2024, 12:13 PM [facility] [phone] or if no answer [phone]

## 2024-06-01 NOTE — Assessment & Plan Note (Signed)
 Please continue your loratadine  and fluticasone  nasal spray as you have been taking them.

## 2024-06-08 ENCOUNTER — Ambulatory Visit
Admission: RE | Admit: 2024-06-08 | Discharge: 2024-06-08 | Disposition: A | Discharge status: Home / Self Care | Source: Ambulatory Visit | Attending: Emergency Medicine | Admitting: Emergency Medicine

## 2024-06-08 DIAGNOSIS — D869 Sarcoidosis, unspecified: Secondary | ICD-10-CM

## 2024-06-08 MED ORDER — IOPAMIDOL (ISOVUE-300) INJECTION 61%
75.0000 mL | Freq: Once | INTRAVENOUS | Status: AC | PRN
  Administered 2024-06-08: 75 mL via INTRAVENOUS

## 2024-06-28 ENCOUNTER — Encounter: Payer: Self-pay | Admitting: Emergency Medicine

## 2024-06-28 NOTE — Telephone Encounter (Signed)
 I reviewed her CT scan of the chest and her CT scan of the neck.  She does have some enlarged cervical lymph nodes which can sometimes be associated with activity of her sarcoid.  Her chest CT is stable.  It would be reasonable to discuss possible additional testing of her neck lymph nodes including possible biopsy to confirm that they do indeed relate to her sarcoidosis.  Please set her up with an office visit to see either RB or APP to discuss.  Thank you

## 2024-07-06 ENCOUNTER — Ambulatory Visit: Payer: Self-pay | Admitting: Adult Health

## 2024-07-06 ENCOUNTER — Encounter: Payer: Self-pay | Admitting: Adult Health

## 2024-07-06 VITALS — BP 128/80 | HR 72 | Ht 65.0 in | Wt 153.6 lb

## 2024-07-06 DIAGNOSIS — R918 Other nonspecific abnormal finding of lung field: Secondary | ICD-10-CM

## 2024-07-06 DIAGNOSIS — M2550 Pain in unspecified joint: Secondary | ICD-10-CM

## 2024-07-06 DIAGNOSIS — J310 Chronic rhinitis: Secondary | ICD-10-CM

## 2024-07-06 DIAGNOSIS — M549 Dorsalgia, unspecified: Secondary | ICD-10-CM

## 2024-07-06 DIAGNOSIS — J4550 Severe persistent asthma, uncomplicated: Secondary | ICD-10-CM

## 2024-07-06 DIAGNOSIS — J479 Bronchiectasis, uncomplicated: Secondary | ICD-10-CM

## 2024-07-06 DIAGNOSIS — R591 Generalized enlarged lymph nodes: Secondary | ICD-10-CM

## 2024-07-06 DIAGNOSIS — D860 Sarcoidosis of lung: Secondary | ICD-10-CM

## 2024-07-06 DIAGNOSIS — J45909 Unspecified asthma, uncomplicated: Secondary | ICD-10-CM

## 2024-07-06 DIAGNOSIS — D869 Sarcoidosis, unspecified: Secondary | ICD-10-CM

## 2024-07-06 MED ORDER — PREDNISONE 10 MG PO TABS: ORAL_TABLET | ORAL | 0 refills | Status: DC

## 2024-07-06 NOTE — Patient Instructions (Addendum)
 Begin Prednisone  20mg  daily for 1 week and then 10mg  every day for 1 week and then stop.  We will continue your rotating antibiotics as you have been taking them for the first week of the month. Please continue Breztri  2 puffs twice a day.  Rinse and gargle after using. Use albuterol  2 puffs if needed for shortness of breath, chest tightness, wheezing. Please continue your fluticasone  nasal spray as you have been taking them. Change Claritin  to Allegra 180mg  daily.  Add Chlorpheniramine 4mg  At bedtime  (Chlor tabs)  Continue your pantoprazole  as you have been taking it. Follow up with Dr. Shelah in 4-6 weeks and As needed   Please contact office for sooner follow up if symptoms do not improve or worsen or seek emergency care

## 2024-07-06 NOTE — Progress Notes (Signed)
 @Patient  ID: [name], female    DOB: April 30, 1956, 68 y.o.   MRN: [id number]  Chief Complaint  Patient presents with   Follow-up    CT f/u    Referring provider: Valentin Skates, DO  HPI: 68 yo female never smoker followed for stage IV sarcoidosis with interstitial lung disease-diffuse scarring and cavitary changes, left upper lobe cavitary lesion with aspergilloma, associated bronchiectasis.  Has asthma and allergies followed by allergy  team previously on Tezspire .     TEST/EVENTS : Reviewed 07/06/2024  CT scan of the chest 10/28/2023 reviewed by me, showed no change in mediastinal and hilar adenopathy with matted nodes present. No change in multiple cavitary lesions in the right upper lobe with associated bronchiectasis, stable left upper lobe cavity with a large intralesional soft tissue lesion consistent with a fungal ball. Left upper lobe, right middle lobe bronchiectatic and atelectatic change. No new nodules or consolidations.    Discussed the use of AI scribe software for clinical note transcription with the patient, who gave verbal consent to proceed.  History of Present Illness [name] is a 68 year old female with sarcoidosis and asthma who presents for 1 month follow-up.  Patient was seen last visit with new onset cervical lymphadenopathy.  She was set up for a CT chest and neck.   She has a history of stage IV sarcoidosis with significant lung scarring and cavitary lesions.  CT chest reviewed by myself on June 08, 2024-showed stable cavitary lesion in the left apex with nodular lesion within, thin-walled cystic lesions in the right lung apex, traction bronchiectasis and bibasilar honeycombing, partially calcified mediastinal and bilateral hilar lymph nodes CT soft tissue neck showed mild cervical lymphadenopathy largest right supraclavicular node measuring 18 x 11 x 12 mm.  Otherwise unrevealing.  She has noticed a palpable lymph nodes in her neck may have  decreased slightly but have not resolved totally.  She has no difficulty swallowing.  Does have chronic sinus drainage and postnasal drip.  Recently was seen at the dentist with no reportable dental issues.  Had her routine teeth cleaning.    She has asthma and is maintained on Breztri  twice daily.  She is treated for chronic bronchiectasis on antibiotic therapy  She is on a rotating antibiotic regimen, taking antibiotics the first week of each month. She has a persistent cough and sinus drainage, for which she takes Claritin  daily and uses Flonase .  He denies any rash.  She experiences body aches and has been evaluated by a gynecologist for breast pain radiating to her arm, but no axillary lymph node enlargement was found. She reports chronic sinus drainage and uses Claritin  and Flonase  for management.  She has a history of arthritis and is currently seeing an orthopedic specialist for severe arthritis in her back, which causes significant pain, especially at night. She has previously taken meloxicam but discontinued it due to kidney issues. She is currently taking Tylenol  for pain management.  No fever, sinus infection, dental issues, rash, oxygen use at home, or hemoptysis.      No Known Allergies  Immunization History  Administered Date(s) Administered   Fluad Trivalent(High Dose 65+) 08/12/2023   Influenza Split 06/21/2011, 05/23/2012, 05/24/2013   Influenza Whole 05/24/2010   Influenza,inj,Quad PF,6+ Mos 06/07/2015, 06/05/2016, 05/17/2017, 06/02/2018, 06/23/2019, 05/17/2020   Influenza-Unspecified 07/08/2022   Pneumococcal Conjugate-13 06/07/2015   Respiratory Syncytial Virus Vaccine ,Recomb Aduvanted(Arexvy ) 08/06/2022    Past Medical History:  Diagnosis Date   Anemia  Angio-edema    Arthritis    Asthma 11/06/2022   Cataract    Chronic sinusitis    Cough    Diabetes mellitus without complication (HCC)    Diverticulosis 2009   mild sigmoid   GERD (gastroesophageal reflux  disease)    Glaucoma    High cholesterol    Hx of adenomatous and sessile serrated colonic polyps 09/04/2022   12/23 1 diminutive ssp and 1 diminutive adenoma (and 1 distal hpp) - recall 2030-31   Hypertension    Insomnia    Recurrent upper respiratory infection (URI)    Sarcoidosis    Vitamin D deficiency     Tobacco History: Social History   Tobacco Use  Smoking Status Never   Passive exposure: Past  Smokeless Tobacco Never   Counseling given: Not Answered   Outpatient Medications Prior to Visit  Medication Sig Dispense Refill   albuterol  (PROVENTIL ) (2.5 MG/3ML) 0.083% nebulizer solution Take 3 mLs (2.5 mg total) by nebulization every 4 (four) hours as needed for wheezing or shortness of breath. 150 mL 2   albuterol  (VENTOLIN  HFA) 108 (90 Base) MCG/ACT inhaler Inhale 2 puffs into the lungs every 4 (four) hours as needed for wheezing or shortness of breath. 18 g 1   Ascorbic Acid (VITAMIN C) 1000 MG tablet Take 1,000 mg by mouth daily.     aspirin 81 MG tablet Take 81 mg by mouth daily.     atorvastatin  (LIPITOR) 80 MG tablet Take 1 tablet (80 mg total) by mouth daily. 90 tablet 3   azithromycin  (ZITHROMAX ) 250 MG tablet Take 1 tablet (250 mg total) by mouth daily. 5 tablet 3   benzonatate  (TESSALON ) 200 MG capsule Take 1 capsule (200 mg total) by mouth 3 (three) times daily as needed for cough. 30 capsule 1   Budeson-Glycopyrrol-Formoterol  (BREZTRI  AEROSPHERE) 160-9-4.8 MCG/ACT AERO USE 2 INHALATIONS BY MOUTH TWICE DAILY TO PREVENT COUGH OR  WHEEZE. RINSE MOUTH AFTER USE 32.1 g 3   budesonide  (PULMICORT ) 0.5 MG/2ML nebulizer solution Take 2 mLs (0.5 mg total) by nebulization in the morning and at bedtime. (Patient taking differently: Take 0.5 mg by nebulization in the morning and at bedtime. As needed) 120 mL 12   cefUROXime  (CEFTIN ) 250 MG tablet Take 1 tablet (250 mg total) by mouth 2 (two) times daily with a meal. 10 tablet 3   doxycycline  (VIBRA -TABS) 100 MG tablet Take 1  tablet (100 mg total) by mouth 2 (two) times daily. 10 tablet 3   fenofibrate 160 MG tablet Take 160 mg by mouth daily.     fluticasone  (FLONASE ) 50 MCG/ACT nasal spray Place 1 spray into both nostrils daily. 48 g 1   HYDROcodone  bit-homatropine (HYCODAN) 5-1.5 MG/5ML syrup Take 5 mLs by mouth every 6 (six) hours as needed for cough. 240 mL 0   loratadine  (CLARITIN ) 10 MG tablet Take 1 tablet (10 mg total) by mouth daily as needed for allergies. 90 tablet 1   losartan-hydrochlorothiazide (HYZAAR) 50-12.5 MG per tablet Take 1 tablet by mouth daily.     Multiple Vitamin (MULTIVITAMIN) capsule Take 1 capsule by mouth daily.     pantoprazole  (PROTONIX ) 40 MG tablet TAKE 1 TABLET BY MOUTH TWICE  DAILY 180 tablet 3   Cholecalciferol 1.25 MG (50000 UT) capsule Take 1 capsule by mouth daily.     levofloxacin  (LEVAQUIN ) 500 MG tablet Take 1 tablet (500 mg total) by mouth daily. (Patient not taking: Reported on 07/06/2024) 7 tablet 0   predniSONE  (DELTASONE ) 10  MG tablet Take 40mg  daily for 3 days, then 30mg  daily for 3 days, then 20mg  daily for 3 days, then 10mg  daily for 3 days, then stop (Patient not taking: Reported on 07/06/2024) 30 tablet 0   TEZSPIRE  210 MG/1. SOAJ INJECT 210MG  SUBCUTANEOUSLY  EVERY 4 WEEKS (Patient not taking: Reported on 07/06/2024) 1.91 mL 11   Vitamin D, Ergocalciferol, (DRISDOL) 1.25 MG (50000 UNIT) CAPS capsule Take 50,000 Units by mouth every 7 (seven) days. (Patient not taking: Reported on 07/06/2024)     Facility-Administered Medications Prior to Visit  Medication Dose Route Frequency Provider Last Rate Last Admin   methylPREDNISolone  acetate (DEPO-MEDROL ) injection 80 mg  80 mg Intramuscular Once Cobb, Comer GAILS, NP         Review of Systems:   Constitutional:   No  weight loss, night sweats,  Fevers, chills, +fatigue, or  lassitude.  HEENT:   No headaches,  Difficulty swallowing,  Tooth/dental problems, or  Sore throat,                No sneezing, itching, ear  ache, nasal congestion, post nasal drip,   CV:  No chest pain,  Orthopnea, PND, swelling in lower extremities, anasarca, dizziness, palpitations, syncope.   GI  No heartburn, indigestion, abdominal pain, nausea, vomiting, diarrhea, change in bowel habits, loss of appetite, bloody stools.   Resp: .  No chest wall deformity  Skin: no rash or lesions.  GU: no dysuria, change in color of urine, no urgency or frequency.  No flank pain, no hematuria   MS:  No joint pain or swelling.  No decreased range of motion.  No back pain.    Physical Exam  BP 128/80   Pulse 72   Ht 5' 5 (1.651 m) Comment: Per pt  Wt 153 lb 9.6 oz (69.7 kg)   SpO2 100% Comment: RA  BMI 25.56 kg/m   GEN: A/Ox3; pleasant , NAD, well nourished    HEENT:  [place]/AT,   NOSE-clear, THROAT-clear, no lesions, no postnasal drip or exudate noted.   NECK:  Supple w/ fair ROM; no JVD; normal carotid impulses w/o bruits; no thyromegaly or nodules palpated; no lymphadenopathy.    RESP  Clear  P & A; w/o, wheezes/ rales/ or rhonchi. no accessory muscle use, no dullness to percussion  CARD:  RRR, no m/r/g, no peripheral edema, pulses intact, no cyanosis or clubbing.  GI:   Soft & nt; nml bowel sounds; no organomegaly or masses detected.   Musco: Warm bil, no deformities or joint swelling noted.   Neuro: alert, no focal deficits noted.    Skin: Warm, no lesions or rashes    Lab Results:Reviewed 07/06/2024   CBC   BNP No results found for: BNP  ProBNP No results found for: PROBNP  Imaging: CT CHEST WO CONTRAST Result Date: 06/11/2024 EXAM: CT CHEST WITHOUT CONTRAST 06/08/2024 11:30:00 AM TECHNIQUE: CT of the chest was performed without the administration of intravenous contrast. Multiplanar reformatted images are provided for review. Automated exposure control, iterative reconstruction, and/or weight based adjustment of the mA/kV was utilized to reduce the radiation dose to as low as reasonably achievable.  COMPARISON: CT of the chest dated 10/28/2023. CLINICAL HISTORY: Sarcoidosis. Patient with swollen lymph nodes, cough and SOB x 2 months; Hx Sarcoidosis, asthma, HTN on meds. FINDINGS: MEDIASTINUM: Heart: Moderate calcific coronary artery disease, stable in the interim. Pericardium is unremarkable. The central airways are clear. LYMPH NODES: Partially calcified mediastinal and bilateral hilar lymph nodes. No  axillary lymphadenopathy. LUNGS AND PLEURA: A cavitary lesion is again demonstrated medially within the left lung apex measuring approximately 4.7 x 3.7 x 3.4 cm, similar to the prior study. There is an ovoid nodular lesion again seen layering independently within it measuring approximately 3.6 x 2.1 x 2.4 cm, also similar to the prior exam. There are also thin walled cystic lesions again demonstrated posteriorly within the right lung apex. There is also traction bronchiectasis within the lungs bilaterally, more pronounced on the right. There is also bibasilar honeycombing, also worse on the right. No pleural effusion or pneumothorax. SOFT TISSUES/BONES: No acute abnormality of the bones or soft tissues. UPPER ABDOMEN: Limited images of the upper abdomen demonstrates no acute abnormality. IMPRESSION: 1. Stable cavitary lesion in the left lung apex with an ovoid nodular lesion within it, thin-walled cystic lesions in the right lung apex, traction bronchiectasis bilaterally (more pronounced on the right), and bibasilar honeycombing (worse on the right), consistent with sarcoidosis. 2. Partially calcified mediastinal and bilateral hilar lymph nodes, consistent with sarcoidosis. 3. Moderate calcific coronary artery disease. Electronically signed by: Evalene Coho MD 06/11/2024 08:00 AM EDT RP Workstation: [id number]   CT SOFT TISSUE NECK W CONTRAST Result Date: 06/11/2024 EXAM: CT NECK WITH CONTRAST 06/08/2024 11:42:59 AM TECHNIQUE: CT of the neck was performed with the administration of 75 mL of iopamidol   (ISOVUE -300) 61% injection. Multiplanar reformatted images are provided for review. Automated exposure control, iterative reconstruction, and/or weight based adjustment of the mA/kV was utilized to reduce the radiation dose to as low as reasonably achievable. COMPARISON: None available. CLINICAL HISTORY: Sarcoidosis. Patient with swollen lymph nodes, cough and SOB x 2 months. Hx Sarcoidosis, asthma, HTN on meds. FINDINGS: AERODIGESTIVE TRACT: No discrete mass. No edema. SALIVARY GLANDS: The parotid and submandibular glands are unremarkable. THYROID: Unremarkable. LYMPH NODES: There is mild cervical lymphadenopathy present. The largest lymph node is the right supraclavicular node seen on image 79 of series 3, measuring 18 x 11 x 12 mm. SOFT TISSUES: No mass or fluid collection. BRAIN, ORBITS, SINUSES AND MASTOIDS: No acute abnormality. LUNGS AND MEDIASTINUM: No acute abnormality. BONES: No focal bone abnormality. IMPRESSION: 1. Mild cervical lymphadenopathy, largest right supraclavicular node measuring 18 x 11 x 12 mm. Electronically signed by: Evalene Coho MD 06/11/2024 07:52 AM EDT RP Workstation: [id number]    Administration History     None          Latest Ref Rng & Units 01/13/2017    3:04 PM  PFT Results  FVC-Pre L 2.20   FVC-Predicted Pre % 76   FVC-Post L 2.16   FVC-Predicted Post % 75   Pre FEV1/FVC % % 83   Post FEV1/FCV % % 89   FEV1-Pre L 1.83   FEV1-Predicted Pre % 81   FEV1-Post L 1.91   DLCO uncorrected ml/min/mmHg 15.18   DLCO UNC% % 57   DLCO corrected ml/min/mmHg 14.74   DLCO COR %Predicted % 56   DLVA Predicted % 84   TLC L 3.70   TLC % Predicted % 70   RV % Predicted % 66     No results found for: NITRICOXIDE      No data to display              Assessment & Plan:   Assessment and Plan Assessment & Plan Stage IV Pulmonary sarcoidosis with lung fibrosis, bronchiectasis , cavitary lesions, and associated lymphadenopathy   Severe pulmonary  sarcoidosis presents with extensive lung fibrosis and cavitary lesions. CT  scan shows stable cavitary lesions and fibrosis, with calcified lymph nodes consistent with previous findings. Mild enlargement of cervical lymph nodes, particularly in the right supraclavicular region, possible related to sarcoidosis. No acute changes or bronchiectatic flare are noted on CT. Steroid challenge with Prednisone  is prescribed at 20 mg daily for 7 days, then 10 mg daily for 7 days . Check PFT on return.  If lymphadenopathy not improving could consider possible IR biopsy of the right supraclavicular node noted on recent CT neck.  Bronchiectasis -stable  Bronchiectasis  with no current flare. CT scan shows stable bronchiectasis with no increased infection or inflammation. She continues on rotating antibiotics and Breztri  inhaler, 2 puffs in the morning and 2 puffs in the evening.  Asthma   Asthma is managed with Breztri  inhaler, with no current exacerbation. Insurance does not cover Tezspire ,  She will continue Breztri  inhaler, 2 puffs in the morning and 2 puffs in the evening,   Chronic rhinitis with postnasal drip   Chronic postnasal drip She is currently using Claritin  and Flonase , Claritin  is changed to Allegra, and chlorpheniramine 4 mg is added at bedtime to manage symptoms.  Chronic back pain with severe arthritis   Chronic back pain with severe arthritis is managed with Tylenol . Previous use of meloxicam was discontinued due to kidney issues. An orthopedic specialist is involved in her care. A referral to rheumatology is made for further evaluation of arthritis with known Sarcoid.   Diffuse joint pain and stiffness, possible inflammatory arthritis   Diffuse joint pain and stiffness may be related to inflammatory arthritis  and a referral to rheumatology is made for further evaluation.  Plan  Patient Instructions  Begin Prednisone  20mg  daily for 1 week and then 10mg  every day for 1 week and then stop.   We will continue your rotating antibiotics as you have been taking them for the first week of the month. Please continue Breztri  2 puffs twice a day.  Rinse and gargle after using. Use albuterol  2 puffs if needed for shortness of breath, chest tightness, wheezing. Please continue your fluticasone  nasal spray as you have been taking them. Change Claritin  to Allegra 180mg  daily.  Add Chlorpheniramine 4mg  At bedtime  (Chlor tabs)  Continue your pantoprazole  as you have been taking it. Follow up with Dr. Shelah in 4-6 weeks and As needed   Please contact office for sooner follow up if symptoms do not improve or worsen or seek emergency care          Ryver Zadrozny, NP 07/06/2024  I spent 47    minutes dedicated to the care of this patient on the date of this encounter to include pre-visit review of records, face-to-face time with the patient discussing conditions above, post visit ordering of testing, clinical documentation with the electronic health record, making appropriate referrals as documented, and communicating necessary findings to members of the patients care team.

## 2024-07-12 NOTE — Progress Notes (Unsigned)
   LILLETTE Ileana Collet, PhD, LAT, ATC acting as a scribe for Artist Lloyd, MD.  Medora CROME Karan is a 68 y.o. female who presents to [facility] today for LBP x ***. Pt locates pain to ***  Radiating pain: LE numbness/tingling: LE weakness: Aggravates: Treatments tried:  Pertinent review of systems: ***  Relevant historical information: ***   Exam:  There were no vitals taken for this visit. General: Well Developed, well nourished, and in no acute distress.   MSK: ***    Lab and Radiology Results No results found for this or any previous visit (from the past 72 hours). No results found.     Assessment and Plan: 68 y.o. female with ***   PDMP not reviewed this encounter. No orders of the defined types were placed in this encounter.  No orders of the defined types were placed in this encounter.    Discussed warning signs or symptoms. Please see discharge instructions. Patient expresses understanding.   ***

## 2024-07-13 ENCOUNTER — Ambulatory Visit: Payer: Self-pay | Admitting: Family Medicine

## 2024-07-13 VITALS — BP 124/78 | HR 62 | Ht 65.0 in | Wt 154.0 lb

## 2024-07-13 DIAGNOSIS — G8929 Other chronic pain: Secondary | ICD-10-CM

## 2024-07-13 DIAGNOSIS — M5441 Lumbago with sciatica, right side: Secondary | ICD-10-CM

## 2024-07-13 DIAGNOSIS — M47816 Spondylosis without myelopathy or radiculopathy, lumbar region: Secondary | ICD-10-CM

## 2024-07-13 NOTE — Patient Instructions (Addendum)
Thank you for coming in today.   I've referred you to Physical Therapy.  Let us know if you don't hear from them in one week.   Check back in 2 months

## 2024-07-26 ENCOUNTER — Encounter: Payer: Self-pay | Admitting: Emergency Medicine

## 2024-07-26 ENCOUNTER — Ambulatory Visit: Payer: Self-pay | Admitting: Emergency Medicine

## 2024-07-26 VITALS — BP 128/82 | HR 68 | Temp 97.8°F | Ht 65.0 in | Wt 155.0 lb

## 2024-07-26 DIAGNOSIS — D869 Sarcoidosis, unspecified: Secondary | ICD-10-CM

## 2024-07-26 DIAGNOSIS — J4550 Severe persistent asthma, uncomplicated: Secondary | ICD-10-CM

## 2024-07-26 DIAGNOSIS — K219 Gastro-esophageal reflux disease without esophagitis: Secondary | ICD-10-CM

## 2024-07-26 DIAGNOSIS — J310 Chronic rhinitis: Secondary | ICD-10-CM

## 2024-07-26 DIAGNOSIS — J479 Bronchiectasis, uncomplicated: Secondary | ICD-10-CM

## 2024-07-26 NOTE — Assessment & Plan Note (Signed)
 CT scan of the chest stable.  We did confirm lymphadenopathy on her neck CT.  A neck nodal biopsy was how her initial diagnosis of sarcoidosis was made.  We will continue surveillance scans, next to be done in October 2026.

## 2024-07-26 NOTE — Assessment & Plan Note (Signed)
 Continue current rotating doxycycline , azithromycin , cefuroxime .  Continue flutter valve.  Bronchodilator regimen as below.

## 2024-07-26 NOTE — Assessment & Plan Note (Signed)
Continue loratadine and fluticasone\ nasal spray 

## 2024-07-26 NOTE — Assessment & Plan Note (Signed)
 Continue current PPI.  She reports good control

## 2024-07-26 NOTE — Patient Instructions (Addendum)
 We will plan to repeat your CT scan of the chest and your CT scan of the neck in October 2026. Continue your rotating monthly antibiotics as you have been using them Continue Breztri  2 puffs twice a day.  Rinse and gargle after using. Keep your albuterol  available to use 2 puffs or 1 nebulizer treatment when needed for shortness of breath, chest tightness, wheezing. Flu shot is up-to-date Continue your fluticasone  nasal spray and loratadine  as you have been taking them Continue pantoprazole  as you have been taking it Follow with T. Parrett, NP in 6 months.  Call sooner if you have any problems Follow with Dr. Shelah in October 2026 after your scan so we can review those together.

## 2024-07-26 NOTE — Assessment & Plan Note (Signed)
 Continue scheduled Breztri  and albuterol  as needed.  Flu shot up-to-date.

## 2024-07-26 NOTE — Progress Notes (Signed)
 HPI:    [name] 07/26/2024 --Ms. Skousen is 39 with a history of stage IV sarcoid and associated parenchymal abnormalities on CT scan of the chest.  She has obstructive and restrictive lung disease with bronchiectasis and cavitary lesions including a left upper lobe cavity with an aspergilloma.  She deals with chronic cough/bronchiectasis due to her obstructive lung disease as well as upper airway irritation syndrome in the setting of allergies and GERD.  Previously on Tezspire  but not currently.  I have had her on rotating azithromycin /doxycycline /cefuroxime  as well as flutter valve.  Medical regimen Breztri , loratadine , fluticasone  nasal spray, pantoprazole .  She has some breakthrough sinus drainage, HA, no GERD on her PPI   CT scan of the chest 06/08/2024 reviewed by me showed some partially calcified mediastinal and hilar lymph nodes largely unchanged, 4.7 cm cavitary lesion at the left apex with a stable mycetoma, thin-walled cystic lesions bilaterally unchanged with some associated traction bronchiectasis and basilar honeycomb change  CT scan of the neck 06/08/2024 reviewed by me showed normal parotid and submandibular glands, mild cervical lymphadenopathy, largest was a right supraclavicular node 18 x 11 x 12 mm  EXAM :  Vitals:   07/26/24 1312  BP: 128/82  Pulse: 68  Temp: 97.8 F (36.6 C)  SpO2: 96%  Weight: 155 lb (70.3 kg)  Height: 5' 5 (1.651 m)     Gen: Pleasant, well-nourished, in no distress,  normal affect.  Occasional cough  ENT: No lesions,  mouth clear,  oropharynx clear, no postnasal drip, strong voice  Neck: No JVD, small mobile submandibular lymph node palpated on the left.  No other nodes felt  Lungs: No use of accessory muscles, bilateral inspiratory crackles at both bases.  No active wheezing  Cardiovascular: RRR, heart sounds normal, no murmur or gallops, no peripheral edema  Musculoskeletal: No deformities, no cyanosis or clubbing  Neuro: alert, non  focal  Skin: Warm, no lesions or rash   Bronchiectasis without complication (HCC) Continue current rotating doxycycline , azithromycin , cefuroxime .  Continue flutter valve.  Bronchodilator regimen as below.  Asthma Continue scheduled Breztri  and albuterol  as needed.  Flu shot up-to-date.  Gastroesophageal reflux disease without esophagitis Continue current PPI.  She reports good control  Rhinitis, chronic Continue loratadine  and fluticasone  nasal spray  Sarcoidosis CT scan of the chest stable.  We did confirm lymphadenopathy on her neck CT.  A neck nodal biopsy was how her initial diagnosis of sarcoidosis was made.  We will continue surveillance scans, next to be done in October 2026.   I personally spent a total of 31 minutes in the care of the patient today including preparing to see the patient, getting/reviewing separately obtained history, performing a medically appropriate exam/evaluation, counseling and educating, placing orders, documenting clinical information in the EHR, independently interpreting results, and communicating results.   Lamar Chris, MD, PhD 07/26/2024, 1:35 PM [facility] [phone] or if no answer [phone]

## 2024-08-22 ENCOUNTER — Telehealth: Payer: Self-pay | Admitting: Emergency Medicine

## 2024-08-22 NOTE — Telephone Encounter (Signed)
 I also do not see any documentation. Closing encounter.

## 2024-08-22 NOTE — Telephone Encounter (Signed)
 Copied from CRM #[id number]. Topic: General - Other >> Aug 22, 2024 11:07 AM Benton KIDD wrote: Reason for CRM: patient calling because she received message from dr zaida to call at [phone] but patient sees dr byrum . I looked up number and it didn't pull up anything . It just said this is dr zaida calling from our pulmonary sounds like a scam call to look into . Patient didn't call number back . If this is with our office please give patient a call back  [phone]  Do not see any documentation of a call. Please contact patient if needed.

## 2024-08-23 ENCOUNTER — Ambulatory Visit: Payer: Self-pay | Admitting: Emergency Medicine

## 2024-09-10 ENCOUNTER — Other Ambulatory Visit: Payer: Self-pay | Admitting: Emergency Medicine

## 2024-09-12 ENCOUNTER — Ambulatory Visit: Admitting: Family Medicine

## 2024-09-19 ENCOUNTER — Other Ambulatory Visit: Payer: Self-pay

## 2024-09-19 MED ORDER — CEFUROXIME AXETIL 250 MG PO TABS: 250.0000 mg | ORAL_TABLET | Freq: Two times a day (BID) | ORAL | 3 refills | Status: AC

## 2024-09-19 MED ORDER — DOXYCYCLINE HYCLATE 100 MG PO TABS: 100.0000 mg | ORAL_TABLET | Freq: Two times a day (BID) | ORAL | 3 refills | Status: AC

## 2024-09-19 MED ORDER — AZITHROMYCIN 250 MG PO TABS: ORAL_TABLET | ORAL | 3 refills | Status: AC

## 2024-10-10 ENCOUNTER — Ambulatory Visit: Admitting: Family Medicine

## 2024-10-31 ENCOUNTER — Ambulatory Visit: Payer: Self-pay | Admitting: Rheumatology

## 2024-11-29 ENCOUNTER — Ambulatory Visit: Payer: Self-pay | Admitting: Rheumatology
